# Patient Record
Sex: Female | Born: 1955
Health system: Southern US, Community
[De-identification: ages and names within clinical notes are randomized; demographics above are authoritative.]

## PROBLEM LIST (undated history)

## (undated) DIAGNOSIS — M1990 Unspecified osteoarthritis, unspecified site: Secondary | ICD-10-CM

## (undated) DIAGNOSIS — R233 Spontaneous ecchymoses: Secondary | ICD-10-CM

## (undated) DIAGNOSIS — K5901 Slow transit constipation: Secondary | ICD-10-CM

## (undated) DIAGNOSIS — R001 Bradycardia, unspecified: Secondary | ICD-10-CM

## (undated) DIAGNOSIS — M797 Fibromyalgia: Secondary | ICD-10-CM

## (undated) DIAGNOSIS — R519 Headache, unspecified: Secondary | ICD-10-CM

## (undated) DIAGNOSIS — R609 Edema, unspecified: Secondary | ICD-10-CM

## (undated) DIAGNOSIS — K589 Irritable bowel syndrome without diarrhea: Secondary | ICD-10-CM

## (undated) DIAGNOSIS — E1121 Type 2 diabetes mellitus with diabetic nephropathy: Secondary | ICD-10-CM

## (undated) DIAGNOSIS — I9589 Other hypotension: Secondary | ICD-10-CM

## (undated) DIAGNOSIS — R51 Headache: Secondary | ICD-10-CM

## (undated) DIAGNOSIS — J4530 Mild persistent asthma, uncomplicated: Secondary | ICD-10-CM

## (undated) DIAGNOSIS — I83813 Varicose veins of bilateral lower extremities with pain: Secondary | ICD-10-CM

## (undated) DIAGNOSIS — M791 Myalgia, unspecified site: Secondary | ICD-10-CM

## (undated) DIAGNOSIS — R238 Other skin changes: Secondary | ICD-10-CM

## (undated) DIAGNOSIS — F419 Anxiety disorder, unspecified: Secondary | ICD-10-CM

## (undated) DIAGNOSIS — R911 Solitary pulmonary nodule: Secondary | ICD-10-CM

## (undated) DIAGNOSIS — Z87448 Personal history of other diseases of urinary system: Secondary | ICD-10-CM

## (undated) DIAGNOSIS — N83209 Unspecified ovarian cyst, unspecified side: Secondary | ICD-10-CM

## (undated) DIAGNOSIS — K219 Gastro-esophageal reflux disease without esophagitis: Secondary | ICD-10-CM

## (undated) DIAGNOSIS — I10 Essential (primary) hypertension: Secondary | ICD-10-CM

## (undated) DIAGNOSIS — D369 Benign neoplasm, unspecified site: Secondary | ICD-10-CM

## (undated) DIAGNOSIS — I219 Acute myocardial infarction, unspecified: Secondary | ICD-10-CM

## (undated) DIAGNOSIS — T7840XA Allergy, unspecified, initial encounter: Secondary | ICD-10-CM

## (undated) DIAGNOSIS — M79669 Pain in unspecified lower leg: Secondary | ICD-10-CM

## (undated) DIAGNOSIS — R600 Localized edema: Secondary | ICD-10-CM

## (undated) HISTORY — DX: Personal history of other diseases of urinary system: Z87.448

## (undated) HISTORY — DX: Unspecified ovarian cyst, unspecified side: N83.209

## (undated) HISTORY — DX: Gastro-esophageal reflux disease without esophagitis: K21.9

## (undated) HISTORY — DX: Other hypotension: I95.89

## (undated) HISTORY — DX: Headache: R51

## (undated) HISTORY — DX: Type 2 diabetes mellitus with diabetic nephropathy: E11.21

## (undated) HISTORY — DX: Myalgia, unspecified site: M79.10

## (undated) HISTORY — DX: Pain in unspecified lower leg: M79.669

## (undated) HISTORY — DX: Varicose veins of bilateral lower extremities with pain: I83.813

## (undated) HISTORY — DX: Other skin changes: R23.8

## (undated) HISTORY — DX: Bradycardia, unspecified: R00.1

## (undated) HISTORY — DX: Anxiety disorder, unspecified: F41.9

## (undated) HISTORY — DX: Headache, unspecified: R51.9

## (undated) HISTORY — DX: Fibromyalgia: M79.7

## (undated) HISTORY — PX: TONSILLECTOMY: SUR1361

## (undated) HISTORY — DX: Spontaneous ecchymoses: R23.3

## (undated) HISTORY — DX: Unspecified osteoarthritis, unspecified site: M19.90

## (undated) HISTORY — DX: Localized edema: R60.0

## (undated) HISTORY — PX: BUNIONECTOMY: SHX129

## (undated) HISTORY — DX: Mild persistent asthma, uncomplicated: J45.30

## (undated) HISTORY — DX: Morbid (severe) obesity due to excess calories: E66.01

## (undated) HISTORY — DX: Edema, unspecified: R60.9

## (undated) HISTORY — DX: Essential (primary) hypertension: I10

## (undated) HISTORY — PX: TUBAL LIGATION: SHX77

## (undated) HISTORY — PX: OTHER SURGICAL HISTORY: SHX169

## (undated) HISTORY — PX: CHOLECYSTECTOMY: SHX55

## (undated) HISTORY — DX: Allergy, unspecified, initial encounter: T78.40XA

## (undated) HISTORY — DX: Irritable bowel syndrome, unspecified: K58.9

## (undated) HISTORY — DX: Slow transit constipation: K59.01

## (undated) HISTORY — DX: Solitary pulmonary nodule: R91.1

## (undated) HISTORY — PX: GALLBLADDER SURGERY: SHX652

## (undated) HISTORY — PX: CARDIAC CATHETERIZATION: SHX172

## (undated) HISTORY — DX: Benign neoplasm, unspecified site: D36.9

## (undated) HISTORY — DX: Acute myocardial infarction, unspecified: I21.9

---

## 1998-03-13 ENCOUNTER — Other Ambulatory Visit: Admission: RE | Admit: 1998-03-13 | Discharge: 1998-03-13 | Payer: Self-pay | Admitting: Obstetrics and Gynecology

## 1998-03-16 ENCOUNTER — Ambulatory Visit (HOSPITAL_COMMUNITY): Admission: RE | Admit: 1998-03-16 | Discharge: 1998-03-16 | Payer: Self-pay | Admitting: Neurosurgery

## 1998-03-16 ENCOUNTER — Encounter: Payer: Self-pay | Admitting: Neurosurgery

## 1998-03-30 ENCOUNTER — Other Ambulatory Visit: Admission: RE | Admit: 1998-03-30 | Discharge: 1998-03-30 | Payer: Self-pay | Admitting: Obstetrics and Gynecology

## 1999-05-01 ENCOUNTER — Other Ambulatory Visit: Admission: RE | Admit: 1999-05-01 | Discharge: 1999-05-01 | Payer: Self-pay | Admitting: Obstetrics and Gynecology

## 2000-05-05 ENCOUNTER — Other Ambulatory Visit: Admission: RE | Admit: 2000-05-05 | Discharge: 2000-05-05 | Payer: Self-pay | Admitting: Obstetrics and Gynecology

## 2001-01-21 HISTORY — PX: VAGINAL HYSTERECTOMY: SUR661

## 2001-02-25 ENCOUNTER — Other Ambulatory Visit: Admission: RE | Admit: 2001-02-25 | Discharge: 2001-02-25 | Payer: Self-pay | Admitting: Obstetrics and Gynecology

## 2001-04-16 ENCOUNTER — Encounter: Payer: Self-pay | Admitting: Family Medicine

## 2001-04-16 ENCOUNTER — Encounter: Admission: RE | Admit: 2001-04-16 | Discharge: 2001-04-16 | Payer: Self-pay | Admitting: Family Medicine

## 2001-10-26 ENCOUNTER — Encounter (INDEPENDENT_AMBULATORY_CARE_PROVIDER_SITE_OTHER): Payer: Self-pay | Admitting: Specialist

## 2001-10-26 ENCOUNTER — Inpatient Hospital Stay (HOSPITAL_COMMUNITY): Admission: RE | Admit: 2001-10-26 | Discharge: 2001-10-27 | Payer: Self-pay | Admitting: Obstetrics and Gynecology

## 2001-11-24 ENCOUNTER — Encounter: Admission: RE | Admit: 2001-11-24 | Discharge: 2001-11-24 | Payer: Self-pay | Admitting: Family Medicine

## 2001-11-24 ENCOUNTER — Encounter: Payer: Self-pay | Admitting: Family Medicine

## 2002-12-01 ENCOUNTER — Other Ambulatory Visit: Admission: RE | Admit: 2002-12-01 | Discharge: 2002-12-01 | Payer: Self-pay | Admitting: Obstetrics and Gynecology

## 2003-08-12 ENCOUNTER — Ambulatory Visit (HOSPITAL_COMMUNITY): Admission: RE | Admit: 2003-08-12 | Discharge: 2003-08-12 | Payer: Self-pay | Admitting: Orthopedic Surgery

## 2003-08-12 ENCOUNTER — Ambulatory Visit (HOSPITAL_BASED_OUTPATIENT_CLINIC_OR_DEPARTMENT_OTHER): Admission: RE | Admit: 2003-08-12 | Discharge: 2003-08-12 | Payer: Self-pay | Admitting: Orthopedic Surgery

## 2003-12-26 ENCOUNTER — Other Ambulatory Visit: Admission: RE | Admit: 2003-12-26 | Discharge: 2003-12-26 | Payer: Self-pay | Admitting: Obstetrics and Gynecology

## 2004-10-03 ENCOUNTER — Ambulatory Visit: Payer: Self-pay | Admitting: Hematology & Oncology

## 2005-01-10 ENCOUNTER — Other Ambulatory Visit: Admission: RE | Admit: 2005-01-10 | Discharge: 2005-01-10 | Payer: Self-pay | Admitting: Obstetrics and Gynecology

## 2005-07-19 ENCOUNTER — Ambulatory Visit: Payer: Self-pay | Admitting: Hematology & Oncology

## 2005-07-23 LAB — CBC WITH DIFFERENTIAL/PLATELET
BASO%: 0.1 % (ref 0.0–2.0)
Basophils Absolute: 0 10*3/uL (ref 0.0–0.1)
EOS%: 0 % (ref 0.0–7.0)
Eosinophils Absolute: 0 10*3/uL (ref 0.0–0.5)
HCT: 42.5 % (ref 34.8–46.6)
HGB: 14.6 g/dL (ref 11.6–15.9)
LYMPH%: 7.1 % — ABNORMAL LOW (ref 14.0–48.0)
MCH: 31.9 pg (ref 26.0–34.0)
MCHC: 34.5 g/dL (ref 32.0–36.0)
MCV: 92.5 fL (ref 81.0–101.0)
MONO#: 0.5 10*3/uL (ref 0.1–0.9)
MONO%: 2.7 % (ref 0.0–13.0)
NEUT#: 16.7 10*3/uL — ABNORMAL HIGH (ref 1.5–6.5)
NEUT%: 90.1 % — ABNORMAL HIGH (ref 39.6–76.8)
Platelets: 421 10*3/uL — ABNORMAL HIGH (ref 145–400)
RBC: 4.59 10*6/uL (ref 3.70–5.32)
RDW: 13.4 % (ref 11.3–14.5)
WBC: 18.5 10*3/uL — ABNORMAL HIGH (ref 3.9–10.0)
lymph#: 1.3 10*3/uL (ref 0.9–3.3)

## 2005-07-23 LAB — COMPREHENSIVE METABOLIC PANEL
ALT: 14 U/L (ref 0–40)
AST: 16 U/L (ref 0–37)
Albumin: 4.2 g/dL (ref 3.5–5.2)
Alkaline Phosphatase: 52 U/L (ref 39–117)
BUN: 20 mg/dL (ref 6–23)
CO2: 27 mEq/L (ref 19–32)
Calcium: 9.7 mg/dL (ref 8.4–10.5)
Chloride: 99 mEq/L (ref 96–112)
Creatinine, Ser: 0.92 mg/dL (ref 0.40–1.20)
Glucose, Bld: 112 mg/dL — ABNORMAL HIGH (ref 70–99)
Potassium: 3.3 mEq/L — ABNORMAL LOW (ref 3.5–5.3)
Sodium: 140 mEq/L (ref 135–145)
Total Bilirubin: 0.9 mg/dL (ref 0.3–1.2)
Total Protein: 7.2 g/dL (ref 6.0–8.3)

## 2008-11-28 ENCOUNTER — Encounter: Admission: RE | Admit: 2008-11-28 | Discharge: 2008-11-28 | Payer: Self-pay | Admitting: Orthopaedic Surgery

## 2010-06-08 NOTE — Discharge Summary (Signed)
NAME:  Andrea White, Andrea White NO.:  [id number]   MEDICAL RECORD NO.:  [id number]                   PATIENT TYPE:   LOCATION:                                       FACILITY:   PHYSICIAN:  Dede Query. Rivard, M.D.              DATE OF BIRTH:   DATE OF ADMISSION:  10/26/2001  DATE OF DISCHARGE:  10/27/2001                                 DISCHARGE SUMMARY   DISCHARGE DIAGNOSES:  1. Symptomatic uterine fibroids.  2. Adenomyosis.  3. Dysfunctional uterine bleeding.  4. Dysmenorrhea.  5. Dyspareunia.   OPERATION:  On the date of admission, the patient underwent an open  laparoscopy with a laparoscopic-assisted vaginal hysterectomy, tolerating  procedure well.  During the procedure, the patient was found to have a  fibroid uterus which was upper limits of normal size with a posterior  fibroid along with normal-appearing tubes and ovaries.   HISTORY AND PHYSICAL EXAMINATION:  The patient is a 55 year old married  white female gravida 3, para 1 who presents for hysterectomy due to  dysmenorrhea, dyspareunia, and dysfunctional uterine bleeding.  Please see  the patient's dictated History and Physical Examination for details.   PHYSICAL EXAMINATION:  VITAL SIGNS:  Blood pressure 120/80, weight 237  pounds.  GENERAL:  Was within normal limits.  GYNECOLOGIC EXAMINATION:  Normal external genitalia with a normal vagina,  cervix with an nabothian cyst on the anterior lip measuring 1 cm.  Uterus is  slightly increased in size, tender and mobile.  Adnexa without any palpable  masses.   HOSPITAL COURSE:  On the date of admission, the patient underwent  aforementioned procedure tolerating it well.  Her postoperative course was  unremarkable with the patient resuming bowel and bladder function by postop  day #1 and deemed ready for discharge home.  Postop hemoglobin 12.3 (preop  hemoglobin 12.2).   DISCHARGE MEDICATIONS:  1. Tylox 1-2 tablets every 4-6 hours as needed  for pain.  2. Ibuprofen 600 mg one tablet with food every 6 hours for five days, then     as needed for pain.  3. Phenergan 25 mg one tablet four times daily as needed for nausea.  4. Colace 100 mg one tablet twice daily until bowel movements are regular.  5. The patient also was instructed to resume her prehospital medications.   FOLLOW UP:  The patient is scheduled for a six-week postoperative exam with  Dr. Cletis Media on December 07, 2001 at 3 p.m. at [facility].   DISCHARGE INSTRUCTIONS:  The patient was given a copy of [facility] postoperative instruction sheet.  She was  additionally advised to avoid driving for two weeks, heavy lifting for four  weeks, and intercourse for six weeks.  The patient's diet is without  restrictions.    FINAL PATHOLOGY:  Uterus:  Benign cervix with chronic cervicitis, nabothian  cyst and benign  intracervical polyp, benign secretory phase endometrium,  leiomyomata, and adenomyosis.     [place] J. Elizebeth Koller.                    Dede Query Rivard, M.D.    EJP/MEDQ  D:  12/01/2001  T:  12/02/2001  Job:  [id number]

## 2010-06-08 NOTE — Op Note (Signed)
NAME:  Andrea White, Andrea White                         ACCOUNT NO.:  [id number]   MEDICAL RECORD NO.:  [id number]                   PATIENT TYPE:  INP   LOCATION:  9399                                 FACILITY:  [facility]   PHYSICIAN:  Dede Query. Rivard, M.D.              DATE OF BIRTH:  24-Nov-1955   DATE OF PROCEDURE:  10/26/2001  DATE OF DISCHARGE:                                 OPERATIVE REPORT   PREOPERATIVE DIAGNOSES:  Uterine fibroids with dyspareunia, dysmenorrhea,  and dysfunctional uterine bleeding.   POSTOPERATIVE DIAGNOSES:  Uterine fibroids with dyspareunia, dysmenorrhea,  and dysfunctional uterine bleeding.   ANESTHESIA:  General.   PROCEDURE:  Laparoscopy assisted vaginal hysterectomy.   SURGEON:  Dede Query. Rivard, M.D.   ASSISTANTJon Billings. Alfred Levins BLOOD LOSS:  250 cc.   PROCEDURE:  After being informed of the planned procedure with possible  complications including bleeding, infection, injury to bowels, bladders, or  ureters, need for laparotomy, informed consent was obtained.  The patient  was taken to OR number four.  Given general anesthesia with endotracheal  intubation.  She was placed in a lithotomy position, prepped and draped in a  sterile fashion.  A speculum was inserted.  Anterior lip of the cervix was  grasped with a tenaculum.  An acorn manipulator was inserted.  We then  proceeded with infiltration of the umbilical area with 10 cc of Marcaine  0.25 and proceeded with a semi elliptical incision.  This was brought down  bluntly to the fascia which was grasped with two Kocher forceps.  The fascia  was incised with Mayo scissors and the peritoneum was entered bluntly.  We  placed a running suture around the fascia with a 0 Vicryl and a Hasson  trocar was inserted and held with the previously placed suture.  We  insufflated pneumoperitoneum with CO2 at a maximum pressure of 14 mmHg which  was brought down to 12 mmHg due to the patient's  intolerance to the  pneumoperitoneum.  This allowed [place] complete visualization of the  abdominopelvic cavity.  Two 5 mm trocars were inserted in the right and left  lower quadrant under direct visualization after infiltration with Marcaine  0.25 5 cc each.  Observation:  Anterior cul-de-sac is normal.  Posterior cul-  de-sac is normal.  Tubes are normal other than the previous tubal ligation.  Two ovaries are normal.  The uterus is somewhat enlarged with a posterior  fibroid measuring 5 cm in the lower third of the uterine body.  We then  proceed with the initial part of the laparoscopy assisted vaginal  hysterectomy and using the tripolar cauterized and sectioned both round  ligaments.  We then cauterized and sectioned both utero-ovarian ligaments.  This allows [place] to sharply open the broad ligament and develop a bladder  flap.  We irrigate with warm saline, note a complete hemostasis, and proceed  with the vaginal tying.  The vaginal mucosa is infiltrated with lidocaine  1%, epinephrine 1:100,000 using 15 cc in the circumferential manner.  We  opened this mucosa with knife in the circumferential manner and bluntly  dissect anterior and posterior cul-de-sac.  The bladder is somewhat adherent  to the uterus but the posterior cul-de-sac is easily opened with scissors.  This allows [place] to isolate both uterosacral ligaments on a Roger's clamp,  section, and sutured with a transfixed suture of 0 Vicryl kept for future  suspension.  Uterine arteries are then isolated, clamped, sectioned, and  sutured with 0 Vicryl.  The posterior fibroid is then grasped with a thyroid  clamp and bluntly removed which allows [place] to deliver the uterus through the  posterior cul-de-sac.  The last pedicles on each side containing broad  ligaments are clamped, sectioned, and sutured with a free tie suture of  chromic.  The uterus is removed entirely.  We then evaluate hemostasis and  complete the hemostasis on the  uterosacral ligaments on each side with a  figure-of-eight stitch of 0 Vicryl.  The posterior vaginal edge is sutured  with a running locked suture of 0 Vicryl.  A Moskowitz suture of 0 Vicryl is  placed to close the posterior cul-de-sac and two 0 Vicryl suture of  suspension are placed through the posterior vaginal mucosa, uterosacral  ligament, anterior peritoneum, posterior peritoneum, and posterior vaginal  mucosa on each side in the same manner.  We are now able to close the  [facility] suture, suture the vaginal cuff with figure-of-eight stitches of 0  Vicryl, and tie our suspension sutures.  Hemostasis is adequate.  The vagina  is packed with a 2 inch vaginal pack with Estrace cream.  We then return in  laparoscopy time, reinsufflate the pneumoperitoneum at 12 mmHg and  abundantly irrigate with warm saline.  We see a few sites of oozing on the  peritoneal edge on the right side which is controlled with cauterization.  We irrigate again abundantly with warm saline and note a complete and  adequate hemostasis.  Both ureters are revisualized and normal.  We then  remove the two 5 mm trocars under direct visualization and note some oozing  on the right side which is controlled with bipolar cauterization.  We  evacuate the pneumoperitoneum, remove the Hasson trocar, and tie the  previously placed fascial suture after ensuring that no bowel loop is caught  in the suture.  Skin is then closed with subcuticular suture of 4-0 Vicryl  and Steri-Strips.  Instruments and sponge count is complete x2.  Estimated  blood loss 250 cc.  The procedure is very well tolerated by the patient who  is taken to recovery room in a well and stable condition.                                               Dede Query Rivard, M.D.    SAR/MEDQ  D:  10/26/2001  T:  10/26/2001  Job:  [id number]

## 2010-06-08 NOTE — Op Note (Signed)
NAME:  Andrea White, Andrea White NO.:  [id number]   MEDICAL RECORD NO.:  [id number]                   PATIENT TYPE:  AMB   LOCATION:  [place]                                  FACILITY:  [facility]   PHYSICIAN:  Yvette Rack., M.D.             DATE OF BIRTH:  April 19, 1955   DATE OF PROCEDURE:  08/12/2003  DATE OF DISCHARGE:                                 OPERATIVE REPORT   INDICATIONS FOR PROCEDURE:  This patient had an uneventful carpal tunnel but  due to some retention of pain in the wrist, it was noted that she possibly  had a retained suture.  Attempts to get this out in the office met with a  fair amount of pain from the patient's standpoint and she was given the  option for continuing this in the office, but due to the pain, it was felt a  reasonable request to have this done as an outpatient.   PREOPERATIVE DIAGNOSIS:  Retained suture, left wrist.   POSTOPERATIVE DIAGNOSIS:  Retained suture, left wrist.   OPERATION:  Removal of suture (foreign body) left wrist.   SURGEON:  Lockie Pares, M.D.   ANESTHESIA:  MAC.   TOURNIQUET TIME:  Approximately 5 minutes.   DESCRIPTION OF PROCEDURE:  After heavy MAC anesthesia, placement of the  tourniquet, and exsanguination of the wrist, a small incision portion of the  incision was opened over almost an inch.  The suture was immediately visible  deep.  The knot had actually grown under the skin.  It was cut and the whole  suture was retrieved in its entirety.  Some slight reaction around this area  was noted.  The wound was irrigated and closed with a very wide mattress  suture.  A lightly compressive sterile dressing was applied.  The patient  was taken to the recovery room in stable condition.                                               Yvette Rack., M.D.    WDC/MEDQ  D:  08/12/2003  T:  08/13/2003  Job:  [id number]

## 2010-06-08 NOTE — H&P (Signed)
NAME:  Andrea White, Andrea White                         ACCOUNT NO.:  [id number]   MEDICAL RECORD NO.:  [id number]                   PATIENT TYPE:  INP   LOCATION:  NA                                   FACILITY:  [facility]   PHYSICIAN:  Katharine Look A. Rivard, M.D.              DATE OF BIRTH:  1955-12-25   DATE OF ADMISSION:  10/26/2001  DATE OF DISCHARGE:                                HISTORY & PHYSICAL   REASON FOR ADMISSION:  Dysfunctional uterine bleeding with chronic pelvic  pain and uterine fibroids.   HISTORY OF PRESENT ILLNESS:  This is a 55 year old married white female,  gravida 3, para 1, who has been known for oligomenorrhea for many years, who  responded very well to __________ 5 mg q.d., days 12-25, with regular cycles  every 28 days, lasting four days with a normal flow.  She reported  dysmenorrhea, which responded well to Advil.  In July 2003 the patient  reported a new onset of dyspareunia, despite the use of lubricant, and  despite the absence of hot flashes.  Her cycles remained regular and normal.  An ultrasound performed in August 2003 revealed a pedunculated fibroid,  possibly degenerating, measuring 3.1 x 2.6 x 3.5 cm located on the anterior  wall of the uterus.  Another small fibroid, measuring 1.3 x 0.8 x 0.8 cm was  identified mid body anterior wall.  The endometrial canal at that time was  measuring 1.6 cm with the possibility of a submucosal fibroid.  Both ovaries  were difficult to see.  A sonohysterogram revealed two small polyps with a  normal endometrium.  The patient now reports pelvic pain radiating to her  back of an intensity of 5-6/10 and a worsening of her dysmenorrhea, which  requires 1600 mg of Advil a day.  She is now requesting a definitive  treatment.  An endometrial biopsy performed preoperatively on September 22  revealed a benign proliferative endometrium.  Her last Pap smear is February  2003 and was within normal limits.  Last mammogram September 2003  was  normal.   REVIEW OF SYSTEMS:  Review of systems reveals:  CONSTITUTIONAL:  Negative.  EYES:  Negative.  EARS, NOSE, AND THROAT:  Negative.  CARDIOVASCULAR:  Negative.  RESPIRATORY:  Negative.  GASTROINTESTINAL:  Negative.  GENITOURINARY:  Negative.  MUSCULOSKELETAL:  Negative.  NEUROLOGICAL:  Occasional migraines, awakened with headaches.  PSYCHIATRIC:  Negative.  ENDOCRINE:  Negative.   PAST MEDICAL HISTORY:  1. Borderline hypertension.  2. Herniated disk, L5-S1.  3. Status post tonsillectomy.  4. Gastroesophageal reflux disease.  5. Cesarean section x1.  6. Bilateral tubal ligation.   CURRENT MEDICATIONS:  1. Hydrochlorothiazide 50 mg q.d.  2. Prilosec q.d.  3. Metoprolol 100 mg q.d.   ALLERGIES:  Known allergy to SULFA.   SOCIAL HISTORY:  Married, nonsmoker, is a Agricultural engineer.   FAMILY HISTORY:  Father known for coronary artery disease  and hypertension.  Maternal aunt with breast insert at 28.  Sister with diabetes.   PHYSICAL EXAMINATION:  VITAL SIGNS:  Blood pressure 120/80, current weight  237 pounds.  HEENT:  Negative.  RESPIRATORY:  Normal.  CARDIOVASCULAR:  Auscultation normal.  ABDOMEN:  Soft, nontender, no hernia.  Absence of hepatosplenomegaly.  LYMPH NODES:  Negative.  SKIN:  Normal.  NEUROLOGICAL:  Well-oriented in time, place, and person with a normal mood  and affect.  EXTREMITIES:  Negative.  GYN:  Exam reveals normal external genitalia, normal vagina.  Cervix with a  nabothian cyst on the anterior lip measuring 1 cm.  Uterus is slightly  increased in size and tender, mobile.  Adnexa:  No masses felt.   ASSESSMENT:  New onset of dyspareunia with worsening of dysmenorrhea in  patient known with fibroids and dysfunctional uterine bleeding.  The patient  is requesting definitive treatment.   PLAN:  The patient is admitted to undergo laparoscopy-assisted vaginal  hysterectomy.  Procedure as well as possible complications including  bleeding,  infection, injury to bowels, bladder, or ureter, need for  laparotomy, are well discussed with the patient, as well as earlier  menopausal symptoms due to hysterectomy.  Informed consent is obtained.                                               Dede Query Rivard, M.D.    SAR/MEDQ  D:  10/25/2001  T:  10/25/2001  Job:  [id number]

## 2011-05-15 LAB — HM COLONOSCOPY

## 2011-06-10 DIAGNOSIS — I252 Old myocardial infarction: Secondary | ICD-10-CM | POA: Insufficient documentation

## 2011-06-10 DIAGNOSIS — IMO0002 Reserved for concepts with insufficient information to code with codable children: Secondary | ICD-10-CM

## 2011-06-10 DIAGNOSIS — N83209 Unspecified ovarian cyst, unspecified side: Secondary | ICD-10-CM | POA: Insufficient documentation

## 2011-06-18 ENCOUNTER — Ambulatory Visit: Payer: BC Managed Care – HMO

## 2011-06-18 ENCOUNTER — Encounter: Payer: Self-pay | Admitting: Obstetrics and Gynecology

## 2011-06-18 ENCOUNTER — Ambulatory Visit (INDEPENDENT_AMBULATORY_CARE_PROVIDER_SITE_OTHER): Payer: BC Managed Care – HMO | Admitting: Obstetrics and Gynecology

## 2011-06-18 VITALS — BP 112/68 | Ht 62.0 in | Wt 211.0 lb

## 2011-06-18 DIAGNOSIS — N951 Menopausal and female climacteric states: Secondary | ICD-10-CM

## 2011-06-18 DIAGNOSIS — Z01419 Encounter for gynecological examination (general) (routine) without abnormal findings: Secondary | ICD-10-CM

## 2011-06-18 DIAGNOSIS — Z780 Asymptomatic menopausal state: Secondary | ICD-10-CM

## 2011-06-18 DIAGNOSIS — N898 Other specified noninflammatory disorders of vagina: Secondary | ICD-10-CM

## 2011-06-18 LAB — POCT WET PREP (WET MOUNT)

## 2011-06-18 MED ORDER — TERCONAZOLE 80 MG VA SUPP
VAGINAL | Status: DC
Start: 2011-06-18 — End: 2011-12-26

## 2011-06-18 MED ORDER — NYSTATIN-TRIAMCINOLONE 100000-0.1 UNIT/GM-% EX OINT: TOPICAL_OINTMENT | Freq: Three times a day (TID) | CUTANEOUS | Status: DC | PRN

## 2011-06-18 NOTE — Progress Notes (Signed)
The patient is not taking hormone replacement therapy The patient  is not taking a Calcium supplement. Post-menopausal bleeding:no  Last Pap: was normal May  2009 Last mammogram: was normal April  2013 Last DEXA scan : T= 0  Pt never had one  Last colonoscopy:was normal April 2013  Urinary symptoms: none Normal bowel movements: No:  Reports abuse at home: No  Subjective:    Andrea White is a 56 y.o. female G3P1020 who presents for annual exam. S/P LAVH The patient has no complaints today.   The following portions of the patient's history were reviewed and updated as appropriate: allergies, current medications, past family history, past medical history, past social history, past surgical history and problem list.  Review of Systems Pertinent items are noted in HPI. Gastrointestinal:No change in bowel habits, no abdominal pain, no rectal bleeding Genitourinary:negative for dysuria, frequency, hematuria, nocturia and urinary incontinence    Objective:     BP 112/68  Ht 5\' 2"  (1.575 m)  Wt 211 lb (95.709 kg)  BMI 38.59 kg/m2  Weight:  Wt Readings from Last 1 Encounters:  06/18/11 211 lb (95.709 kg)     BMI: Body mass index is 38.59 kg/(m^2). General Appearance: Alert, appropriate appearance for age. No acute distress HEENT: Grossly normal Neck / Thyroid: Supple, no masses, nodes or enlargement Lungs: clear to auscultation bilaterally Back: No CVA tenderness Breast Exam: No masses or nodes.No dimpling, nipple retraction or discharge. Cardiovascular: Regular rate and rhythm. S1, S2, no murmur Gastrointestinal: Soft, non-tender, no masses or organomegaly Pelvic Exam: Vulva normal. Vagina: thick white discharge. Adnexa: normal Rectovaginal: Deferred due to recent normal colonoscopy Lymphatic Exam: Non-palpable nodes in neck, clavicular, axillary, or inguinal regions Skin: no rash or abnormalities Neurologic: Normal gait and speech, no tremor  Psychiatric: Alert and oriented,  appropriate affect.          Assessment:    Normal gyn exam Yeast vaginitis    Plan:    DEXA today  Terazol 3 Mycolog II  Follow-up:  for annual exam

## 2011-12-26 ENCOUNTER — Emergency Department (HOSPITAL_COMMUNITY): Payer: No Typology Code available for payment source

## 2011-12-26 ENCOUNTER — Encounter (HOSPITAL_COMMUNITY): Payer: Self-pay | Admitting: Emergency Medicine

## 2011-12-26 ENCOUNTER — Emergency Department (HOSPITAL_COMMUNITY)
Admission: EM | Admit: 2011-12-26 | Discharge: 2011-12-26 | Disposition: A | Discharge status: Home / Self Care | Payer: No Typology Code available for payment source | Attending: Emergency Medicine | Admitting: Emergency Medicine

## 2011-12-26 DIAGNOSIS — R109 Unspecified abdominal pain: Secondary | ICD-10-CM

## 2011-12-26 DIAGNOSIS — Z8739 Personal history of other diseases of the musculoskeletal system and connective tissue: Secondary | ICD-10-CM | POA: Insufficient documentation

## 2011-12-26 DIAGNOSIS — Y92410 Unspecified street and highway as the place of occurrence of the external cause: Secondary | ICD-10-CM | POA: Insufficient documentation

## 2011-12-26 DIAGNOSIS — Z8742 Personal history of other diseases of the female genital tract: Secondary | ICD-10-CM | POA: Insufficient documentation

## 2011-12-26 DIAGNOSIS — I10 Essential (primary) hypertension: Secondary | ICD-10-CM | POA: Insufficient documentation

## 2011-12-26 DIAGNOSIS — K219 Gastro-esophageal reflux disease without esophagitis: Secondary | ICD-10-CM | POA: Insufficient documentation

## 2011-12-26 DIAGNOSIS — F411 Generalized anxiety disorder: Secondary | ICD-10-CM | POA: Insufficient documentation

## 2011-12-26 DIAGNOSIS — M62838 Other muscle spasm: Secondary | ICD-10-CM | POA: Insufficient documentation

## 2011-12-26 DIAGNOSIS — S73101A Unspecified sprain of right hip, initial encounter: Secondary | ICD-10-CM

## 2011-12-26 DIAGNOSIS — Y939 Activity, unspecified: Secondary | ICD-10-CM | POA: Insufficient documentation

## 2011-12-26 DIAGNOSIS — I252 Old myocardial infarction: Secondary | ICD-10-CM | POA: Insufficient documentation

## 2011-12-26 DIAGNOSIS — M62830 Muscle spasm of back: Secondary | ICD-10-CM

## 2011-12-26 DIAGNOSIS — Z79899 Other long term (current) drug therapy: Secondary | ICD-10-CM | POA: Insufficient documentation

## 2011-12-26 DIAGNOSIS — IMO0002 Reserved for concepts with insufficient information to code with codable children: Secondary | ICD-10-CM | POA: Insufficient documentation

## 2011-12-26 LAB — POCT I-STAT, CHEM 8
BUN: 12 mg/dL (ref 6–23)
Calcium, Ion: 1.1 mmol/L — ABNORMAL LOW (ref 1.12–1.23)
Chloride: 106 mEq/L (ref 96–112)
Creatinine, Ser: 0.9 mg/dL (ref 0.50–1.10)
Glucose, Bld: 91 mg/dL (ref 70–99)
HCT: 40 % (ref 36.0–46.0)
Hemoglobin: 13.6 g/dL (ref 12.0–15.0)
Potassium: 3 mEq/L — ABNORMAL LOW (ref 3.5–5.1)
Sodium: 141 mEq/L (ref 135–145)
TCO2: 23 mmol/L (ref 0–100)

## 2011-12-26 MED ORDER — SODIUM CHLORIDE 0.9 % IV SOLN
Freq: Once | INTRAVENOUS | Status: AC
  Administered 2011-12-26: 13:00:00 via INTRAVENOUS

## 2011-12-26 MED ORDER — DIAZEPAM 5 MG/ML IJ SOLN
5.0000 mg | Freq: Once | INTRAMUSCULAR | Status: AC
  Administered 2011-12-26: 5 mg via INTRAVENOUS
  Filled 2011-12-26: qty 2

## 2011-12-26 MED ORDER — IOHEXOL 300 MG/ML  SOLN
80.0000 mL | Freq: Once | INTRAMUSCULAR | Status: AC | PRN
  Administered 2011-12-26: 80 mL via INTRAVENOUS

## 2011-12-26 MED ORDER — DIAZEPAM 5 MG PO TABS: 5.0000 mg | ORAL_TABLET | Freq: Three times a day (TID) | ORAL | Status: DC | PRN

## 2011-12-26 MED ORDER — OXYCODONE-ACETAMINOPHEN 5-325 MG PO TABS: 1.0000 | ORAL_TABLET | ORAL | Status: DC | PRN

## 2011-12-26 MED ORDER — FENTANYL CITRATE 0.05 MG/ML IJ SOLN
50.0000 ug | Freq: Once | INTRAMUSCULAR | Status: AC
  Administered 2011-12-26: 14:00:00 via INTRAVENOUS
  Filled 2011-12-26: qty 2

## 2011-12-26 NOTE — ED Notes (Signed)
Pt A&Ox4, ambulatory, nad.
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
## 2011-12-26 NOTE — ED Provider Notes (Signed)
Patient initially seen in triage by Dr. Dorna Mai, moved to CDU to wait for CT abd results. Delay due to labs not drawn, Cr needed for scan. Patient was given IV valium and O2 sat dropped to mid-80's and blood pressure less than [id number] systolic. She remained arousable. Recheck after 10 minutes, she is awake, alert, BP 113, no breathing difficulties. Pain medication given, lab called for I-stat draw.  3:15 - CT negative for abdominal abnormalities or osseous abnormalities. She feels improved, moving more easily in the bed. Stable for discharge.   [name], PA-C 12/26/11 1546

## 2011-12-26 NOTE — ED Notes (Signed)
Patient transported to CT 

## 2011-12-26 NOTE — ED Notes (Signed)
After receiving 5mg  IV valium pt oxygen level decreased into 80s, place pt on o2 increased to 100%

## 2011-12-26 NOTE — ED Notes (Signed)
Pt refuses bedpan and insists walking with assistance to bathroom.

## 2011-12-26 NOTE — ED Provider Notes (Signed)
Medical screening examination/treatment/procedure(s) were conducted as a shared visit with non-physician practitioner(s) and myself.  I personally evaluated the patient during the encounter  Primary assessment and treatment by me.  PAC note for holding status in CDU awaiting radiographs.    Saddie Benders. Bejamin Hackbart, MD 12/26/11 1615
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
## 2011-12-26 NOTE — ED Notes (Signed)
Phlebotomy came to redraw I-stat

## 2012-02-17 ENCOUNTER — Other Ambulatory Visit: Payer: Self-pay | Admitting: Orthopaedic Surgery

## 2012-02-17 DIAGNOSIS — M5136 Other intervertebral disc degeneration, lumbar region: Secondary | ICD-10-CM

## 2012-02-22 ENCOUNTER — Ambulatory Visit
Admission: RE | Admit: 2012-02-22 | Discharge: 2012-02-22 | Disposition: A | Discharge status: Home / Self Care | Payer: Medicare Other | Source: Ambulatory Visit | Attending: Orthopaedic Surgery | Admitting: Orthopaedic Surgery

## 2012-02-22 ENCOUNTER — Other Ambulatory Visit: Payer: Medicare Other

## 2012-02-22 DIAGNOSIS — M5136 Other intervertebral disc degeneration, lumbar region: Secondary | ICD-10-CM

## 2013-03-31 ENCOUNTER — Ambulatory Visit (INDEPENDENT_AMBULATORY_CARE_PROVIDER_SITE_OTHER): Payer: Medicare Other

## 2013-03-31 ENCOUNTER — Ambulatory Visit: Payer: Medicare Other

## 2013-03-31 VITALS — BP 115/79 | HR 76 | Resp 18

## 2013-03-31 DIAGNOSIS — M1990 Unspecified osteoarthritis, unspecified site: Secondary | ICD-10-CM

## 2013-03-31 DIAGNOSIS — M21079 Valgus deformity, not elsewhere classified, unspecified ankle: Secondary | ICD-10-CM

## 2013-03-31 DIAGNOSIS — M778 Other enthesopathies, not elsewhere classified: Secondary | ICD-10-CM

## 2013-03-31 DIAGNOSIS — R52 Pain, unspecified: Secondary | ICD-10-CM

## 2013-03-31 DIAGNOSIS — Q742 Other congenital malformations of lower limb(s), including pelvic girdle: Secondary | ICD-10-CM

## 2013-03-31 DIAGNOSIS — Q6689 Other  specified congenital deformities of feet: Secondary | ICD-10-CM

## 2013-03-31 DIAGNOSIS — M216X9 Other acquired deformities of unspecified foot: Secondary | ICD-10-CM

## 2013-03-31 DIAGNOSIS — R269 Unspecified abnormalities of gait and mobility: Secondary | ICD-10-CM

## 2013-03-31 DIAGNOSIS — M779 Enthesopathy, unspecified: Secondary | ICD-10-CM

## 2013-03-31 DIAGNOSIS — M7750 Other enthesopathy of unspecified foot: Secondary | ICD-10-CM
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
## 2013-03-31 NOTE — Patient Instructions (Signed)
Osteoarthritis Osteoarthritis is a disease that causes soreness and swelling (inflammation) of a joint. It occurs when the cartilage at the affected joint wears down. Cartilage acts as a cushion, covering the ends of bones where they meet to form a joint. Osteoarthritis is the most common form of arthritis. It often occurs in older people. The joints affected most often by this condition include those in the:  Ends of the fingers.  Thumbs.  Neck.  Lower back.  Knees.  Hips. CAUSES  Over time, the cartilage that covers the ends of bones begins to wear away. This causes bone to rub on bone, producing pain and stiffness in the affected joints.  RISK FACTORS Certain factors can increase your chances of having osteoarthritis, including:  Older age.  Excessive body weight.  Overuse of joints. SIGNS AND SYMPTOMS   Pain, swelling, and stiffness in the joint.  Over time, the joint may lose its normal shape.  Small deposits of bone (osteophytes) may grow on the edges of the joint.  Bits of bone or cartilage can break off and float inside the joint space. This may cause more pain and damage. DIAGNOSIS  Your health care provider will do a physical exam and ask about your symptoms. Various tests may be ordered, such as:  X-rays of the affected joint.  An MRI scan.  Blood tests to rule out other types of arthritis.  Joint fluid tests. This involves using a needle to draw fluid from the joint and examining the fluid under a microscope. TREATMENT  Goals of treatment are to control pain and improve joint function. Treatment plans may include:  A prescribed exercise program that allows for rest and joint relief.  A weight control plan.  Pain relief techniques, such as:  Properly applied heat and cold.  Electric pulses delivered to nerve endings under the skin (transcutaneous electrical nerve stimulation, TENS).  Massage.  Certain nutritional supplements.  Medicines to  control pain, such as:  Acetaminophen.  Nonsteroidal anti-inflammatory drugs (NSAIDs), such as naproxen.  Narcotic or central-acting agents, such as tramadol.  Corticosteroids. These can be given orally or as an injection.  Surgery to reposition the bones and relieve pain (osteotomy) or to remove loose pieces of bone and cartilage. Joint replacement may be needed in advanced states of osteoarthritis. HOME CARE INSTRUCTIONS   Only take over-the-counter or prescription medicines as directed by your health care provider. Take all medicines exactly as instructed.  Maintain a healthy weight. Follow your health care provider's instructions for weight control. This may include dietary instructions.  Exercise as directed. Your health care provider can recommend specific types of exercise. These may include:  Strengthening exercises These are done to strengthen the muscles that support joints affected by arthritis. They can be performed with weights or with exercise bands to add resistance.  Aerobic activities These are exercises, such as brisk walking or low-impact aerobics, that get your heart pumping.  Range-of-motion activities These keep your joints limber.  Balance and agility exercises These help you maintain daily living skills.  Rest your affected joints as directed by your health care provider.  Follow up with your health care provider as directed. SEEK MEDICAL CARE IF:   Your skin turns red.  You develop a rash in addition to your joint pain.  You have worsening joint pain. SEEK IMMEDIATE MEDICAL CARE IF:  You have a significant loss of weight or appetite.  You have a fever along with joint or muscle aches.  You have   night sweats. FOR MORE INFORMATION  National Institute of Arthritis and Musculoskeletal and Skin Diseases: www.niams.nih.gov National Institute on Aging: www.nia.nih.gov American College of Rheumatology: www.rheumatology.org Document Released: 01/07/2005  Document Revised: 10/28/2012 Document Reviewed: 09/14/2012 ExitCare Patient Information 2014 [employer].  

## 2013-06-30 ENCOUNTER — Ambulatory Visit: Payer: Medicare Other

## 2013-07-29 ENCOUNTER — Ambulatory Visit: Payer: Medicare Other

## 2013-09-20 ENCOUNTER — Other Ambulatory Visit: Payer: Self-pay | Admitting: Orthopaedic Surgery

## 2013-09-20 DIAGNOSIS — M5030 Other cervical disc degeneration, unspecified cervical region: Secondary | ICD-10-CM

## 2013-09-29 ENCOUNTER — Ambulatory Visit
Admission: RE | Admit: 2013-09-29 | Discharge: 2013-09-29 | Disposition: A | Discharge status: Home / Self Care | Payer: Medicare Other | Source: Ambulatory Visit | Attending: Orthopaedic Surgery | Admitting: Orthopaedic Surgery

## 2013-09-29 DIAGNOSIS — M5030 Other cervical disc degeneration, unspecified cervical region: Secondary | ICD-10-CM

## 2014-02-04 DIAGNOSIS — Z124 Encounter for screening for malignant neoplasm of cervix: Secondary | ICD-10-CM | POA: Diagnosis not present

## 2014-02-04 DIAGNOSIS — Z01419 Encounter for gynecological examination (general) (routine) without abnormal findings: Secondary | ICD-10-CM | POA: Diagnosis not present

## 2014-02-04 DIAGNOSIS — R7301 Impaired fasting glucose: Secondary | ICD-10-CM | POA: Diagnosis not present

## 2014-02-07 DIAGNOSIS — H5203 Hypermetropia, bilateral: Secondary | ICD-10-CM | POA: Diagnosis not present

## 2014-03-04 DIAGNOSIS — J0100 Acute maxillary sinusitis, unspecified: Secondary | ICD-10-CM | POA: Diagnosis not present

## 2014-03-04 DIAGNOSIS — S61409A Unspecified open wound of unspecified hand, initial encounter: Secondary | ICD-10-CM | POA: Diagnosis not present

## 2014-03-10 DIAGNOSIS — J0100 Acute maxillary sinusitis, unspecified: Secondary | ICD-10-CM | POA: Diagnosis not present

## 2014-03-10 DIAGNOSIS — E780 Pure hypercholesterolemia: Secondary | ICD-10-CM | POA: Diagnosis not present

## 2014-03-10 DIAGNOSIS — R7301 Impaired fasting glucose: Secondary | ICD-10-CM | POA: Diagnosis not present

## 2014-03-10 DIAGNOSIS — I10 Essential (primary) hypertension: Secondary | ICD-10-CM | POA: Diagnosis not present

## 2014-03-10 DIAGNOSIS — E782 Mixed hyperlipidemia: Secondary | ICD-10-CM | POA: Diagnosis not present

## 2014-03-10 DIAGNOSIS — S61409A Unspecified open wound of unspecified hand, initial encounter: Secondary | ICD-10-CM | POA: Diagnosis not present

## 2014-03-16 DIAGNOSIS — M5136 Other intervertebral disc degeneration, lumbar region: Secondary | ICD-10-CM | POA: Diagnosis not present

## 2014-03-16 DIAGNOSIS — Z6836 Body mass index (BMI) 36.0-36.9, adult: Secondary | ICD-10-CM | POA: Diagnosis not present

## 2014-03-16 DIAGNOSIS — M5030 Other cervical disc degeneration, unspecified cervical region: Secondary | ICD-10-CM | POA: Diagnosis not present

## 2014-03-21 DIAGNOSIS — J209 Acute bronchitis, unspecified: Secondary | ICD-10-CM | POA: Diagnosis not present

## 2014-03-21 DIAGNOSIS — J0100 Acute maxillary sinusitis, unspecified: Secondary | ICD-10-CM | POA: Diagnosis not present

## 2014-03-25 DIAGNOSIS — J209 Acute bronchitis, unspecified: Secondary | ICD-10-CM | POA: Diagnosis not present

## 2014-03-28 DIAGNOSIS — R0602 Shortness of breath: Secondary | ICD-10-CM | POA: Diagnosis not present

## 2014-03-28 DIAGNOSIS — R079 Chest pain, unspecified: Secondary | ICD-10-CM | POA: Diagnosis not present

## 2014-03-28 DIAGNOSIS — R05 Cough: Secondary | ICD-10-CM | POA: Diagnosis not present

## 2014-03-28 DIAGNOSIS — J209 Acute bronchitis, unspecified: Secondary | ICD-10-CM | POA: Diagnosis not present

## 2014-04-05 DIAGNOSIS — J4540 Moderate persistent asthma, uncomplicated: Secondary | ICD-10-CM | POA: Diagnosis not present

## 2014-04-18 DIAGNOSIS — J42 Unspecified chronic bronchitis: Secondary | ICD-10-CM | POA: Diagnosis not present

## 2014-04-18 DIAGNOSIS — E669 Obesity, unspecified: Secondary | ICD-10-CM | POA: Diagnosis not present

## 2014-05-23 DIAGNOSIS — J309 Allergic rhinitis, unspecified: Secondary | ICD-10-CM | POA: Diagnosis not present

## 2014-05-23 DIAGNOSIS — I10 Essential (primary) hypertension: Secondary | ICD-10-CM | POA: Diagnosis not present

## 2014-06-09 DIAGNOSIS — R112 Nausea with vomiting, unspecified: Secondary | ICD-10-CM | POA: Diagnosis not present

## 2014-06-09 DIAGNOSIS — J209 Acute bronchitis, unspecified: Secondary | ICD-10-CM | POA: Diagnosis not present

## 2014-06-09 DIAGNOSIS — J9801 Acute bronchospasm: Secondary | ICD-10-CM | POA: Diagnosis not present

## 2014-06-10 DIAGNOSIS — R0602 Shortness of breath: Secondary | ICD-10-CM | POA: Diagnosis not present

## 2014-06-10 DIAGNOSIS — R42 Dizziness and giddiness: Secondary | ICD-10-CM | POA: Diagnosis not present

## 2014-06-10 DIAGNOSIS — R112 Nausea with vomiting, unspecified: Secondary | ICD-10-CM | POA: Diagnosis not present

## 2014-06-28 DIAGNOSIS — E109 Type 1 diabetes mellitus without complications: Secondary | ICD-10-CM | POA: Diagnosis not present

## 2014-06-28 DIAGNOSIS — I10 Essential (primary) hypertension: Secondary | ICD-10-CM | POA: Diagnosis not present

## 2014-06-28 DIAGNOSIS — E780 Pure hypercholesterolemia: Secondary | ICD-10-CM | POA: Diagnosis not present

## 2014-07-01 DIAGNOSIS — I10 Essential (primary) hypertension: Secondary | ICD-10-CM | POA: Diagnosis not present

## 2014-07-01 DIAGNOSIS — E119 Type 2 diabetes mellitus without complications: Secondary | ICD-10-CM | POA: Diagnosis not present

## 2014-07-01 DIAGNOSIS — D492 Neoplasm of unspecified behavior of bone, soft tissue, and skin: Secondary | ICD-10-CM | POA: Diagnosis not present

## 2014-07-01 DIAGNOSIS — E780 Pure hypercholesterolemia: Secondary | ICD-10-CM | POA: Diagnosis not present

## 2014-07-01 DIAGNOSIS — R10814 Left lower quadrant abdominal tenderness: Secondary | ICD-10-CM | POA: Diagnosis not present

## 2014-07-12 DIAGNOSIS — W57XXXA Bitten or stung by nonvenomous insect and other nonvenomous arthropods, initial encounter: Secondary | ICD-10-CM | POA: Diagnosis not present

## 2014-07-12 DIAGNOSIS — S80862A Insect bite (nonvenomous), left lower leg, initial encounter: Secondary | ICD-10-CM | POA: Diagnosis not present

## 2014-08-01 DIAGNOSIS — R05 Cough: Secondary | ICD-10-CM | POA: Diagnosis not present

## 2014-08-08 DIAGNOSIS — J209 Acute bronchitis, unspecified: Secondary | ICD-10-CM | POA: Diagnosis not present

## 2014-08-09 DIAGNOSIS — D2339 Other benign neoplasm of skin of other parts of face: Secondary | ICD-10-CM | POA: Diagnosis not present

## 2014-10-11 DIAGNOSIS — E780 Pure hypercholesterolemia: Secondary | ICD-10-CM | POA: Diagnosis not present

## 2014-10-11 DIAGNOSIS — I10 Essential (primary) hypertension: Secondary | ICD-10-CM | POA: Diagnosis not present

## 2014-10-11 DIAGNOSIS — R238 Other skin changes: Secondary | ICD-10-CM | POA: Diagnosis not present

## 2014-10-11 DIAGNOSIS — E119 Type 2 diabetes mellitus without complications: Secondary | ICD-10-CM | POA: Diagnosis not present

## 2014-10-11 DIAGNOSIS — R8299 Other abnormal findings in urine: Secondary | ICD-10-CM | POA: Diagnosis not present

## 2014-10-11 DIAGNOSIS — R3911 Hesitancy of micturition: Secondary | ICD-10-CM | POA: Diagnosis not present

## 2014-11-01 DIAGNOSIS — Z01419 Encounter for gynecological examination (general) (routine) without abnormal findings: Secondary | ICD-10-CM | POA: Diagnosis not present

## 2014-11-01 DIAGNOSIS — R6882 Decreased libido: Secondary | ICD-10-CM | POA: Diagnosis not present

## 2014-11-01 DIAGNOSIS — Z1231 Encounter for screening mammogram for malignant neoplasm of breast: Secondary | ICD-10-CM | POA: Diagnosis not present

## 2014-11-01 DIAGNOSIS — N898 Other specified noninflammatory disorders of vagina: Secondary | ICD-10-CM | POA: Diagnosis not present

## 2014-11-01 DIAGNOSIS — Z6837 Body mass index (BMI) 37.0-37.9, adult: Secondary | ICD-10-CM | POA: Diagnosis not present

## 2014-11-17 DIAGNOSIS — I872 Venous insufficiency (chronic) (peripheral): Secondary | ICD-10-CM | POA: Diagnosis not present

## 2014-11-17 DIAGNOSIS — I10 Essential (primary) hypertension: Secondary | ICD-10-CM | POA: Diagnosis not present

## 2014-11-17 DIAGNOSIS — M1990 Unspecified osteoarthritis, unspecified site: Secondary | ICD-10-CM | POA: Diagnosis not present

## 2014-12-29 DIAGNOSIS — M791 Myalgia: Secondary | ICD-10-CM | POA: Diagnosis not present

## 2014-12-29 DIAGNOSIS — M47892 Other spondylosis, cervical region: Secondary | ICD-10-CM | POA: Diagnosis not present

## 2014-12-29 DIAGNOSIS — Z6836 Body mass index (BMI) 36.0-36.9, adult: Secondary | ICD-10-CM | POA: Diagnosis not present

## 2015-01-10 DIAGNOSIS — J018 Other acute sinusitis: Secondary | ICD-10-CM | POA: Diagnosis not present

## 2015-01-10 DIAGNOSIS — J4541 Moderate persistent asthma with (acute) exacerbation: Secondary | ICD-10-CM | POA: Diagnosis not present

## 2015-01-19 DIAGNOSIS — I10 Essential (primary) hypertension: Secondary | ICD-10-CM | POA: Diagnosis not present

## 2015-01-19 DIAGNOSIS — B370 Candidal stomatitis: Secondary | ICD-10-CM | POA: Diagnosis not present

## 2015-01-19 DIAGNOSIS — R10814 Left lower quadrant abdominal tenderness: Secondary | ICD-10-CM | POA: Diagnosis not present

## 2015-01-19 DIAGNOSIS — E119 Type 2 diabetes mellitus without complications: Secondary | ICD-10-CM | POA: Diagnosis not present

## 2015-01-19 DIAGNOSIS — J018 Other acute sinusitis: Secondary | ICD-10-CM | POA: Diagnosis not present

## 2015-01-19 DIAGNOSIS — J4541 Moderate persistent asthma with (acute) exacerbation: Secondary | ICD-10-CM | POA: Diagnosis not present

## 2015-01-19 DIAGNOSIS — E782 Mixed hyperlipidemia: Secondary | ICD-10-CM | POA: Diagnosis not present

## 2015-01-19 DIAGNOSIS — R1032 Left lower quadrant pain: Secondary | ICD-10-CM | POA: Diagnosis not present

## 2015-02-16 DIAGNOSIS — I10 Essential (primary) hypertension: Secondary | ICD-10-CM | POA: Diagnosis not present

## 2015-02-16 DIAGNOSIS — M1990 Unspecified osteoarthritis, unspecified site: Secondary | ICD-10-CM | POA: Diagnosis not present

## 2015-02-16 DIAGNOSIS — I8390 Asymptomatic varicose veins of unspecified lower extremity: Secondary | ICD-10-CM | POA: Diagnosis not present

## 2015-02-16 DIAGNOSIS — I872 Venous insufficiency (chronic) (peripheral): Secondary | ICD-10-CM | POA: Diagnosis not present

## 2015-02-22 DIAGNOSIS — J208 Acute bronchitis due to other specified organisms: Secondary | ICD-10-CM | POA: Diagnosis not present

## 2015-02-22 DIAGNOSIS — J209 Acute bronchitis, unspecified: Secondary | ICD-10-CM | POA: Diagnosis not present

## 2015-02-27 DIAGNOSIS — J208 Acute bronchitis due to other specified organisms: Secondary | ICD-10-CM | POA: Diagnosis not present

## 2015-02-27 DIAGNOSIS — R05 Cough: Secondary | ICD-10-CM | POA: Diagnosis not present

## 2015-03-02 DIAGNOSIS — R05 Cough: Secondary | ICD-10-CM | POA: Diagnosis not present

## 2015-03-02 DIAGNOSIS — J209 Acute bronchitis, unspecified: Secondary | ICD-10-CM | POA: Diagnosis not present

## 2015-03-02 DIAGNOSIS — R0602 Shortness of breath: Secondary | ICD-10-CM | POA: Diagnosis not present

## 2015-03-14 DIAGNOSIS — J208 Acute bronchitis due to other specified organisms: Secondary | ICD-10-CM | POA: Diagnosis not present

## 2015-03-14 DIAGNOSIS — R911 Solitary pulmonary nodule: Secondary | ICD-10-CM | POA: Diagnosis not present

## 2015-03-14 DIAGNOSIS — R0602 Shortness of breath: Secondary | ICD-10-CM | POA: Diagnosis not present

## 2015-03-14 DIAGNOSIS — E041 Nontoxic single thyroid nodule: Secondary | ICD-10-CM | POA: Diagnosis not present

## 2015-03-14 DIAGNOSIS — J9801 Acute bronchospasm: Secondary | ICD-10-CM | POA: Diagnosis not present

## 2015-03-15 DIAGNOSIS — J4521 Mild intermittent asthma with (acute) exacerbation: Secondary | ICD-10-CM | POA: Diagnosis not present

## 2015-03-15 DIAGNOSIS — J4540 Moderate persistent asthma, uncomplicated: Secondary | ICD-10-CM | POA: Insufficient documentation

## 2015-03-15 DIAGNOSIS — E669 Obesity, unspecified: Secondary | ICD-10-CM | POA: Diagnosis not present

## 2015-03-15 DIAGNOSIS — R911 Solitary pulmonary nodule: Secondary | ICD-10-CM | POA: Diagnosis not present

## 2015-03-15 DIAGNOSIS — K219 Gastro-esophageal reflux disease without esophagitis: Secondary | ICD-10-CM | POA: Insufficient documentation

## 2015-03-15 DIAGNOSIS — J45909 Unspecified asthma, uncomplicated: Secondary | ICD-10-CM | POA: Insufficient documentation

## 2015-03-20 DIAGNOSIS — E041 Nontoxic single thyroid nodule: Secondary | ICD-10-CM | POA: Diagnosis not present

## 2015-03-20 DIAGNOSIS — E042 Nontoxic multinodular goiter: Secondary | ICD-10-CM | POA: Diagnosis not present

## 2015-03-27 DIAGNOSIS — J301 Allergic rhinitis due to pollen: Secondary | ICD-10-CM | POA: Diagnosis not present

## 2015-03-27 DIAGNOSIS — B370 Candidal stomatitis: Secondary | ICD-10-CM | POA: Diagnosis not present

## 2015-04-04 DIAGNOSIS — E042 Nontoxic multinodular goiter: Secondary | ICD-10-CM | POA: Diagnosis not present

## 2015-04-04 DIAGNOSIS — E041 Nontoxic single thyroid nodule: Secondary | ICD-10-CM | POA: Diagnosis not present

## 2015-04-27 DIAGNOSIS — E782 Mixed hyperlipidemia: Secondary | ICD-10-CM | POA: Diagnosis not present

## 2015-04-27 DIAGNOSIS — E119 Type 2 diabetes mellitus without complications: Secondary | ICD-10-CM | POA: Diagnosis not present

## 2015-04-27 DIAGNOSIS — I10 Essential (primary) hypertension: Secondary | ICD-10-CM | POA: Diagnosis not present

## 2015-04-27 DIAGNOSIS — F411 Generalized anxiety disorder: Secondary | ICD-10-CM | POA: Diagnosis not present

## 2015-05-03 ENCOUNTER — Ambulatory Visit (INDEPENDENT_AMBULATORY_CARE_PROVIDER_SITE_OTHER): Payer: PPO

## 2015-05-03 ENCOUNTER — Ambulatory Visit (INDEPENDENT_AMBULATORY_CARE_PROVIDER_SITE_OTHER): Payer: PPO | Admitting: Sports Medicine

## 2015-05-03 DIAGNOSIS — M79672 Pain in left foot: Secondary | ICD-10-CM | POA: Diagnosis not present

## 2015-05-03 DIAGNOSIS — M779 Enthesopathy, unspecified: Secondary | ICD-10-CM

## 2015-05-03 DIAGNOSIS — M2142 Flat foot [pes planus] (acquired), left foot: Secondary | ICD-10-CM

## 2015-05-03 DIAGNOSIS — M2141 Flat foot [pes planus] (acquired), right foot: Secondary | ICD-10-CM

## 2015-05-03 DIAGNOSIS — M5416 Radiculopathy, lumbar region: Secondary | ICD-10-CM | POA: Diagnosis not present

## 2015-05-03 DIAGNOSIS — M79671 Pain in right foot: Secondary | ICD-10-CM | POA: Diagnosis not present

## 2015-05-03 DIAGNOSIS — R252 Cramp and spasm: Secondary | ICD-10-CM | POA: Diagnosis not present

## 2015-05-03 MED ORDER — NAPROXEN 500 MG PO TABS: 500.0000 mg | ORAL_TABLET | Freq: Two times a day (BID) | ORAL | Status: DC

## 2015-05-03 MED ORDER — TRIAMCINOLONE ACETONIDE 10 MG/ML IJ SUSP: 10.0000 mg | Freq: Once | INTRAMUSCULAR | Status: DC

## 2015-05-03 NOTE — Progress Notes (Signed)
Patient ID: Andrea White, female   DOB: 09-11-1955, 60 y.o.   MRN: [id number] Subjective: Andrea White is a 60 y.o. female patient who presents to office for evaluation of left greater than right foot pain. Patient complains of progressive pain since last week. That seems to have gotten better at the lateral aspect of the left foot. States that she was mowing her lawn and after mowing her lawn. She changed shoes and had excruciating pain noticed that the area was slightly swollen also had some redness to the site. States that she repeatedly ice the area with improvement in symptoms. Admits that also she has pain in the same area on her right foot. Denies any trauma or other causative factors. Patient also reports that she is having cramping sensations in her feet and legs that is worse at night, relieved by stretching the toes. Reports that she also has tried Biofreeze. There was some improvement. States that she has had her vascular status checked before and everything was within normal limits. Reports that she has a known history of lower back problems. At L5-S1 level with history of sciatica and still complains of left-sided symptoms, especially when sitting in a recliner. Patient denies any other pedal complaints.   Patient Active Problem List   Diagnosis Date Noted  . Herniated disc 06/10/2011  . History of heart attack 06/10/2011  . Ovarian cyst   . GERD (gastroesophageal reflux disease)     Current Outpatient Prescriptions on File Prior to Visit  Medication Sig Dispense Refill  . acyclovir (ZOVIRAX) 400 MG tablet Take 400 mg by mouth 5 (five) times daily.    Marland Kitchen ALPRAZolam (XANAX) 0.5 MG tablet Take 0.5 mg by mouth 3 (three) times daily as needed. For anxiety    . CARTIA XT 180 MG 24 hr capsule     . cyclobenzaprine (FLEXERIL) 10 MG tablet Take 10 mg by mouth 3 (three) times daily as needed. For muscle spasms    . diazepam (VALIUM) 5 MG tablet Take 1 tablet (5 mg total) by mouth every 8  (eight) hours as needed for anxiety. 12 tablet 0  . diltiazem (DILACOR XR) 180 MG 24 hr capsule Take 180 mg by mouth 2 (two) times daily.    . DULoxetine (CYMBALTA) 30 MG capsule     . methocarbamol (ROBAXIN) 500 MG tablet Take 50 mg by mouth 4 (four) times daily.    Marland Kitchen nystatin-triamcinolone (MYCOLOG II) cream Apply 1 application topically 2 (two) times daily as needed. For itching    . omeprazole (PRILOSEC) 40 MG capsule Take 40 mg by mouth 2 (two) times daily.    Marland Kitchen oxybutynin (DITROPAN) 5 MG tablet Take 5 mg by mouth 2 (two) times daily.     Marland Kitchen oxyCODONE-acetaminophen (PERCOCET/ROXICET) 5-325 MG per tablet Take 1 tablet by mouth every 4 (four) hours as needed for pain. 15 tablet 0  . polyethylene glycol (MIRALAX / GLYCOLAX) packet Take 17 g by mouth daily.    Marland Kitchen POTASSIUM GLUCONATE PO Take 2 tablets by mouth daily.     . promethazine (PHENERGAN) 12.5 MG tablet Take 12.5 mg by mouth every 6 (six) hours as needed for nausea or vomiting. NOT SURE OF THE MG/LC    . spironolactone-hydrochlorothiazide (ALDACTAZIDE) 25-25 MG per tablet Take 1 tablet by mouth daily.    . traMADol (ULTRAM) 50 MG tablet Take by mouth every 6 (six) hours as needed.     No current facility-administered medications on file prior to visit.  Allergies  Allergen Reactions  . Celebrex [Celecoxib]   . Morphine And Related   . Sulfa Antibiotics   . Valium [Diazepam]     Objective:  General: Alert and oriented x3 in no acute distress  Dermatology: No open lesions bilateral lower extremities, no webspace macerations, no ecchymosis bilateral, all nails x 10 are well manicured . However, short and thickened.  Vascular: Dorsalis Pedis and Posterior Tibial pedal pulses palpable, Capillary Fill Time 3 seconds, scant pedal hair growth bilateral, no edema bilateral lower extremities, + varicosities bilateral, Temperature gradient within normal limits.  Neurology: Gross sensation intact via light touch bilateral. (- )Tinels  sign bilateral. Subjective sharp shooting, burning pain from left, but talks down lower leg. Subjective toe cramping, bilateral.  Musculoskeletal: Mild tenderness with palpation at peroneal tendon course left greater than right foot,No pain with calf compression bilateral. There is decreased ankle rom with knee extending  vs flexed resembling gastroc equnius bilateral, Subtalar joint range of motion is within normal limits, there is no 1st ray hypermobility noted bilateral, decreased 1st MPJ rom Right>Left with functional limitus and hammertoe noted on weightbearing exam.Pes planus foot type, right greater than left with use of AFO. Strength within normal limits in all groups bilateral.   Xrays  Left Foot   Impression: Normal osseous mineralization. There is prior evidence or changes at the fifth metatarsal head consistent with possible previous exostectomy. There is mild hammertoe deformity. There is inferior heel spur with midtarsal breach and small osteophytes at the dorsal metatarsal joint, suggestive of osteoarthritis. There is significant pes planus deformity. No fracture or dislocation. Soft tissues within normal limits. No other acute findings  Assessment and Plan: Problem List Items Addressed This Visit    None    Visit Diagnoses    Tendonitis    -  Primary    Relevant Medications    triamcinolone acetonide (KENALOG) 10 MG/ML injection 10 mg (Start on 05/03/2015  6:15 PM)    Other Relevant Orders    DG Foot Complete Left    Foot pain, bilateral        Relevant Medications    triamcinolone acetonide (KENALOG) 10 MG/ML injection 10 mg (Start on 05/03/2015  6:15 PM)    Nocturnal foot cramps        Radiculopathy, lumbar region        Pes planus of both feet           -Complete examination performed -Xrays reviewed -Discussed treatement options -After oral consent and aseptic prep, injected a mixture containing 1 ml of 2%  plain lidocaine, 1 ml 0.5% plain marcaine, 0.5 ml of kenalog  10 and 0.5 ml of dexamethasone phosphate into peroneal tendon sheath right and left foot without complication. Post-injection care discussed with patient.  -Recommended icing daily until symptoms improve.  -Recommend good supportive shoes for foot type -Prescription given for [facility] lab to go for adjustments of AFO on right -Prescribed naproxen 500 mg twice a day to take as needed for pain and inflammation Recommend close monitoring of nocturnal foot cramps. Advised patient to try tonic water. If fails to improve will consider oral medications.  -Patient to return to office 4 weeks or sooner if condition worsens.  Landis Martins, DPM

## 2015-05-10 DIAGNOSIS — I10 Essential (primary) hypertension: Secondary | ICD-10-CM | POA: Diagnosis not present

## 2015-05-10 DIAGNOSIS — E119 Type 2 diabetes mellitus without complications: Secondary | ICD-10-CM | POA: Diagnosis not present

## 2015-05-10 DIAGNOSIS — H5203 Hypermetropia, bilateral: Secondary | ICD-10-CM | POA: Diagnosis not present

## 2015-05-10 DIAGNOSIS — H52221 Regular astigmatism, right eye: Secondary | ICD-10-CM | POA: Diagnosis not present

## 2015-05-10 DIAGNOSIS — Z7984 Long term (current) use of oral hypoglycemic drugs: Secondary | ICD-10-CM | POA: Diagnosis not present

## 2015-05-10 DIAGNOSIS — H524 Presbyopia: Secondary | ICD-10-CM | POA: Diagnosis not present

## 2015-05-10 DIAGNOSIS — H25813 Combined forms of age-related cataract, bilateral: Secondary | ICD-10-CM | POA: Diagnosis not present

## 2015-05-29 DIAGNOSIS — M79605 Pain in left leg: Secondary | ICD-10-CM | POA: Diagnosis not present

## 2015-05-29 DIAGNOSIS — L490 Exfoliation due to erythematous condition involving less than 10 percent of body surface: Secondary | ICD-10-CM | POA: Diagnosis not present

## 2015-05-30 DIAGNOSIS — M79605 Pain in left leg: Secondary | ICD-10-CM | POA: Diagnosis not present

## 2015-05-31 ENCOUNTER — Ambulatory Visit: Payer: PPO | Admitting: Sports Medicine

## 2015-06-12 DIAGNOSIS — J208 Acute bronchitis due to other specified organisms: Secondary | ICD-10-CM | POA: Diagnosis not present

## 2015-06-12 DIAGNOSIS — J4541 Moderate persistent asthma with (acute) exacerbation: Secondary | ICD-10-CM | POA: Diagnosis not present

## 2015-07-07 DIAGNOSIS — Z1231 Encounter for screening mammogram for malignant neoplasm of breast: Secondary | ICD-10-CM | POA: Diagnosis not present

## 2015-07-21 DIAGNOSIS — J208 Acute bronchitis due to other specified organisms: Secondary | ICD-10-CM | POA: Diagnosis not present

## 2015-07-21 DIAGNOSIS — H1013 Acute atopic conjunctivitis, bilateral: Secondary | ICD-10-CM | POA: Diagnosis not present

## 2015-07-21 DIAGNOSIS — K5901 Slow transit constipation: Secondary | ICD-10-CM | POA: Diagnosis not present

## 2015-07-31 DIAGNOSIS — F411 Generalized anxiety disorder: Secondary | ICD-10-CM | POA: Diagnosis not present

## 2015-07-31 DIAGNOSIS — E119 Type 2 diabetes mellitus without complications: Secondary | ICD-10-CM | POA: Diagnosis not present

## 2015-07-31 DIAGNOSIS — E1165 Type 2 diabetes mellitus with hyperglycemia: Secondary | ICD-10-CM | POA: Diagnosis not present

## 2015-07-31 DIAGNOSIS — E782 Mixed hyperlipidemia: Secondary | ICD-10-CM | POA: Diagnosis not present

## 2015-07-31 DIAGNOSIS — I10 Essential (primary) hypertension: Secondary | ICD-10-CM | POA: Diagnosis not present

## 2015-08-26 DIAGNOSIS — W458XXA Other foreign body or object entering through skin, initial encounter: Secondary | ICD-10-CM | POA: Diagnosis not present

## 2015-08-26 DIAGNOSIS — S81811A Laceration without foreign body, right lower leg, initial encounter: Secondary | ICD-10-CM | POA: Diagnosis not present

## 2015-09-06 DIAGNOSIS — Z90710 Acquired absence of both cervix and uterus: Secondary | ICD-10-CM | POA: Diagnosis not present

## 2015-09-06 DIAGNOSIS — K219 Gastro-esophageal reflux disease without esophagitis: Secondary | ICD-10-CM | POA: Diagnosis not present

## 2015-09-06 DIAGNOSIS — Z885 Allergy status to narcotic agent status: Secondary | ICD-10-CM | POA: Diagnosis not present

## 2015-09-06 DIAGNOSIS — M797 Fibromyalgia: Secondary | ICD-10-CM | POA: Diagnosis not present

## 2015-09-06 DIAGNOSIS — Z9049 Acquired absence of other specified parts of digestive tract: Secondary | ICD-10-CM | POA: Diagnosis not present

## 2015-09-06 DIAGNOSIS — E119 Type 2 diabetes mellitus without complications: Secondary | ICD-10-CM | POA: Diagnosis not present

## 2015-09-06 DIAGNOSIS — Z4802 Encounter for removal of sutures: Secondary | ICD-10-CM | POA: Diagnosis not present

## 2015-09-06 DIAGNOSIS — L03115 Cellulitis of right lower limb: Secondary | ICD-10-CM | POA: Diagnosis not present

## 2015-09-06 DIAGNOSIS — M5136 Other intervertebral disc degeneration, lumbar region: Secondary | ICD-10-CM | POA: Diagnosis not present

## 2015-09-06 DIAGNOSIS — I10 Essential (primary) hypertension: Secondary | ICD-10-CM | POA: Diagnosis not present

## 2015-09-06 DIAGNOSIS — K589 Irritable bowel syndrome without diarrhea: Secondary | ICD-10-CM | POA: Diagnosis not present

## 2015-09-09 DIAGNOSIS — E119 Type 2 diabetes mellitus without complications: Secondary | ICD-10-CM | POA: Diagnosis not present

## 2015-09-09 DIAGNOSIS — M5136 Other intervertebral disc degeneration, lumbar region: Secondary | ICD-10-CM | POA: Diagnosis not present

## 2015-09-09 DIAGNOSIS — I10 Essential (primary) hypertension: Secondary | ICD-10-CM | POA: Diagnosis not present

## 2015-09-09 DIAGNOSIS — Z4801 Encounter for change or removal of surgical wound dressing: Secondary | ICD-10-CM | POA: Diagnosis not present

## 2015-09-09 DIAGNOSIS — Z7984 Long term (current) use of oral hypoglycemic drugs: Secondary | ICD-10-CM | POA: Diagnosis not present

## 2015-09-09 DIAGNOSIS — K219 Gastro-esophageal reflux disease without esophagitis: Secondary | ICD-10-CM | POA: Diagnosis not present

## 2015-09-09 DIAGNOSIS — K589 Irritable bowel syndrome without diarrhea: Secondary | ICD-10-CM | POA: Diagnosis not present

## 2015-09-09 DIAGNOSIS — Z5189 Encounter for other specified aftercare: Secondary | ICD-10-CM | POA: Diagnosis not present

## 2015-09-09 DIAGNOSIS — Z885 Allergy status to narcotic agent status: Secondary | ICD-10-CM | POA: Diagnosis not present

## 2015-09-09 DIAGNOSIS — Z9049 Acquired absence of other specified parts of digestive tract: Secondary | ICD-10-CM | POA: Diagnosis not present

## 2015-09-09 DIAGNOSIS — Z90710 Acquired absence of both cervix and uterus: Secondary | ICD-10-CM | POA: Diagnosis not present

## 2015-09-09 DIAGNOSIS — M797 Fibromyalgia: Secondary | ICD-10-CM | POA: Diagnosis not present

## 2015-09-14 DIAGNOSIS — S81811A Laceration without foreign body, right lower leg, initial encounter: Secondary | ICD-10-CM | POA: Diagnosis not present

## 2015-09-29 DIAGNOSIS — R1111 Vomiting without nausea: Secondary | ICD-10-CM | POA: Diagnosis not present

## 2015-09-29 DIAGNOSIS — K591 Functional diarrhea: Secondary | ICD-10-CM | POA: Diagnosis not present

## 2015-09-29 DIAGNOSIS — F411 Generalized anxiety disorder: Secondary | ICD-10-CM | POA: Diagnosis not present

## 2015-09-29 DIAGNOSIS — R198 Other specified symptoms and signs involving the digestive system and abdomen: Secondary | ICD-10-CM | POA: Diagnosis not present

## 2015-10-03 DIAGNOSIS — I872 Venous insufficiency (chronic) (peripheral): Secondary | ICD-10-CM | POA: Diagnosis not present

## 2015-10-03 DIAGNOSIS — L97812 Non-pressure chronic ulcer of other part of right lower leg with fat layer exposed: Secondary | ICD-10-CM | POA: Diagnosis not present

## 2015-10-03 DIAGNOSIS — Z7984 Long term (current) use of oral hypoglycemic drugs: Secondary | ICD-10-CM | POA: Diagnosis not present

## 2015-10-03 DIAGNOSIS — I10 Essential (primary) hypertension: Secondary | ICD-10-CM | POA: Diagnosis not present

## 2015-10-03 DIAGNOSIS — J45909 Unspecified asthma, uncomplicated: Secondary | ICD-10-CM | POA: Diagnosis not present

## 2015-10-03 DIAGNOSIS — Y658 Other specified misadventures during surgical and medical care: Secondary | ICD-10-CM | POA: Diagnosis not present

## 2015-10-03 DIAGNOSIS — T8133XA Disruption of traumatic injury wound repair, initial encounter: Secondary | ICD-10-CM | POA: Diagnosis not present

## 2015-10-03 DIAGNOSIS — E119 Type 2 diabetes mellitus without complications: Secondary | ICD-10-CM | POA: Diagnosis not present

## 2015-10-03 DIAGNOSIS — F40240 Claustrophobia: Secondary | ICD-10-CM | POA: Diagnosis not present

## 2015-10-03 DIAGNOSIS — T8131XA Disruption of external operation (surgical) wound, not elsewhere classified, initial encounter: Secondary | ICD-10-CM | POA: Diagnosis not present

## 2015-10-03 DIAGNOSIS — M797 Fibromyalgia: Secondary | ICD-10-CM | POA: Diagnosis not present

## 2015-10-03 DIAGNOSIS — L97919 Non-pressure chronic ulcer of unspecified part of right lower leg with unspecified severity: Secondary | ICD-10-CM | POA: Diagnosis not present

## 2015-10-03 DIAGNOSIS — M1990 Unspecified osteoarthritis, unspecified site: Secondary | ICD-10-CM | POA: Diagnosis not present

## 2015-10-09 DIAGNOSIS — I872 Venous insufficiency (chronic) (peripheral): Secondary | ICD-10-CM | POA: Diagnosis not present

## 2015-10-09 DIAGNOSIS — Y838 Other surgical procedures as the cause of abnormal reaction of the patient, or of later complication, without mention of misadventure at the time of the procedure: Secondary | ICD-10-CM | POA: Diagnosis not present

## 2015-10-09 DIAGNOSIS — T8133XD Disruption of traumatic injury wound repair, subsequent encounter: Secondary | ICD-10-CM | POA: Diagnosis not present

## 2015-10-09 DIAGNOSIS — L97211 Non-pressure chronic ulcer of right calf limited to breakdown of skin: Secondary | ICD-10-CM | POA: Diagnosis not present

## 2015-10-10 DIAGNOSIS — L97919 Non-pressure chronic ulcer of unspecified part of right lower leg with unspecified severity: Secondary | ICD-10-CM | POA: Diagnosis not present

## 2015-10-10 DIAGNOSIS — I872 Venous insufficiency (chronic) (peripheral): Secondary | ICD-10-CM | POA: Diagnosis not present

## 2015-10-16 DIAGNOSIS — I872 Venous insufficiency (chronic) (peripheral): Secondary | ICD-10-CM | POA: Diagnosis not present

## 2015-10-16 DIAGNOSIS — T8133XD Disruption of traumatic injury wound repair, subsequent encounter: Secondary | ICD-10-CM | POA: Diagnosis not present

## 2015-10-16 DIAGNOSIS — L97812 Non-pressure chronic ulcer of other part of right lower leg with fat layer exposed: Secondary | ICD-10-CM | POA: Diagnosis not present

## 2015-10-16 DIAGNOSIS — Y838 Other surgical procedures as the cause of abnormal reaction of the patient, or of later complication, without mention of misadventure at the time of the procedure: Secondary | ICD-10-CM | POA: Diagnosis not present

## 2015-10-16 DIAGNOSIS — I87311 Chronic venous hypertension (idiopathic) with ulcer of right lower extremity: Secondary | ICD-10-CM | POA: Diagnosis not present

## 2015-10-16 DIAGNOSIS — E119 Type 2 diabetes mellitus without complications: Secondary | ICD-10-CM | POA: Diagnosis not present

## 2015-10-20 DIAGNOSIS — Z0000 Encounter for general adult medical examination without abnormal findings: Secondary | ICD-10-CM | POA: Diagnosis not present

## 2015-10-20 DIAGNOSIS — Z6837 Body mass index (BMI) 37.0-37.9, adult: Secondary | ICD-10-CM | POA: Diagnosis not present

## 2015-10-23 DIAGNOSIS — T8133XD Disruption of traumatic injury wound repair, subsequent encounter: Secondary | ICD-10-CM | POA: Diagnosis not present

## 2015-10-23 DIAGNOSIS — E119 Type 2 diabetes mellitus without complications: Secondary | ICD-10-CM | POA: Diagnosis not present

## 2015-10-23 DIAGNOSIS — T8131XA Disruption of external operation (surgical) wound, not elsewhere classified, initial encounter: Secondary | ICD-10-CM | POA: Diagnosis not present

## 2015-10-23 DIAGNOSIS — I872 Venous insufficiency (chronic) (peripheral): Secondary | ICD-10-CM | POA: Diagnosis not present

## 2015-10-23 DIAGNOSIS — I87311 Chronic venous hypertension (idiopathic) with ulcer of right lower extremity: Secondary | ICD-10-CM | POA: Diagnosis not present

## 2015-10-23 DIAGNOSIS — Y838 Other surgical procedures as the cause of abnormal reaction of the patient, or of later complication, without mention of misadventure at the time of the procedure: Secondary | ICD-10-CM | POA: Diagnosis not present

## 2015-10-23 DIAGNOSIS — L97212 Non-pressure chronic ulcer of right calf with fat layer exposed: Secondary | ICD-10-CM | POA: Diagnosis not present

## 2015-10-31 DIAGNOSIS — T8133XD Disruption of traumatic injury wound repair, subsequent encounter: Secondary | ICD-10-CM | POA: Diagnosis not present

## 2015-10-31 DIAGNOSIS — E119 Type 2 diabetes mellitus without complications: Secondary | ICD-10-CM | POA: Diagnosis not present

## 2015-10-31 DIAGNOSIS — L97812 Non-pressure chronic ulcer of other part of right lower leg with fat layer exposed: Secondary | ICD-10-CM | POA: Diagnosis not present

## 2015-10-31 DIAGNOSIS — I872 Venous insufficiency (chronic) (peripheral): Secondary | ICD-10-CM | POA: Diagnosis not present

## 2015-10-31 DIAGNOSIS — I87311 Chronic venous hypertension (idiopathic) with ulcer of right lower extremity: Secondary | ICD-10-CM | POA: Diagnosis not present

## 2015-10-31 DIAGNOSIS — Y838 Other surgical procedures as the cause of abnormal reaction of the patient, or of later complication, without mention of misadventure at the time of the procedure: Secondary | ICD-10-CM | POA: Diagnosis not present

## 2015-11-03 DIAGNOSIS — L97919 Non-pressure chronic ulcer of unspecified part of right lower leg with unspecified severity: Secondary | ICD-10-CM | POA: Diagnosis not present

## 2015-11-06 DIAGNOSIS — I10 Essential (primary) hypertension: Secondary | ICD-10-CM | POA: Diagnosis not present

## 2015-11-06 DIAGNOSIS — Y838 Other surgical procedures as the cause of abnormal reaction of the patient, or of later complication, without mention of misadventure at the time of the procedure: Secondary | ICD-10-CM | POA: Diagnosis not present

## 2015-11-06 DIAGNOSIS — M79641 Pain in right hand: Secondary | ICD-10-CM | POA: Diagnosis not present

## 2015-11-06 DIAGNOSIS — Z23 Encounter for immunization: Secondary | ICD-10-CM | POA: Diagnosis not present

## 2015-11-06 DIAGNOSIS — E119 Type 2 diabetes mellitus without complications: Secondary | ICD-10-CM | POA: Diagnosis not present

## 2015-11-06 DIAGNOSIS — I872 Venous insufficiency (chronic) (peripheral): Secondary | ICD-10-CM | POA: Diagnosis not present

## 2015-11-06 DIAGNOSIS — T8133XD Disruption of traumatic injury wound repair, subsequent encounter: Secondary | ICD-10-CM | POA: Diagnosis not present

## 2015-11-06 DIAGNOSIS — F411 Generalized anxiety disorder: Secondary | ICD-10-CM | POA: Diagnosis not present

## 2015-11-06 DIAGNOSIS — R10816 Epigastric abdominal tenderness: Secondary | ICD-10-CM | POA: Diagnosis not present

## 2015-11-06 DIAGNOSIS — E782 Mixed hyperlipidemia: Secondary | ICD-10-CM | POA: Diagnosis not present

## 2015-11-06 DIAGNOSIS — T8133XA Disruption of traumatic injury wound repair, initial encounter: Secondary | ICD-10-CM | POA: Diagnosis not present

## 2015-11-06 DIAGNOSIS — E1121 Type 2 diabetes mellitus with diabetic nephropathy: Secondary | ICD-10-CM | POA: Diagnosis not present

## 2015-11-13 DIAGNOSIS — Y838 Other surgical procedures as the cause of abnormal reaction of the patient, or of later complication, without mention of misadventure at the time of the procedure: Secondary | ICD-10-CM | POA: Diagnosis not present

## 2015-11-13 DIAGNOSIS — I87311 Chronic venous hypertension (idiopathic) with ulcer of right lower extremity: Secondary | ICD-10-CM | POA: Diagnosis not present

## 2015-11-13 DIAGNOSIS — T8133XA Disruption of traumatic injury wound repair, initial encounter: Secondary | ICD-10-CM | POA: Diagnosis not present

## 2015-11-13 DIAGNOSIS — E1121 Type 2 diabetes mellitus with diabetic nephropathy: Secondary | ICD-10-CM | POA: Diagnosis not present

## 2015-11-13 DIAGNOSIS — E119 Type 2 diabetes mellitus without complications: Secondary | ICD-10-CM | POA: Diagnosis not present

## 2015-11-13 DIAGNOSIS — L97811 Non-pressure chronic ulcer of other part of right lower leg limited to breakdown of skin: Secondary | ICD-10-CM | POA: Diagnosis not present

## 2015-11-13 DIAGNOSIS — I872 Venous insufficiency (chronic) (peripheral): Secondary | ICD-10-CM | POA: Diagnosis not present

## 2015-11-14 DIAGNOSIS — R1310 Dysphagia, unspecified: Secondary | ICD-10-CM | POA: Diagnosis not present

## 2015-11-14 DIAGNOSIS — R1013 Epigastric pain: Secondary | ICD-10-CM | POA: Diagnosis not present

## 2015-11-16 DIAGNOSIS — N898 Other specified noninflammatory disorders of vagina: Secondary | ICD-10-CM | POA: Diagnosis not present

## 2015-11-20 DIAGNOSIS — M19031 Primary osteoarthritis, right wrist: Secondary | ICD-10-CM | POA: Diagnosis not present

## 2015-11-20 DIAGNOSIS — G5601 Carpal tunnel syndrome, right upper limb: Secondary | ICD-10-CM | POA: Diagnosis not present

## 2015-11-20 DIAGNOSIS — E119 Type 2 diabetes mellitus without complications: Secondary | ICD-10-CM | POA: Diagnosis not present

## 2015-11-20 DIAGNOSIS — Z09 Encounter for follow-up examination after completed treatment for conditions other than malignant neoplasm: Secondary | ICD-10-CM | POA: Diagnosis not present

## 2015-11-20 DIAGNOSIS — I872 Venous insufficiency (chronic) (peripheral): Secondary | ICD-10-CM | POA: Diagnosis not present

## 2015-11-20 DIAGNOSIS — Z872 Personal history of diseases of the skin and subcutaneous tissue: Secondary | ICD-10-CM | POA: Diagnosis not present

## 2015-11-29 DIAGNOSIS — K2960 Other gastritis without bleeding: Secondary | ICD-10-CM | POA: Diagnosis not present

## 2015-11-29 DIAGNOSIS — K222 Esophageal obstruction: Secondary | ICD-10-CM | POA: Diagnosis not present

## 2015-11-29 DIAGNOSIS — K319 Disease of stomach and duodenum, unspecified: Secondary | ICD-10-CM | POA: Diagnosis not present

## 2015-11-29 DIAGNOSIS — R1013 Epigastric pain: Secondary | ICD-10-CM | POA: Diagnosis not present

## 2015-11-29 DIAGNOSIS — R1310 Dysphagia, unspecified: Secondary | ICD-10-CM | POA: Diagnosis not present

## 2015-12-06 DIAGNOSIS — X58XXXA Exposure to other specified factors, initial encounter: Secondary | ICD-10-CM | POA: Diagnosis not present

## 2015-12-06 DIAGNOSIS — S53402A Unspecified sprain of left elbow, initial encounter: Secondary | ICD-10-CM | POA: Diagnosis not present

## 2015-12-06 DIAGNOSIS — M25522 Pain in left elbow: Secondary | ICD-10-CM | POA: Diagnosis not present

## 2015-12-06 DIAGNOSIS — S42402A Unspecified fracture of lower end of left humerus, initial encounter for closed fracture: Secondary | ICD-10-CM | POA: Diagnosis not present

## 2015-12-06 DIAGNOSIS — Y9389 Activity, other specified: Secondary | ICD-10-CM | POA: Diagnosis not present

## 2015-12-06 DIAGNOSIS — M25552 Pain in left hip: Secondary | ICD-10-CM | POA: Diagnosis not present

## 2015-12-07 DIAGNOSIS — G5611 Other lesions of median nerve, right upper limb: Secondary | ICD-10-CM | POA: Diagnosis not present

## 2016-02-13 DIAGNOSIS — E782 Mixed hyperlipidemia: Secondary | ICD-10-CM | POA: Diagnosis not present

## 2016-02-13 DIAGNOSIS — F411 Generalized anxiety disorder: Secondary | ICD-10-CM | POA: Diagnosis not present

## 2016-02-13 DIAGNOSIS — I10 Essential (primary) hypertension: Secondary | ICD-10-CM | POA: Diagnosis not present

## 2016-02-13 DIAGNOSIS — E119 Type 2 diabetes mellitus without complications: Secondary | ICD-10-CM | POA: Diagnosis not present

## 2016-02-23 DIAGNOSIS — M79662 Pain in left lower leg: Secondary | ICD-10-CM | POA: Diagnosis not present

## 2016-02-23 DIAGNOSIS — Z79899 Other long term (current) drug therapy: Secondary | ICD-10-CM | POA: Diagnosis not present

## 2016-02-23 DIAGNOSIS — I10 Essential (primary) hypertension: Secondary | ICD-10-CM | POA: Diagnosis not present

## 2016-02-23 DIAGNOSIS — M79604 Pain in right leg: Secondary | ICD-10-CM | POA: Diagnosis not present

## 2016-02-23 DIAGNOSIS — W19XXXA Unspecified fall, initial encounter: Secondary | ICD-10-CM | POA: Diagnosis not present

## 2016-02-23 DIAGNOSIS — S81812A Laceration without foreign body, left lower leg, initial encounter: Secondary | ICD-10-CM | POA: Diagnosis not present

## 2016-02-23 DIAGNOSIS — S81811A Laceration without foreign body, right lower leg, initial encounter: Secondary | ICD-10-CM | POA: Diagnosis not present

## 2016-02-23 DIAGNOSIS — M79605 Pain in left leg: Secondary | ICD-10-CM | POA: Diagnosis not present

## 2016-02-28 DIAGNOSIS — S81811A Laceration without foreign body, right lower leg, initial encounter: Secondary | ICD-10-CM | POA: Diagnosis not present

## 2016-02-28 DIAGNOSIS — S81812A Laceration without foreign body, left lower leg, initial encounter: Secondary | ICD-10-CM | POA: Diagnosis not present

## 2016-03-05 DIAGNOSIS — L97919 Non-pressure chronic ulcer of unspecified part of right lower leg with unspecified severity: Secondary | ICD-10-CM | POA: Diagnosis not present

## 2016-03-05 DIAGNOSIS — S81811A Laceration without foreign body, right lower leg, initial encounter: Secondary | ICD-10-CM | POA: Diagnosis not present

## 2016-03-05 DIAGNOSIS — S81802A Unspecified open wound, left lower leg, initial encounter: Secondary | ICD-10-CM | POA: Diagnosis not present

## 2016-03-05 DIAGNOSIS — I872 Venous insufficiency (chronic) (peripheral): Secondary | ICD-10-CM | POA: Diagnosis not present

## 2016-03-05 DIAGNOSIS — S81801A Unspecified open wound, right lower leg, initial encounter: Secondary | ICD-10-CM | POA: Diagnosis not present

## 2016-03-05 DIAGNOSIS — S81812A Laceration without foreign body, left lower leg, initial encounter: Secondary | ICD-10-CM | POA: Diagnosis not present

## 2016-03-05 DIAGNOSIS — T8131XA Disruption of external operation (surgical) wound, not elsewhere classified, initial encounter: Secondary | ICD-10-CM | POA: Diagnosis not present

## 2016-03-05 DIAGNOSIS — E119 Type 2 diabetes mellitus without complications: Secondary | ICD-10-CM | POA: Diagnosis not present

## 2016-03-12 DIAGNOSIS — X58XXXD Exposure to other specified factors, subsequent encounter: Secondary | ICD-10-CM | POA: Diagnosis not present

## 2016-03-12 DIAGNOSIS — I872 Venous insufficiency (chronic) (peripheral): Secondary | ICD-10-CM | POA: Diagnosis not present

## 2016-03-12 DIAGNOSIS — S81802D Unspecified open wound, left lower leg, subsequent encounter: Secondary | ICD-10-CM | POA: Diagnosis not present

## 2016-03-12 DIAGNOSIS — S81801D Unspecified open wound, right lower leg, subsequent encounter: Secondary | ICD-10-CM | POA: Diagnosis not present

## 2016-03-12 DIAGNOSIS — S81811A Laceration without foreign body, right lower leg, initial encounter: Secondary | ICD-10-CM | POA: Diagnosis not present

## 2016-03-12 DIAGNOSIS — E119 Type 2 diabetes mellitus without complications: Secondary | ICD-10-CM | POA: Diagnosis not present

## 2016-03-12 DIAGNOSIS — S81812A Laceration without foreign body, left lower leg, initial encounter: Secondary | ICD-10-CM | POA: Diagnosis not present

## 2016-03-12 DIAGNOSIS — T8131XD Disruption of external operation (surgical) wound, not elsewhere classified, subsequent encounter: Secondary | ICD-10-CM | POA: Diagnosis not present

## 2016-03-15 DIAGNOSIS — E119 Type 2 diabetes mellitus without complications: Secondary | ICD-10-CM | POA: Diagnosis not present

## 2016-03-15 DIAGNOSIS — N3000 Acute cystitis without hematuria: Secondary | ICD-10-CM | POA: Diagnosis not present

## 2016-03-15 DIAGNOSIS — X58XXXA Exposure to other specified factors, initial encounter: Secondary | ICD-10-CM | POA: Diagnosis not present

## 2016-03-15 DIAGNOSIS — I10 Essential (primary) hypertension: Secondary | ICD-10-CM | POA: Diagnosis not present

## 2016-03-15 DIAGNOSIS — S81801A Unspecified open wound, right lower leg, initial encounter: Secondary | ICD-10-CM | POA: Diagnosis not present

## 2016-03-15 DIAGNOSIS — S81802A Unspecified open wound, left lower leg, initial encounter: Secondary | ICD-10-CM | POA: Diagnosis not present

## 2016-03-19 DIAGNOSIS — S81811A Laceration without foreign body, right lower leg, initial encounter: Secondary | ICD-10-CM | POA: Diagnosis not present

## 2016-03-19 DIAGNOSIS — S81802A Unspecified open wound, left lower leg, initial encounter: Secondary | ICD-10-CM | POA: Diagnosis not present

## 2016-03-19 DIAGNOSIS — X58XXXA Exposure to other specified factors, initial encounter: Secondary | ICD-10-CM | POA: Diagnosis not present

## 2016-03-22 DIAGNOSIS — X58XXXA Exposure to other specified factors, initial encounter: Secondary | ICD-10-CM | POA: Diagnosis not present

## 2016-03-22 DIAGNOSIS — S81802A Unspecified open wound, left lower leg, initial encounter: Secondary | ICD-10-CM | POA: Diagnosis not present

## 2016-03-26 DIAGNOSIS — T8131XD Disruption of external operation (surgical) wound, not elsewhere classified, subsequent encounter: Secondary | ICD-10-CM | POA: Diagnosis not present

## 2016-03-26 DIAGNOSIS — E119 Type 2 diabetes mellitus without complications: Secondary | ICD-10-CM | POA: Diagnosis not present

## 2016-03-26 DIAGNOSIS — I872 Venous insufficiency (chronic) (peripheral): Secondary | ICD-10-CM | POA: Diagnosis not present

## 2016-03-26 DIAGNOSIS — X58XXXD Exposure to other specified factors, subsequent encounter: Secondary | ICD-10-CM | POA: Diagnosis not present

## 2016-03-26 DIAGNOSIS — S81812A Laceration without foreign body, left lower leg, initial encounter: Secondary | ICD-10-CM | POA: Diagnosis not present

## 2016-03-26 DIAGNOSIS — S81801D Unspecified open wound, right lower leg, subsequent encounter: Secondary | ICD-10-CM | POA: Diagnosis not present

## 2016-03-26 DIAGNOSIS — S81802D Unspecified open wound, left lower leg, subsequent encounter: Secondary | ICD-10-CM | POA: Diagnosis not present

## 2016-04-01 DIAGNOSIS — G5601 Carpal tunnel syndrome, right upper limb: Secondary | ICD-10-CM | POA: Diagnosis not present

## 2016-04-01 DIAGNOSIS — M19031 Primary osteoarthritis, right wrist: Secondary | ICD-10-CM | POA: Diagnosis not present

## 2016-04-02 DIAGNOSIS — X58XXXA Exposure to other specified factors, initial encounter: Secondary | ICD-10-CM | POA: Diagnosis not present

## 2016-04-02 DIAGNOSIS — S81811A Laceration without foreign body, right lower leg, initial encounter: Secondary | ICD-10-CM | POA: Diagnosis not present

## 2016-04-02 DIAGNOSIS — E11628 Type 2 diabetes mellitus with other skin complications: Secondary | ICD-10-CM | POA: Diagnosis not present

## 2016-04-02 DIAGNOSIS — S81801A Unspecified open wound, right lower leg, initial encounter: Secondary | ICD-10-CM | POA: Diagnosis not present

## 2016-04-09 DIAGNOSIS — Y838 Other surgical procedures as the cause of abnormal reaction of the patient, or of later complication, without mention of misadventure at the time of the procedure: Secondary | ICD-10-CM | POA: Diagnosis not present

## 2016-04-09 DIAGNOSIS — E11628 Type 2 diabetes mellitus with other skin complications: Secondary | ICD-10-CM | POA: Diagnosis not present

## 2016-04-09 DIAGNOSIS — S81811A Laceration without foreign body, right lower leg, initial encounter: Secondary | ICD-10-CM | POA: Diagnosis not present

## 2016-04-09 DIAGNOSIS — T8133XA Disruption of traumatic injury wound repair, initial encounter: Secondary | ICD-10-CM | POA: Diagnosis not present

## 2016-04-10 DIAGNOSIS — G894 Chronic pain syndrome: Secondary | ICD-10-CM | POA: Diagnosis not present

## 2016-04-10 DIAGNOSIS — M542 Cervicalgia: Secondary | ICD-10-CM | POA: Diagnosis not present

## 2016-04-10 DIAGNOSIS — Z79891 Long term (current) use of opiate analgesic: Secondary | ICD-10-CM | POA: Diagnosis not present

## 2016-04-10 DIAGNOSIS — Z79899 Other long term (current) drug therapy: Secondary | ICD-10-CM | POA: Diagnosis not present

## 2016-04-10 DIAGNOSIS — M791 Myalgia: Secondary | ICD-10-CM | POA: Diagnosis not present

## 2016-04-23 DIAGNOSIS — S81811A Laceration without foreign body, right lower leg, initial encounter: Secondary | ICD-10-CM | POA: Diagnosis not present

## 2016-04-23 DIAGNOSIS — T8133XA Disruption of traumatic injury wound repair, initial encounter: Secondary | ICD-10-CM | POA: Diagnosis not present

## 2016-04-23 DIAGNOSIS — E11628 Type 2 diabetes mellitus with other skin complications: Secondary | ICD-10-CM | POA: Diagnosis not present

## 2016-04-23 DIAGNOSIS — Y838 Other surgical procedures as the cause of abnormal reaction of the patient, or of later complication, without mention of misadventure at the time of the procedure: Secondary | ICD-10-CM | POA: Diagnosis not present

## 2016-05-01 DIAGNOSIS — M19031 Primary osteoarthritis, right wrist: Secondary | ICD-10-CM | POA: Diagnosis not present

## 2016-05-01 DIAGNOSIS — G5601 Carpal tunnel syndrome, right upper limb: Secondary | ICD-10-CM | POA: Diagnosis not present

## 2016-05-03 DIAGNOSIS — Z7984 Long term (current) use of oral hypoglycemic drugs: Secondary | ICD-10-CM | POA: Diagnosis not present

## 2016-05-03 DIAGNOSIS — F419 Anxiety disorder, unspecified: Secondary | ICD-10-CM | POA: Diagnosis not present

## 2016-05-03 DIAGNOSIS — G8918 Other acute postprocedural pain: Secondary | ICD-10-CM | POA: Diagnosis not present

## 2016-05-03 DIAGNOSIS — M1811 Unilateral primary osteoarthritis of first carpometacarpal joint, right hand: Secondary | ICD-10-CM | POA: Diagnosis not present

## 2016-05-03 DIAGNOSIS — M659 Synovitis and tenosynovitis, unspecified: Secondary | ICD-10-CM | POA: Diagnosis not present

## 2016-05-03 DIAGNOSIS — M19031 Primary osteoarthritis, right wrist: Secondary | ICD-10-CM | POA: Diagnosis not present

## 2016-05-03 DIAGNOSIS — Z79899 Other long term (current) drug therapy: Secondary | ICD-10-CM | POA: Diagnosis not present

## 2016-05-03 DIAGNOSIS — Z86711 Personal history of pulmonary embolism: Secondary | ICD-10-CM | POA: Diagnosis not present

## 2016-05-03 DIAGNOSIS — K219 Gastro-esophageal reflux disease without esophagitis: Secondary | ICD-10-CM | POA: Diagnosis not present

## 2016-05-03 DIAGNOSIS — G5601 Carpal tunnel syndrome, right upper limb: Secondary | ICD-10-CM | POA: Diagnosis not present

## 2016-05-03 DIAGNOSIS — I10 Essential (primary) hypertension: Secondary | ICD-10-CM | POA: Diagnosis not present

## 2016-05-03 DIAGNOSIS — E785 Hyperlipidemia, unspecified: Secondary | ICD-10-CM | POA: Diagnosis not present

## 2016-05-03 DIAGNOSIS — M24041 Loose body in right finger joint(s): Secondary | ICD-10-CM | POA: Diagnosis not present

## 2016-05-03 DIAGNOSIS — E119 Type 2 diabetes mellitus without complications: Secondary | ICD-10-CM | POA: Diagnosis not present

## 2016-05-03 DIAGNOSIS — J45909 Unspecified asthma, uncomplicated: Secondary | ICD-10-CM | POA: Diagnosis not present

## 2016-05-13 DIAGNOSIS — Z09 Encounter for follow-up examination after completed treatment for conditions other than malignant neoplasm: Secondary | ICD-10-CM | POA: Diagnosis not present

## 2016-05-13 DIAGNOSIS — S81811D Laceration without foreign body, right lower leg, subsequent encounter: Secondary | ICD-10-CM | POA: Diagnosis not present

## 2016-05-14 DIAGNOSIS — E782 Mixed hyperlipidemia: Secondary | ICD-10-CM | POA: Diagnosis not present

## 2016-05-14 DIAGNOSIS — F411 Generalized anxiety disorder: Secondary | ICD-10-CM | POA: Diagnosis not present

## 2016-05-14 DIAGNOSIS — I10 Essential (primary) hypertension: Secondary | ICD-10-CM | POA: Diagnosis not present

## 2016-05-14 DIAGNOSIS — M797 Fibromyalgia: Secondary | ICD-10-CM | POA: Diagnosis not present

## 2016-05-14 DIAGNOSIS — R911 Solitary pulmonary nodule: Secondary | ICD-10-CM | POA: Diagnosis not present

## 2016-05-14 DIAGNOSIS — E119 Type 2 diabetes mellitus without complications: Secondary | ICD-10-CM | POA: Diagnosis not present

## 2016-05-14 DIAGNOSIS — J4530 Mild persistent asthma, uncomplicated: Secondary | ICD-10-CM | POA: Diagnosis not present

## 2016-06-04 ENCOUNTER — Other Ambulatory Visit: Payer: Self-pay

## 2016-06-04 NOTE — Patient Outreach (Signed)
[facility] Care Management  06/04/2016  Andrea White 11-08-1955 [id number]  EMMI: Prevent  Referral date: 06/07/16 Referral source: EMMI prevent referral  Referral reason: Score 8   Telephone call to patient regarding EMMI prevent referral. Unable to reach patient. HIPAA compliant voice message left with call back phone number.  PLAN: RNCM will attempt 2nd telephone call to patient within 3 business days.   Quinn Plowman RN,BSN,CCM [facility] Telephonic  [phone]

## 2016-06-06 ENCOUNTER — Other Ambulatory Visit: Payer: Self-pay

## 2016-06-06 NOTE — Patient Outreach (Signed)
[facility]) Care Management  06/06/2016  Andrea White 1955-06-08 [id number]  EMMI: Prevent/ screening Referral date: 06/04/16 Referral source: EMMI prevent Referral reason: Prevent score: 8  Telephone call to patient regarding EMMI prevent screening. HIPAA verified with patient. RNCM discussed and offered [facility] care management services to patient. Patient refused services. Patient states she is managing her diabetes and high blood pressure. Patient states she has transportation and is able to afford her medications. Patient states she sustained a fall approximately 2 months ago with injury.  Patient states she tripped over her granddaughter. Patient denies need for assistance with Advance directive. Patient states she is working on getting and advance directive completed. RNCM advised patient to notify MD of any changes in condition prior to scheduled appointment. RNCM provided contact name and number: [phone] or main office number [phone] and 24 hour nurse advise line [phone].  RNCM verified patient aware of 911 services for urgent/ emergent needs. Patient agreed to receive [facility] care management outreach letter and brochure.  PLAN; RNCM will refer patient to care management assistant to close due to refusal of services.  RNCM will send patient outreach letter and brochure for future reference.   Quinn Plowman RN,BSN,CCM [facility] Telephonic  [phone]     PLAN:

## 2016-06-12 DIAGNOSIS — J208 Acute bronchitis due to other specified organisms: Secondary | ICD-10-CM | POA: Diagnosis not present

## 2016-06-20 DIAGNOSIS — J208 Acute bronchitis due to other specified organisms: Secondary | ICD-10-CM | POA: Diagnosis not present

## 2016-06-24 DIAGNOSIS — R911 Solitary pulmonary nodule: Secondary | ICD-10-CM | POA: Diagnosis not present

## 2016-06-24 DIAGNOSIS — J208 Acute bronchitis due to other specified organisms: Secondary | ICD-10-CM | POA: Diagnosis not present

## 2016-06-28 DIAGNOSIS — M1990 Unspecified osteoarthritis, unspecified site: Secondary | ICD-10-CM | POA: Diagnosis not present

## 2016-07-04 DIAGNOSIS — M1990 Unspecified osteoarthritis, unspecified site: Secondary | ICD-10-CM | POA: Diagnosis not present

## 2016-07-08 DIAGNOSIS — Z1231 Encounter for screening mammogram for malignant neoplasm of breast: Secondary | ICD-10-CM | POA: Diagnosis not present

## 2016-08-15 DIAGNOSIS — H1132 Conjunctival hemorrhage, left eye: Secondary | ICD-10-CM | POA: Diagnosis not present

## 2016-09-13 DIAGNOSIS — E782 Mixed hyperlipidemia: Secondary | ICD-10-CM | POA: Diagnosis not present

## 2016-09-13 DIAGNOSIS — R10814 Left lower quadrant abdominal tenderness: Secondary | ICD-10-CM | POA: Diagnosis not present

## 2016-09-13 DIAGNOSIS — I10 Essential (primary) hypertension: Secondary | ICD-10-CM | POA: Diagnosis not present

## 2016-09-13 DIAGNOSIS — E119 Type 2 diabetes mellitus without complications: Secondary | ICD-10-CM | POA: Diagnosis not present

## 2016-09-13 DIAGNOSIS — M25542 Pain in joints of left hand: Secondary | ICD-10-CM | POA: Diagnosis not present

## 2016-10-30 DIAGNOSIS — M19031 Primary osteoarthritis, right wrist: Secondary | ICD-10-CM | POA: Diagnosis not present

## 2016-11-06 DIAGNOSIS — Z01419 Encounter for gynecological examination (general) (routine) without abnormal findings: Secondary | ICD-10-CM | POA: Diagnosis not present

## 2016-11-06 DIAGNOSIS — Z1211 Encounter for screening for malignant neoplasm of colon: Secondary | ICD-10-CM | POA: Diagnosis not present

## 2016-11-06 DIAGNOSIS — Z6838 Body mass index (BMI) 38.0-38.9, adult: Secondary | ICD-10-CM | POA: Diagnosis not present

## 2016-11-06 DIAGNOSIS — Z1231 Encounter for screening mammogram for malignant neoplasm of breast: Secondary | ICD-10-CM | POA: Diagnosis not present

## 2016-11-12 DIAGNOSIS — Z0001 Encounter for general adult medical examination with abnormal findings: Secondary | ICD-10-CM | POA: Diagnosis not present

## 2016-11-12 DIAGNOSIS — I10 Essential (primary) hypertension: Secondary | ICD-10-CM | POA: Diagnosis not present

## 2016-11-12 DIAGNOSIS — E782 Mixed hyperlipidemia: Secondary | ICD-10-CM | POA: Diagnosis not present

## 2016-11-12 DIAGNOSIS — Z6837 Body mass index (BMI) 37.0-37.9, adult: Secondary | ICD-10-CM | POA: Diagnosis not present

## 2016-11-12 DIAGNOSIS — E119 Type 2 diabetes mellitus without complications: Secondary | ICD-10-CM | POA: Diagnosis not present

## 2017-03-05 DIAGNOSIS — E119 Type 2 diabetes mellitus without complications: Secondary | ICD-10-CM | POA: Diagnosis not present

## 2017-03-05 DIAGNOSIS — E782 Mixed hyperlipidemia: Secondary | ICD-10-CM | POA: Diagnosis not present

## 2017-03-05 DIAGNOSIS — I10 Essential (primary) hypertension: Secondary | ICD-10-CM | POA: Diagnosis not present

## 2017-03-05 DIAGNOSIS — J4520 Mild intermittent asthma, uncomplicated: Secondary | ICD-10-CM | POA: Diagnosis not present

## 2017-03-05 DIAGNOSIS — M797 Fibromyalgia: Secondary | ICD-10-CM | POA: Diagnosis not present

## 2017-03-05 DIAGNOSIS — F411 Generalized anxiety disorder: Secondary | ICD-10-CM | POA: Diagnosis not present

## 2017-03-05 DIAGNOSIS — Z6837 Body mass index (BMI) 37.0-37.9, adult: Secondary | ICD-10-CM | POA: Diagnosis not present

## 2017-03-05 DIAGNOSIS — E1121 Type 2 diabetes mellitus with diabetic nephropathy: Secondary | ICD-10-CM | POA: Diagnosis not present

## 2017-03-05 DIAGNOSIS — M25542 Pain in joints of left hand: Secondary | ICD-10-CM | POA: Diagnosis not present

## 2017-03-06 DIAGNOSIS — M65312 Trigger thumb, left thumb: Secondary | ICD-10-CM | POA: Diagnosis not present

## 2017-03-26 DIAGNOSIS — M545 Low back pain: Secondary | ICD-10-CM | POA: Diagnosis not present

## 2017-03-26 DIAGNOSIS — M47896 Other spondylosis, lumbar region: Secondary | ICD-10-CM | POA: Diagnosis not present

## 2017-03-26 DIAGNOSIS — M791 Myalgia, unspecified site: Secondary | ICD-10-CM | POA: Diagnosis not present

## 2017-03-26 DIAGNOSIS — M542 Cervicalgia: Secondary | ICD-10-CM | POA: Diagnosis not present

## 2017-05-26 DIAGNOSIS — J060 Acute laryngopharyngitis: Secondary | ICD-10-CM | POA: Diagnosis not present

## 2017-05-30 DIAGNOSIS — E119 Type 2 diabetes mellitus without complications: Secondary | ICD-10-CM | POA: Diagnosis not present

## 2017-05-30 DIAGNOSIS — I10 Essential (primary) hypertension: Secondary | ICD-10-CM | POA: Diagnosis not present

## 2017-05-30 DIAGNOSIS — E785 Hyperlipidemia, unspecified: Secondary | ICD-10-CM | POA: Diagnosis not present

## 2017-05-30 DIAGNOSIS — Z86711 Personal history of pulmonary embolism: Secondary | ICD-10-CM | POA: Diagnosis not present

## 2017-05-30 DIAGNOSIS — M1990 Unspecified osteoarthritis, unspecified site: Secondary | ICD-10-CM | POA: Diagnosis not present

## 2017-05-30 DIAGNOSIS — M65312 Trigger thumb, left thumb: Secondary | ICD-10-CM | POA: Diagnosis not present

## 2017-05-30 DIAGNOSIS — Z79899 Other long term (current) drug therapy: Secondary | ICD-10-CM | POA: Diagnosis not present

## 2017-05-30 DIAGNOSIS — J45909 Unspecified asthma, uncomplicated: Secondary | ICD-10-CM | POA: Diagnosis not present

## 2017-05-30 DIAGNOSIS — K219 Gastro-esophageal reflux disease without esophagitis: Secondary | ICD-10-CM | POA: Diagnosis not present

## 2017-05-30 DIAGNOSIS — Z7984 Long term (current) use of oral hypoglycemic drugs: Secondary | ICD-10-CM | POA: Diagnosis not present

## 2017-05-30 DIAGNOSIS — F418 Other specified anxiety disorders: Secondary | ICD-10-CM | POA: Diagnosis not present

## 2017-05-30 DIAGNOSIS — M6588 Other synovitis and tenosynovitis, other site: Secondary | ICD-10-CM | POA: Diagnosis not present

## 2017-06-05 DIAGNOSIS — Z6837 Body mass index (BMI) 37.0-37.9, adult: Secondary | ICD-10-CM | POA: Diagnosis not present

## 2017-06-05 DIAGNOSIS — J208 Acute bronchitis due to other specified organisms: Secondary | ICD-10-CM | POA: Diagnosis not present

## 2017-06-05 DIAGNOSIS — E1121 Type 2 diabetes mellitus with diabetic nephropathy: Secondary | ICD-10-CM | POA: Diagnosis not present

## 2017-06-05 DIAGNOSIS — E782 Mixed hyperlipidemia: Secondary | ICD-10-CM | POA: Diagnosis not present

## 2017-06-05 DIAGNOSIS — J4520 Mild intermittent asthma, uncomplicated: Secondary | ICD-10-CM | POA: Diagnosis not present

## 2017-06-05 DIAGNOSIS — M25542 Pain in joints of left hand: Secondary | ICD-10-CM | POA: Diagnosis not present

## 2017-06-05 DIAGNOSIS — M797 Fibromyalgia: Secondary | ICD-10-CM | POA: Diagnosis not present

## 2017-06-05 DIAGNOSIS — I10 Essential (primary) hypertension: Secondary | ICD-10-CM | POA: Diagnosis not present

## 2017-06-05 DIAGNOSIS — M25541 Pain in joints of right hand: Secondary | ICD-10-CM | POA: Diagnosis not present

## 2017-06-05 DIAGNOSIS — F411 Generalized anxiety disorder: Secondary | ICD-10-CM | POA: Diagnosis not present

## 2017-06-09 DIAGNOSIS — S20461A Insect bite (nonvenomous) of right back wall of thorax, initial encounter: Secondary | ICD-10-CM | POA: Diagnosis not present

## 2017-06-09 DIAGNOSIS — L03312 Cellulitis of back [any part except buttock]: Secondary | ICD-10-CM | POA: Diagnosis not present

## 2017-07-11 DIAGNOSIS — J4540 Moderate persistent asthma, uncomplicated: Secondary | ICD-10-CM | POA: Diagnosis not present

## 2017-07-11 DIAGNOSIS — J018 Other acute sinusitis: Secondary | ICD-10-CM | POA: Diagnosis not present

## 2017-07-15 DIAGNOSIS — Z1231 Encounter for screening mammogram for malignant neoplasm of breast: Secondary | ICD-10-CM | POA: Diagnosis not present

## 2017-09-05 DIAGNOSIS — S8992XA Unspecified injury of left lower leg, initial encounter: Secondary | ICD-10-CM | POA: Diagnosis not present

## 2017-09-05 DIAGNOSIS — S79922A Unspecified injury of left thigh, initial encounter: Secondary | ICD-10-CM | POA: Diagnosis not present

## 2017-09-05 DIAGNOSIS — S8391XA Sprain of unspecified site of right knee, initial encounter: Secondary | ICD-10-CM | POA: Diagnosis not present

## 2017-09-11 DIAGNOSIS — M25562 Pain in left knee: Secondary | ICD-10-CM | POA: Diagnosis not present

## 2017-09-11 DIAGNOSIS — E782 Mixed hyperlipidemia: Secondary | ICD-10-CM | POA: Diagnosis not present

## 2017-09-11 DIAGNOSIS — F411 Generalized anxiety disorder: Secondary | ICD-10-CM | POA: Diagnosis not present

## 2017-09-11 DIAGNOSIS — I10 Essential (primary) hypertension: Secondary | ICD-10-CM | POA: Diagnosis not present

## 2017-09-11 DIAGNOSIS — J4520 Mild intermittent asthma, uncomplicated: Secondary | ICD-10-CM | POA: Diagnosis not present

## 2017-09-11 DIAGNOSIS — Z6837 Body mass index (BMI) 37.0-37.9, adult: Secondary | ICD-10-CM | POA: Diagnosis not present

## 2017-09-11 DIAGNOSIS — M797 Fibromyalgia: Secondary | ICD-10-CM | POA: Diagnosis not present

## 2017-09-11 DIAGNOSIS — E1121 Type 2 diabetes mellitus with diabetic nephropathy: Secondary | ICD-10-CM | POA: Diagnosis not present

## 2017-09-18 ENCOUNTER — Other Ambulatory Visit: Payer: Self-pay

## 2017-09-18 DIAGNOSIS — S8002XA Contusion of left knee, initial encounter: Secondary | ICD-10-CM | POA: Diagnosis not present

## 2017-09-18 NOTE — Patient Outreach (Signed)
[facility]) Care Management  09/18/2017  DARSHAY DEUPREE Nov 26, 1955 [id number]    TELEPHONE SCREENING Referral date: 09/09/17 Referral source: utilization management referral Referral reason: medication assistance Insurance: [employer] Attempt #1   Telephone call to patient regarding referral. Unable to reach patient. HIPAA compliant voice message left with call back phone number.   PLAN: RNCM will attempt 2nd telephone call to patient within 4 business days. RNCM will send outreach letter.   Quinn Plowman RN,BSN,CCM [facility] Telephonic  [phone]

## 2017-09-25 ENCOUNTER — Other Ambulatory Visit: Payer: Self-pay

## 2017-09-25 NOTE — Patient Outreach (Signed)
[facility]) Care Management  09/25/2017  Andrea White [birth year] [id number]  TELEPHONE SCREENING Referral date: 09/09/17 Referral source: utilization management referral Referral reason: medication assistance Insurance: [employer]  Telephone call to patient regarding utilization management referral. HPAA verified with patient. Explained reason for call. Patient states she would like her copay for her Cymbalta to be cheaper.  She states her other medications are $10 for a 3 month supply and her Cymbalta is $10 per month.   Patient states she would like additional information and assistance for an Advance directive.  RNCM discussed and offered [facility] care management services. Patient verbally agreed.   PLAN: RNCM will refer patient to social worker and pharmacist.   Quinn Plowman RN,BSN,CCM [facility] Telephonic  [phone]

## 2017-09-26 ENCOUNTER — Other Ambulatory Visit: Payer: Self-pay | Admitting: Pharmacist

## 2017-09-26 NOTE — Patient Outreach (Addendum)
[facility]) Care Management  09/26/2017   Andrea White [birth year] [id number]  Subjective:   62 year old female referred to [place] Management by Utilization Management.  [place] services requested for medication assistance.  PMHx includes, but not limited to, hx MI, GERD.  Received referral regarding the copay of Ms. Crittendon's Cymbalta (duloxetine). She is concerned about the cost, and has been taking the medication every other day to make her supply last longer. She notes that she is taking this medication for fibromyalgia.    Objective:   Current Medications:  Current Outpatient Medications  Medication Sig Dispense Refill  . acyclovir (ZOVIRAX) 400 MG tablet Take 400 mg by mouth 5 (five) times daily.    Marland Kitchen albuterol (PROVENTIL HFA;VENTOLIN HFA) 108 (90 Base) MCG/ACT inhaler Inhale into the lungs every 6 (six) hours as needed for wheezing or shortness of breath.    . ALPRAZolam (XANAX) 0.5 MG tablet Take 0.5 mg by mouth 3 (three) times daily as needed. For anxiety    . Biotin 10000 MCG TABS Take 1 tablet by mouth daily.    Marland Kitchen diltiazem (CARDIZEM SR) 90 MG 12 hr capsule Take 90 mg by mouth 2 (two) times daily.     . DULoxetine (CYMBALTA) 30 MG capsule     . ibuprofen (ADVIL,MOTRIN) 200 MG tablet Take 200 mg by mouth every 6 (six) hours as needed.    . loratadine (CLARITIN) 10 MG tablet Take 10 mg by mouth daily.    Marland Kitchen losartan (COZAAR) 50 MG tablet Take 50 mg by mouth daily.    Marland Kitchen omeprazole (PRILOSEC) 40 MG capsule Take 40 mg by mouth 2 (two) times daily.    . polyethylene glycol (MIRALAX / GLYCOLAX) packet Take 17 g by mouth 2 (two) times daily.     Marland Kitchen POTASSIUM GLUCONATE PO Take 2 tablets by mouth daily.     . pravastatin (PRAVACHOL) 20 MG tablet Take 20 mg by mouth daily.    . Probiotic Product (PROBIOTIC DAILY PO) Take 1 tablet by mouth daily.    Marland Kitchen spironolactone-hydrochlorothiazide (ALDACTAZIDE) 25-25 MG per tablet Take 1 tablet by mouth daily.    .  traMADol (ULTRAM) 50 MG tablet Take by mouth every 6 (six) hours as needed.    Marland Kitchen oxybutynin (DITROPAN) 5 MG tablet Take 5 mg by mouth 2 (two) times daily.      No current facility-administered medications for this visit.    Per [facility]:  CrCl ~ 60 mL/min, eGFR ~ 77 mL/min/1.87m A1C 6.7% (06/05/2017) LDL 82, HDL 46, TG 111, TC 150 (06/05/2017) Urine Microalbumin 150 mg/G (06/05/2017)  Functional Status:  No flowsheet data found.  Fall/Depression Screening: No flowsheet data found. PHQ 2/9 Scores 09/25/2017 06/06/2016  PHQ - 2 Score 0 0    Assessment:   Drugs sorted by system:  Neurologic/Psychologic: alprazolam, duloxetine  Cardiovascular: diltiazem, losartan, pravastatin, spironolactone/HCTZ  Pulmonary/Allergy: albuterol HFA, loratadine  Gastrointestinal: omeprazole, Miralax, probiotic   Endocrine: metformin   Pain: ibuprofen, tramadol  Vitamins/Minerals/Supplements: biotin, potassium  Infectious Diseases: acyclovir   Gaps in therapy:  - ASCVD Risk Reduction: Patient has "hx of heart attack" on her problem list in Epic; if she does not have a hx of ASCVD, she has an ASCVD risk score >20%; per ADA guidelines, consider increasing to high intensity statin therapy d/t either secondary prevention or high risk in DM  Other issues noted:   Medication Assistance: Extra Help: - Patient discussed her household income with me. May qualify  for Partial Extra Help  Patient Assistance Programs: - Patient notes that she already receives albuterol HFA from a Patient Assistance Program  Copay Concerns:  - Per [employer] formulary, duloxetine is a Tier 2 medication. PCP could consider completing a Tier Exception request to move duloxetine to Tier 1 for this patient  Plan:  - Completed Medicare Extra Help/LIS application over the phone with patient. Educated that she should receive a determination in the mail in 3-4 weeks.  - Will fax Coverage Determination Request Form (Tier  Exception) to patient's PCP, [name]. - Will f/u with patient in 7-10 business days   Catie Darnelle Maffucci, PharmD PGY2 Ambulatory Care Pharmacy Resident Phone: [phone]

## 2017-09-30 ENCOUNTER — Other Ambulatory Visit: Payer: Self-pay

## 2017-09-30 NOTE — Patient Outreach (Signed)
[facility] [facility]) Care Management  09/30/2017  FRANCENE MCERLEAN [birth year] [id number]  Successful outreach to the patient on today's date, HIPAA identifiers confirmed. BSW introduced self to the patient and the reason for today's call, indicating BSW received a referral to assist the patient with completion of advance directives. The patient denies any questions surrounding this document and states "I printed one off the Internet and have it filled out. I just don't know what to do with it". The patient is able to confirm this document has both a healthcare agent as well as a living will included.  BSW explained to the patient the process of having the document notarized and providing copies to those listed as her healthcare agent as well as her medical providers. BSW explained to the patient to keep the original. The patient stated understanding.  BSW will resolve social work episode as no further needs.  Daneen Schick, BSW, CDP [facility] [phone]

## 2017-10-09 DIAGNOSIS — S8002XA Contusion of left knee, initial encounter: Secondary | ICD-10-CM | POA: Diagnosis not present

## 2017-10-10 ENCOUNTER — Other Ambulatory Visit: Payer: Self-pay | Admitting: Pharmacist

## 2017-10-10 NOTE — Patient Outreach (Signed)
[place] [facility]) Care Management  10/10/2017  ARTICE HOLOHAN 1955/01/28 [id number]   Contacted patient to f/u on medication assistance. HIPAA verifiers identified. She has not received any Medicare Extra Help determination information from Medicare yet. Additionally, she has not heard anything from Dr. Tobie Poet regarding completing a Tier Exception request for duloxetine.   Patient has an appointment with Dr. Tobie Poet on Monday; she will discuss this with Dr. Tobie Poet at that time.   Plan:  - F/u with patient in 2-3 weeks regarding Medicare Low Income Subsidy determination.   Catie Darnelle Maffucci, PharmD PGY2 Ambulatory Care Pharmacy Resident Phone: [phone]

## 2017-10-13 DIAGNOSIS — D72829 Elevated white blood cell count, unspecified: Secondary | ICD-10-CM | POA: Diagnosis not present

## 2017-10-14 ENCOUNTER — Other Ambulatory Visit: Payer: Self-pay | Admitting: Pharmacist

## 2017-10-14 NOTE — Patient Outreach (Signed)
[place]) Care Management  10/14/2017  Andrea White 03-12-1955 [id number]   Received call from patient, HIPAA verifiers identified.   She received Medicare LIS DENIAL from [facility]. Unfortunately, they determined her assets are too much to qualify.   She noted that she had an appointment with PCP this morning. She noted that a nurse had faxed back the Coverage Determination request form. I did not receive anything, but it could have been faxed into [employer], the [employer], as that fax number appears on the form.   Called PCP's office, left a message with Hoyle Sauer, RN to give me a call back.   Catie Darnelle Maffucci, PharmD PGY2 Ambulatory Care Pharmacy Resident Phone: [phone]

## 2017-10-17 ENCOUNTER — Other Ambulatory Visit: Payer: Self-pay | Admitting: Pharmacist

## 2017-10-17 NOTE — Patient Outreach (Signed)
[facility]) Care Management  10/17/2017  Andrea White [birth year] [id number]   Contacted Dr. Alyse Low office, spoke with Hoyle Sauer, [place]. She noted that the Tier Exception form had been submitted to [employer], the [employer].   Contacted patient, HIPAA verifiers identified. Discussed that it would be up to [employer] to make a determination. Counseled that if it was not approved, she and Dr. Tobie Poet could look at alternative Tier 1 options on the [employer] formulary. Patient voiced that she had received a copy of the formulary the other day, and understood.   Plan - Will close case d/t goals being met; Will send case closure letter to MD.   Courtney Heys, PharmD PGY2 Ambulatory Care Pharmacy Resident Phone: [phone]

## 2017-10-22 ENCOUNTER — Encounter: Payer: PPO | Admitting: Sports Medicine

## 2017-10-22 NOTE — Progress Notes (Signed)
This encounter was created in error - please disregard.

## 2017-10-31 ENCOUNTER — Encounter: Payer: Self-pay | Admitting: Sports Medicine

## 2017-10-31 ENCOUNTER — Other Ambulatory Visit: Payer: Self-pay

## 2017-10-31 ENCOUNTER — Ambulatory Visit: Payer: Self-pay | Admitting: Pharmacist

## 2017-10-31 ENCOUNTER — Ambulatory Visit (INDEPENDENT_AMBULATORY_CARE_PROVIDER_SITE_OTHER): Payer: PPO | Admitting: Sports Medicine

## 2017-10-31 ENCOUNTER — Ambulatory Visit (INDEPENDENT_AMBULATORY_CARE_PROVIDER_SITE_OTHER): Payer: PPO

## 2017-10-31 DIAGNOSIS — M79671 Pain in right foot: Secondary | ICD-10-CM | POA: Diagnosis not present

## 2017-10-31 DIAGNOSIS — M2142 Flat foot [pes planus] (acquired), left foot: Secondary | ICD-10-CM

## 2017-10-31 DIAGNOSIS — M19072 Primary osteoarthritis, left ankle and foot: Secondary | ICD-10-CM

## 2017-10-31 DIAGNOSIS — M2141 Flat foot [pes planus] (acquired), right foot: Secondary | ICD-10-CM

## 2017-10-31 DIAGNOSIS — M79672 Pain in left foot: Secondary | ICD-10-CM

## 2017-10-31 DIAGNOSIS — M19071 Primary osteoarthritis, right ankle and foot: Secondary | ICD-10-CM | POA: Diagnosis not present

## 2017-10-31 DIAGNOSIS — M779 Enthesopathy, unspecified: Secondary | ICD-10-CM | POA: Diagnosis not present

## 2017-10-31 DIAGNOSIS — I219 Acute myocardial infarction, unspecified: Secondary | ICD-10-CM | POA: Insufficient documentation

## 2017-10-31 DIAGNOSIS — M797 Fibromyalgia: Secondary | ICD-10-CM | POA: Insufficient documentation

## 2017-10-31 NOTE — Progress Notes (Signed)
Subjective: Andrea White is a 62 y.o. female patient who presents to office for evaluation of bilateral foot pain. Patient complains of progressive pain especially over the last 2 months in the left greater than right foot states that the pain is sharp worse with walking or standing states that she did have a recent injury back in August where the car door hit her leg on the left and reports that she always has a lot of trouble out of her left foot and left leg states that since the pain has been happening has been icing and using Advil which seemed to help however she came today to office because she was concerned about the swelling that she is experiencing in her toes.  Patient denies calf pain shortness of breath nausea vomiting fever chills or any other constitutional symptoms at this time.  Patient Active Problem List   Diagnosis Date Noted  . Myocardial infarction ([place]) 10/31/2017  . Primary fibromyalgia syndrome 10/31/2017  . Gastroesophageal reflux disease 03/15/2015  . Asthma 03/15/2015  . Obesity 03/15/2015  . Herniated disc 06/10/2011  . History of heart attack 06/10/2011  . Cyst of ovary     Current Outpatient Medications on File Prior to Visit  Medication Sig Dispense Refill  . albuterol (PROVENTIL HFA;VENTOLIN HFA) 108 (90 Base) MCG/ACT inhaler Inhale into the lungs every 6 (six) hours as needed for wheezing or shortness of breath.    . ALPRAZolam (XANAX) 0.5 MG tablet Take 0.5 mg by mouth 3 (three) times daily as needed. For anxiety    . Biotin 10000 MCG TABS Take 1 tablet by mouth daily.    Marland Kitchen diltiazem (CARDIZEM SR) 90 MG 12 hr capsule Take 90 mg by mouth 2 (two) times daily.     . DULoxetine (CYMBALTA) 30 MG capsule     . ibuprofen (ADVIL,MOTRIN) 200 MG tablet Take 200 mg by mouth every 6 (six) hours as needed.    . loratadine (CLARITIN) 10 MG tablet Take 10 mg by mouth daily.    Marland Kitchen losartan (COZAAR) 50 MG tablet Take 50 mg by mouth daily.    . metFORMIN (GLUCOPHAGE) 500  MG tablet Take 500 mg by mouth 2 (two) times daily with a meal.    . omeprazole (PRILOSEC) 40 MG capsule Take 40 mg by mouth 2 (two) times daily.    . polyethylene glycol (MIRALAX / GLYCOLAX) packet Take 17 g by mouth 2 (two) times daily.     Marland Kitchen POTASSIUM GLUCONATE PO Take 2 tablets by mouth daily.     . pravastatin (PRAVACHOL) 20 MG tablet Take 20 mg by mouth daily.    . Probiotic Product (PROBIOTIC DAILY PO) Take 1 tablet by mouth daily.    Marland Kitchen spironolactone-hydrochlorothiazide (ALDACTAZIDE) 25-25 MG per tablet Take 1 tablet by mouth daily.    . traMADol (ULTRAM) 50 MG tablet Take by mouth every 6 (six) hours as needed.     No current facility-administered medications on file prior to visit.     Allergies  Allergen Reactions  . Morphine And Related Other (See Comments)    Woozy, "out of body experience"  . Celebrex [Celecoxib] Other (See Comments)    Patient does not remember intolerance  . Lisinopril Cough  . Guaifenesin & Derivatives Other (See Comments)    Patient cannot remember specific issue, just made her feel "weird"  . Sulfa Antibiotics Rash  . Valium [Diazepam] Other (See Comments)    Lightheaded, dizziness     Objective:  General:  Alert and oriented x3 in no acute distress  Dermatology: Well-healed scars bilateral lower extremities with no signs of infection, no open lesions bilateral lower extremities, no webspace macerations, no ecchymosis bilateral, all nails x 10 are short and thick but well manicured.  Vascular: Dorsalis Pedis and Posterior Tibial pedal pulses faint 1 out of 4, capillary Fill Time 5 seconds, no pedal hair growth bilateral, trace edema bilateral lower extremities, Temperature gradient mildly decreased bilateral.  Neurology: Gross sensation intact via light touch bilateral.  Musculoskeletal: Mild tenderness with palpation at left greater than right dorsal midfoot, significant pes planus deformity, lesser hammertoe deformity, strength within normal  limits for patient status.    Xrays  Right and left foot   Impression: Normal osseous mineralization, there is severe joint space narrowing especially at the midtarsal joint supportive of pes planus deformity with bone spur supportive of arthritis.  There is digital contractures supportive of hammertoe deformity.  There is evidence of previous left fifth metatarsal surgery, no other acute findings,  Assessment and Plan: Problem List Items Addressed This Visit    None    Visit Diagnoses    Bilateral foot pain    -  Primary   Relevant Orders   DG Foot Complete Right   DG Foot Complete Left   Osteoarthritis of both feet, unspecified osteoarthritis type       Capsulitis       Pes planus of both feet           -Complete examination performed -Xrays reviewed -Discussed treatement options for likely worsening arthritis and capsulitis secondary to walking differently after having injury of left foot and leg -Patient declines steroid shot at this visit -Advised patient to continue with rest, ice, elevation, gentle compression, and Aleve -Recommend good supportive shoes daily for foot type -Patient to return to office as needed or sooner if condition worsens.  Landis Martins, DPM

## 2017-11-03 ENCOUNTER — Other Ambulatory Visit: Payer: Self-pay | Admitting: Sports Medicine

## 2017-11-03 DIAGNOSIS — M19071 Primary osteoarthritis, right ankle and foot: Secondary | ICD-10-CM

## 2017-11-03 DIAGNOSIS — M2142 Flat foot [pes planus] (acquired), left foot: Secondary | ICD-10-CM

## 2017-11-03 DIAGNOSIS — M2141 Flat foot [pes planus] (acquired), right foot: Secondary | ICD-10-CM

## 2017-11-03 DIAGNOSIS — M79672 Pain in left foot: Secondary | ICD-10-CM

## 2017-11-03 DIAGNOSIS — M79671 Pain in right foot: Secondary | ICD-10-CM

## 2017-11-03 DIAGNOSIS — M19072 Primary osteoarthritis, left ankle and foot: Secondary | ICD-10-CM

## 2017-11-05 DIAGNOSIS — Z23 Encounter for immunization: Secondary | ICD-10-CM | POA: Diagnosis not present

## 2017-11-24 DIAGNOSIS — J4531 Mild persistent asthma with (acute) exacerbation: Secondary | ICD-10-CM | POA: Diagnosis not present

## 2017-12-09 DIAGNOSIS — S60511A Abrasion of right hand, initial encounter: Secondary | ICD-10-CM | POA: Diagnosis not present

## 2017-12-09 DIAGNOSIS — W06XXXA Fall from bed, initial encounter: Secondary | ICD-10-CM | POA: Diagnosis not present

## 2017-12-09 DIAGNOSIS — R05 Cough: Secondary | ICD-10-CM | POA: Diagnosis not present

## 2017-12-09 DIAGNOSIS — S0083XA Contusion of other part of head, initial encounter: Secondary | ICD-10-CM | POA: Diagnosis not present

## 2017-12-16 DIAGNOSIS — M797 Fibromyalgia: Secondary | ICD-10-CM | POA: Diagnosis not present

## 2017-12-16 DIAGNOSIS — E1121 Type 2 diabetes mellitus with diabetic nephropathy: Secondary | ICD-10-CM | POA: Diagnosis not present

## 2017-12-16 DIAGNOSIS — E782 Mixed hyperlipidemia: Secondary | ICD-10-CM | POA: Diagnosis not present

## 2017-12-16 DIAGNOSIS — I10 Essential (primary) hypertension: Secondary | ICD-10-CM | POA: Diagnosis not present

## 2017-12-16 DIAGNOSIS — F411 Generalized anxiety disorder: Secondary | ICD-10-CM | POA: Diagnosis not present

## 2018-01-07 DIAGNOSIS — G894 Chronic pain syndrome: Secondary | ICD-10-CM | POA: Diagnosis not present

## 2018-01-07 DIAGNOSIS — M7918 Myalgia, other site: Secondary | ICD-10-CM | POA: Diagnosis not present

## 2018-01-07 DIAGNOSIS — M5033 Other cervical disc degeneration, cervicothoracic region: Secondary | ICD-10-CM | POA: Diagnosis not present

## 2018-01-08 DIAGNOSIS — S0990XA Unspecified injury of head, initial encounter: Secondary | ICD-10-CM | POA: Diagnosis not present

## 2018-01-08 DIAGNOSIS — S199XXA Unspecified injury of neck, initial encounter: Secondary | ICD-10-CM | POA: Diagnosis not present

## 2018-01-08 DIAGNOSIS — R51 Headache: Secondary | ICD-10-CM | POA: Diagnosis not present

## 2018-01-08 DIAGNOSIS — M542 Cervicalgia: Secondary | ICD-10-CM | POA: Diagnosis not present

## 2018-01-08 DIAGNOSIS — R079 Chest pain, unspecified: Secondary | ICD-10-CM | POA: Diagnosis not present

## 2018-01-08 DIAGNOSIS — S299XXA Unspecified injury of thorax, initial encounter: Secondary | ICD-10-CM | POA: Diagnosis not present

## 2018-02-04 DIAGNOSIS — Z79899 Other long term (current) drug therapy: Secondary | ICD-10-CM | POA: Diagnosis not present

## 2018-02-04 DIAGNOSIS — M7918 Myalgia, other site: Secondary | ICD-10-CM | POA: Diagnosis not present

## 2018-02-04 DIAGNOSIS — M5033 Other cervical disc degeneration, cervicothoracic region: Secondary | ICD-10-CM | POA: Diagnosis not present

## 2018-02-04 DIAGNOSIS — Z79891 Long term (current) use of opiate analgesic: Secondary | ICD-10-CM | POA: Diagnosis not present

## 2018-02-04 DIAGNOSIS — G894 Chronic pain syndrome: Secondary | ICD-10-CM | POA: Diagnosis not present

## 2018-02-26 ENCOUNTER — Ambulatory Visit: Payer: PPO | Admitting: Sports Medicine

## 2018-02-26 ENCOUNTER — Encounter: Payer: Self-pay | Admitting: Sports Medicine

## 2018-02-26 DIAGNOSIS — M2040 Other hammer toe(s) (acquired), unspecified foot: Secondary | ICD-10-CM

## 2018-02-26 DIAGNOSIS — M21619 Bunion of unspecified foot: Secondary | ICD-10-CM

## 2018-02-26 DIAGNOSIS — M79674 Pain in right toe(s): Secondary | ICD-10-CM | POA: Diagnosis not present

## 2018-02-26 DIAGNOSIS — M7751 Other enthesopathy of right foot: Secondary | ICD-10-CM | POA: Diagnosis not present

## 2018-02-26 DIAGNOSIS — M67472 Ganglion, left ankle and foot: Secondary | ICD-10-CM

## 2018-02-26 DIAGNOSIS — M79675 Pain in left toe(s): Secondary | ICD-10-CM | POA: Diagnosis not present

## 2018-02-26 DIAGNOSIS — E1142 Type 2 diabetes mellitus with diabetic polyneuropathy: Secondary | ICD-10-CM | POA: Diagnosis not present

## 2018-02-26 DIAGNOSIS — M19072 Primary osteoarthritis, left ankle and foot: Secondary | ICD-10-CM

## 2018-02-26 DIAGNOSIS — B351 Tinea unguium: Secondary | ICD-10-CM | POA: Diagnosis not present

## 2018-02-26 DIAGNOSIS — M2142 Flat foot [pes planus] (acquired), left foot: Secondary | ICD-10-CM

## 2018-02-26 DIAGNOSIS — M779 Enthesopathy, unspecified: Secondary | ICD-10-CM

## 2018-02-26 DIAGNOSIS — M19071 Primary osteoarthritis, right ankle and foot: Secondary | ICD-10-CM

## 2018-02-26 DIAGNOSIS — M2141 Flat foot [pes planus] (acquired), right foot: Secondary | ICD-10-CM

## 2018-02-26 MED ORDER — TRIAMCINOLONE ACETONIDE 40 MG/ML IJ SUSP
20.0000 mg | Freq: Once | INTRAMUSCULAR | Status: AC
  Administered 2018-02-26: 20 mg

## 2018-02-26 NOTE — Progress Notes (Signed)
Subjective: Andrea White is a 63 y.o. female patient who presents to office for evaluation of  foot pain. Patient complains of progressive pain especially over the last 3 months at the side of her heels and to the top of her left foot.  Patient reports that the pain is a stabbing pain in nature with dry skin at the heels of which she has been using over-the-counter muscle rub that seems to help.  Patient does not want x-rays this visit and reports that her pain is similar to what she has had before.  Denies injury/trip/fall/sprain/any causative factors.   Patient is diabetic her last blood sugar this morning was 140 and last A1c 6.7 will see her primary care doctor again on March 2.  Patient Active Problem List   Diagnosis Date Noted  . Myocardial infarction ([place]) 10/31/2017  . Primary fibromyalgia syndrome 10/31/2017  . Gastroesophageal reflux disease 03/15/2015  . Asthma 03/15/2015  . Obesity 03/15/2015  . Herniated disc 06/10/2011  . History of heart attack 06/10/2011  . Cyst of ovary     Current Outpatient Medications on File Prior to Visit  Medication Sig Dispense Refill  . albuterol (PROVENTIL HFA;VENTOLIN HFA) 108 (90 Base) MCG/ACT inhaler Inhale into the lungs every 6 (six) hours as needed for wheezing or shortness of breath.    . ALPRAZolam (XANAX) 0.5 MG tablet Take 0.5 mg by mouth 3 (three) times daily as needed. For anxiety    . Biotin 10000 MCG TABS Take 1 tablet by mouth daily.    Marland Kitchen diltiazem (CARDIZEM SR) 90 MG 12 hr capsule Take 90 mg by mouth 2 (two) times daily.     . DULoxetine (CYMBALTA) 30 MG capsule     . ibuprofen (ADVIL,MOTRIN) 200 MG tablet Take 200 mg by mouth every 6 (six) hours as needed.    . loratadine (CLARITIN) 10 MG tablet Take 10 mg by mouth daily.    Marland Kitchen losartan (COZAAR) 50 MG tablet Take 50 mg by mouth daily.    . meclizine (ANTIVERT) 25 MG tablet     . metFORMIN (GLUCOPHAGE) 500 MG tablet Take 500 mg by mouth 2 (two) times daily with a meal.    .  omeprazole (PRILOSEC) 40 MG capsule Take 40 mg by mouth 2 (two) times daily.    . ONE TOUCH ULTRA TEST test strip     . ONETOUCH DELICA LANCETS 37S MISC     . polyethylene glycol (MIRALAX / GLYCOLAX) packet Take 17 g by mouth 2 (two) times daily.     Marland Kitchen POTASSIUM GLUCONATE PO Take 2 tablets by mouth daily.     . pravastatin (PRAVACHOL) 20 MG tablet Take 20 mg by mouth daily.    . Probiotic Product (PROBIOTIC DAILY PO) Take 1 tablet by mouth daily.    Marland Kitchen spironolactone-hydrochlorothiazide (ALDACTAZIDE) 25-25 MG per tablet Take 1 tablet by mouth daily.    . traMADol (ULTRAM) 50 MG tablet Take by mouth every 6 (six) hours as needed.     No current facility-administered medications on file prior to visit.     Allergies  Allergen Reactions  . Morphine And Related Other (See Comments)    Woozy, "out of body experience"  . Celebrex [Celecoxib] Other (See Comments)    Patient does not remember intolerance  . Lisinopril Cough  . Guaifenesin & Derivatives Other (See Comments)    Patient cannot remember specific issue, just made her feel "weird"  . Sulfa Antibiotics Rash  . Valium [Diazepam] Other (  See Comments)    Lightheaded, dizziness     Objective:  General: Alert and oriented x3 in no acute distress  Dermatology: Nails x10 elongated thickened dystrophic consistent with onychomycosis.  Dry heels bilateral with no signs of infection.  There is a raised soft tissue mass measures less than 1.5 cm at the dorsal midfoot along the extensor tendon that corresponds with the fourth toe consistent with cyst, no webspace macerations, no ecchymosis bilateral, all nails x 10 are well manicured.  Vascular: Dorsalis Pedis and Posterior Tibial pedal pulses palpable, Capillary Fill Time 5 seconds,(+) pedal hair growth bilateral, no edema bilateral lower extremities, Temperature gradient within normal limits.  Neurology: Johney Maine sensation intact via light touch bilateral, Protective sensation intact bilateral  with Semmes Weinstein monofilament however vibratory sensation diminished bilateral.  Musculoskeletal: Mild tenderness with palpation at peroneal tendon course right greater than left, pes planus foot type, bunion and hammertoe deformity that is currently asymptomatic. Gait: Antalgic gait   Assessment and Plan: Problem List Items Addressed This Visit    None    Visit Diagnoses    Tendonitis of ankle, right    -  Primary   Relevant Medications   triamcinolone acetonide (KENALOG-40) injection 20 mg (Completed)   Ganglion cyst of left foot       Osteoarthritis of both feet, unspecified osteoarthritis type       Relevant Medications   triamcinolone acetonide (KENALOG-40) injection 20 mg (Completed)   Capsulitis       Pain due to onychomycosis of toenails of both feet       Diabetic polyneuropathy associated with type 2 diabetes mellitus (HCC)       Pes planus of both feet       Bunion       Hammer toe, unspecified laterality          -Complete examination performed -Patient declined x-rays this visit -Discussed treatement options tendinitis, cyst, arthritis, capsulitis, and painful toenails -After oral consent and aseptic prep, injected a mixture containing 1 ml of 2%  plain lidocaine, 1 ml 0.5% plain marcaine, 0.5 ml of kenalog 10 and 0.5 ml of dexamethasone phosphate into right peroneal tendon course without complication. Post-injection care discussed with patient.  Recommend rest ice elevation and good supportive shoes and may continue with topical pain cream or rub as needed -Advised patient to closely monitor ganglion cyst since patient refused aspiration or steroid injection at the site this visit I advised patient if cyst becomes larger than 1-1/2 cm to return to office for further evaluation likely will require ultrasound and excision if worsens -Mechanically debrided nails x10 using a sterile nipper without incident -Handicap placard paperwork completed -[employer] diabetic  shoe order form was completed; office to contact primary care for approval / certification;  Office to arrange shoe fitting and dispensing. -Patient to return to office for diabetic shoe measurements when called or 3 months for routine foot care or sooner if condition worsens.  Landis Martins, DPM

## 2018-03-16 DIAGNOSIS — M791 Myalgia, unspecified site: Secondary | ICD-10-CM | POA: Diagnosis not present

## 2018-03-16 DIAGNOSIS — M549 Dorsalgia, unspecified: Secondary | ICD-10-CM | POA: Diagnosis not present

## 2018-03-16 DIAGNOSIS — G894 Chronic pain syndrome: Secondary | ICD-10-CM | POA: Diagnosis not present

## 2018-03-16 DIAGNOSIS — M5033 Other cervical disc degeneration, cervicothoracic region: Secondary | ICD-10-CM | POA: Diagnosis not present

## 2018-03-19 ENCOUNTER — Other Ambulatory Visit: Payer: Self-pay | Admitting: Orthopaedic Surgery

## 2018-03-19 DIAGNOSIS — G894 Chronic pain syndrome: Secondary | ICD-10-CM

## 2018-03-23 DIAGNOSIS — E782 Mixed hyperlipidemia: Secondary | ICD-10-CM | POA: Diagnosis not present

## 2018-03-23 DIAGNOSIS — I10 Essential (primary) hypertension: Secondary | ICD-10-CM | POA: Diagnosis not present

## 2018-03-23 DIAGNOSIS — M797 Fibromyalgia: Secondary | ICD-10-CM | POA: Diagnosis not present

## 2018-03-23 DIAGNOSIS — F411 Generalized anxiety disorder: Secondary | ICD-10-CM | POA: Diagnosis not present

## 2018-03-23 DIAGNOSIS — E1121 Type 2 diabetes mellitus with diabetic nephropathy: Secondary | ICD-10-CM | POA: Diagnosis not present

## 2018-03-23 DIAGNOSIS — E1142 Type 2 diabetes mellitus with diabetic polyneuropathy: Secondary | ICD-10-CM | POA: Diagnosis not present

## 2018-04-02 ENCOUNTER — Other Ambulatory Visit: Payer: Self-pay

## 2018-04-02 ENCOUNTER — Ambulatory Visit
Admission: RE | Admit: 2018-04-02 | Discharge: 2018-04-02 | Disposition: A | Discharge status: Home / Self Care | Payer: PPO | Source: Ambulatory Visit | Attending: Orthopaedic Surgery | Admitting: Orthopaedic Surgery

## 2018-04-02 DIAGNOSIS — M48061 Spinal stenosis, lumbar region without neurogenic claudication: Secondary | ICD-10-CM | POA: Diagnosis not present

## 2018-04-02 DIAGNOSIS — G894 Chronic pain syndrome: Secondary | ICD-10-CM

## 2018-04-16 DIAGNOSIS — M5136 Other intervertebral disc degeneration, lumbar region: Secondary | ICD-10-CM | POA: Diagnosis not present

## 2018-04-16 DIAGNOSIS — M791 Myalgia, unspecified site: Secondary | ICD-10-CM | POA: Diagnosis not present

## 2018-04-16 DIAGNOSIS — M5033 Other cervical disc degeneration, cervicothoracic region: Secondary | ICD-10-CM | POA: Diagnosis not present

## 2018-04-17 DIAGNOSIS — R1013 Epigastric pain: Secondary | ICD-10-CM | POA: Diagnosis not present

## 2018-04-17 DIAGNOSIS — D696 Thrombocytopenia, unspecified: Secondary | ICD-10-CM | POA: Diagnosis not present

## 2018-04-17 DIAGNOSIS — E871 Hypo-osmolality and hyponatremia: Secondary | ICD-10-CM | POA: Diagnosis not present

## 2018-04-17 DIAGNOSIS — D72829 Elevated white blood cell count, unspecified: Secondary | ICD-10-CM | POA: Insufficient documentation

## 2018-04-17 DIAGNOSIS — E876 Hypokalemia: Secondary | ICD-10-CM | POA: Diagnosis not present

## 2018-04-17 DIAGNOSIS — D75839 Thrombocytosis, unspecified: Secondary | ICD-10-CM | POA: Insufficient documentation

## 2018-05-15 DIAGNOSIS — Z1231 Encounter for screening mammogram for malignant neoplasm of breast: Secondary | ICD-10-CM | POA: Diagnosis not present

## 2018-05-15 DIAGNOSIS — Z0000 Encounter for general adult medical examination without abnormal findings: Secondary | ICD-10-CM | POA: Diagnosis not present

## 2018-05-15 DIAGNOSIS — Z6837 Body mass index (BMI) 37.0-37.9, adult: Secondary | ICD-10-CM | POA: Diagnosis not present

## 2018-05-19 DIAGNOSIS — M5136 Other intervertebral disc degeneration, lumbar region: Secondary | ICD-10-CM | POA: Diagnosis not present

## 2018-05-19 DIAGNOSIS — M791 Myalgia, unspecified site: Secondary | ICD-10-CM | POA: Diagnosis not present

## 2018-05-19 DIAGNOSIS — G894 Chronic pain syndrome: Secondary | ICD-10-CM | POA: Diagnosis not present

## 2018-05-28 ENCOUNTER — Ambulatory Visit: Payer: PPO | Admitting: Sports Medicine

## 2018-05-28 ENCOUNTER — Encounter: Payer: Self-pay | Admitting: Sports Medicine

## 2018-05-28 ENCOUNTER — Other Ambulatory Visit: Payer: Self-pay

## 2018-05-28 VITALS — BP 114/79 | HR 75 | Temp 96.8°F | Resp 16

## 2018-05-28 DIAGNOSIS — B351 Tinea unguium: Secondary | ICD-10-CM

## 2018-05-28 DIAGNOSIS — M79675 Pain in left toe(s): Secondary | ICD-10-CM | POA: Diagnosis not present

## 2018-05-28 DIAGNOSIS — E1142 Type 2 diabetes mellitus with diabetic polyneuropathy: Secondary | ICD-10-CM | POA: Diagnosis not present

## 2018-05-28 DIAGNOSIS — M779 Enthesopathy, unspecified: Secondary | ICD-10-CM | POA: Diagnosis not present

## 2018-05-28 DIAGNOSIS — M21619 Bunion of unspecified foot: Secondary | ICD-10-CM

## 2018-05-28 DIAGNOSIS — M2142 Flat foot [pes planus] (acquired), left foot: Secondary | ICD-10-CM

## 2018-05-28 DIAGNOSIS — M79672 Pain in left foot: Secondary | ICD-10-CM

## 2018-05-28 DIAGNOSIS — M7751 Other enthesopathy of right foot: Secondary | ICD-10-CM

## 2018-05-28 DIAGNOSIS — M2040 Other hammer toe(s) (acquired), unspecified foot: Secondary | ICD-10-CM

## 2018-05-28 DIAGNOSIS — M2141 Flat foot [pes planus] (acquired), right foot: Secondary | ICD-10-CM

## 2018-05-28 DIAGNOSIS — M67472 Ganglion, left ankle and foot: Secondary | ICD-10-CM

## 2018-05-28 DIAGNOSIS — M79674 Pain in right toe(s): Secondary | ICD-10-CM | POA: Diagnosis not present

## 2018-05-28 MED ORDER — TRIAMCINOLONE ACETONIDE 10 MG/ML IJ SUSP
10.0000 mg | Freq: Once | INTRAMUSCULAR | Status: AC
  Administered 2018-05-28: 10 mg

## 2018-05-28 NOTE — Progress Notes (Signed)
Subjective: Andrea White is a 63 y.o. female patient who returns to office for evaluation of left greater than right foot pain and for diabetic nail care. Patient complains of progressive pain especially over the last few weeks reports that she had pain at the inside of her left heel that runs up to her ankle sharp 5 out of 10 pain has tried Voltaren gel without relief on her left states that a few weeks ago when the pain first appeared she could barely walk or put pressure on the foot due to the pain her husband advised her to elevate which seems to help with the swelling.  Patient reports that she still has a little pain on her right but not as bad as her left and patient also desires for her nails to be trimmed at this visit.  Patient does not want x-rays this visit.  Denies injury/trip/fall/sprain/any causative factors.   Patient is diabetic her last blood sugar this morning was not recorded but yesterday was 129 and last A1c 6.9  Patient Active Problem List   Diagnosis Date Noted  . Myocardial infarction ([place]) 10/31/2017  . Primary fibromyalgia syndrome 10/31/2017  . Gastroesophageal reflux disease 03/15/2015  . Asthma 03/15/2015  . Obesity 03/15/2015  . Herniated disc 06/10/2011  . History of heart attack 06/10/2011  . Cyst of ovary     Current Outpatient Medications on File Prior to Visit  Medication Sig Dispense Refill  . albuterol (PROVENTIL HFA;VENTOLIN HFA) 108 (90 Base) MCG/ACT inhaler Inhale into the lungs every 6 (six) hours as needed for wheezing or shortness of breath.    . ALPRAZolam (XANAX) 0.5 MG tablet Take 0.5 mg by mouth 3 (three) times daily as needed. For anxiety    . Biotin 10000 MCG TABS Take 1 tablet by mouth daily.    . diclofenac sodium (VOLTAREN) 1 % GEL     . diltiazem (CARDIZEM SR) 90 MG 12 hr capsule Take 90 mg by mouth 2 (two) times daily.     . DULoxetine (CYMBALTA) 30 MG capsule     . DULoxetine (CYMBALTA) 60 MG capsule     . ibuprofen (ADVIL,MOTRIN)  200 MG tablet Take 200 mg by mouth every 6 (six) hours as needed.    . loratadine (CLARITIN) 10 MG tablet Take 10 mg by mouth daily.    Marland Kitchen losartan (COZAAR) 50 MG tablet Take 50 mg by mouth daily.    . meclizine (ANTIVERT) 25 MG tablet     . metFORMIN (GLUCOPHAGE) 500 MG tablet Take 500 mg by mouth 2 (two) times daily with a meal.    . omeprazole (PRILOSEC) 40 MG capsule Take 40 mg by mouth 2 (two) times daily.    . ONE TOUCH ULTRA TEST test strip     . ONETOUCH DELICA LANCETS 81X MISC     . polyethylene glycol (MIRALAX / GLYCOLAX) packet Take 17 g by mouth 2 (two) times daily.     Marland Kitchen POTASSIUM GLUCONATE PO Take 2 tablets by mouth daily.     . pravastatin (PRAVACHOL) 20 MG tablet Take 20 mg by mouth daily.    . Probiotic Product (PROBIOTIC DAILY PO) Take 1 tablet by mouth daily.    Marland Kitchen spironolactone-hydrochlorothiazide (ALDACTAZIDE) 25-25 MG per tablet Take 1 tablet by mouth daily.    . traMADol (ULTRAM) 50 MG tablet Take by mouth every 6 (six) hours as needed.     No current facility-administered medications on file prior to visit.  Allergies  Allergen Reactions  . Morphine And Related Other (See Comments)    Woozy, "out of body experience"  . Celebrex [Celecoxib] Other (See Comments)    Patient does not remember intolerance  . Lisinopril Cough  . Guaifenesin & Derivatives Other (See Comments)    Patient cannot remember specific issue, just made her feel "weird"  . Sulfa Antibiotics Rash  . Valium [Diazepam] Other (See Comments)    Lightheaded, dizziness     Objective:  General: Alert and oriented x3 in no acute distress  Dermatology: Nails x10 elongated thickened dystrophic consistent with onychomycosis.    There is a raised soft tissue mass measures less than 1.5 cm x2 at the dorsal midfoot along the extensor tendon that corresponds with the fourth toe consistent with cyst appears to be unchanged from prior and not painful as long as a shoe does not rub the area, no webspace  macerations, no ecchymosis bilateral, all nails x 10 are well manicured.  Vascular: Dorsalis Pedis and Posterior Tibial pedal pulses palpable, Capillary Fill Time 5 seconds,(+) pedal hair growth bilateral, no edema bilateral lower extremities, Temperature gradient within normal limits.  Neurology: Johney Maine sensation intact via light touch bilateral, Protective sensation intact bilateral with Semmes Weinstein monofilament however vibratory sensation diminished bilateral.  Musculoskeletal: Moderate tenderness to palpation over the posterior tibial tendon course and at the medial plantar fascial insertion on the left.  Mild tenderness with palpation at peroneal tendon course right greater than left, pes planus foot type, bunion and hammertoe deformity that is currently asymptomatic.  Gait: Antalgic gait   Assessment and Plan: Problem List Items Addressed This Visit    None    Visit Diagnoses    Pain due to onychomycosis of toenails of both feet    -  Primary   Tendinitis       Left posterior tibial tendon   Relevant Medications   triamcinolone acetonide (KENALOG) 10 MG/ML injection 10 mg (Start on 05/28/2018 12:45 PM)   Intractable left heel pain       Ganglion cyst of left foot       Tendonitis of ankle, right       Diabetic polyneuropathy associated with type 2 diabetes mellitus (HCC)       Relevant Medications   DULoxetine (CYMBALTA) 60 MG capsule   Pes planus of both feet       Bunion       Hammer toe, unspecified laterality          -Complete examination performed -Patient declined x-rays this visit -Discussed treatement options tendinitis, cyst, arthritis, capsulitis, and painful toenails -After oral consent and aseptic prep, injected a mixture containing 1 ml of 2%  plain lidocaine, 1 ml 0.5% plain marcaine, 0.5 ml of kenalog 10 and 0.5 ml of dexamethasone phosphate into left medial heel at plantar fascial insertion only about 75% of the medication was administered could not  administer more due to patient having pain along the tendon as I attempted to redirect the needle to give her some medicine along the posterior tibial tendon course.  Post-injection care discussed with patient.  Recommend rest ice elevation and good supportive shoes and may continue with topical pain cream or rub as needed -Advised patient like before to closely monitor ganglion cyst since patient refused aspiration or steroid injection at the site this visit I advised patient if cyst becomes larger than 1-1/2 cm to return to office for further evaluation likely will require ultrasound and excision if worsens -  Mechanically debrided nails x10 using a sterile nipper without incident -Patient to be scheduled with [facility] for diabetic shoes previous order form was done at last visit for [facility] -Patient to return to office for diabetic shoe measurements or sooner if condition worsens.  Landis Martins, DPM

## 2018-06-11 ENCOUNTER — Other Ambulatory Visit: Payer: Self-pay

## 2018-06-11 ENCOUNTER — Ambulatory Visit: Payer: PPO | Admitting: Orthotics

## 2018-06-11 DIAGNOSIS — E1142 Type 2 diabetes mellitus with diabetic polyneuropathy: Secondary | ICD-10-CM

## 2018-06-11 DIAGNOSIS — M2141 Flat foot [pes planus] (acquired), right foot: Secondary | ICD-10-CM

## 2018-06-11 DIAGNOSIS — M2040 Other hammer toe(s) (acquired), unspecified foot: Secondary | ICD-10-CM

## 2018-06-11 DIAGNOSIS — M79674 Pain in right toe(s): Secondary | ICD-10-CM

## 2018-06-11 DIAGNOSIS — B351 Tinea unguium: Secondary | ICD-10-CM

## 2018-06-11 NOTE — Progress Notes (Signed)

## 2018-06-25 DIAGNOSIS — E1121 Type 2 diabetes mellitus with diabetic nephropathy: Secondary | ICD-10-CM | POA: Diagnosis not present

## 2018-06-25 DIAGNOSIS — E1142 Type 2 diabetes mellitus with diabetic polyneuropathy: Secondary | ICD-10-CM | POA: Diagnosis not present

## 2018-06-25 DIAGNOSIS — I10 Essential (primary) hypertension: Secondary | ICD-10-CM | POA: Diagnosis not present

## 2018-06-25 DIAGNOSIS — I9589 Other hypotension: Secondary | ICD-10-CM | POA: Diagnosis not present

## 2018-06-25 DIAGNOSIS — E782 Mixed hyperlipidemia: Secondary | ICD-10-CM | POA: Diagnosis not present

## 2018-06-25 DIAGNOSIS — F411 Generalized anxiety disorder: Secondary | ICD-10-CM | POA: Diagnosis not present

## 2018-06-25 DIAGNOSIS — Z1159 Encounter for screening for other viral diseases: Secondary | ICD-10-CM | POA: Diagnosis not present

## 2018-06-25 DIAGNOSIS — M797 Fibromyalgia: Secondary | ICD-10-CM | POA: Diagnosis not present

## 2018-06-25 DIAGNOSIS — R42 Dizziness and giddiness: Secondary | ICD-10-CM | POA: Diagnosis not present

## 2018-07-24 DIAGNOSIS — I9589 Other hypotension: Secondary | ICD-10-CM | POA: Diagnosis not present

## 2018-07-24 DIAGNOSIS — J208 Acute bronchitis due to other specified organisms: Secondary | ICD-10-CM | POA: Diagnosis not present

## 2018-07-24 DIAGNOSIS — R001 Bradycardia, unspecified: Secondary | ICD-10-CM | POA: Diagnosis not present

## 2018-07-30 ENCOUNTER — Telehealth: Payer: Self-pay

## 2018-07-30 NOTE — Telephone Encounter (Signed)
Patient states she was hypotensive due to BP meds Dr. Tobie Poet had her on. She has notified Dr. Tobie Poet and does not want to see cardiologist at this time.

## 2018-08-05 ENCOUNTER — Ambulatory Visit: Payer: PPO | Admitting: Cardiology

## 2018-08-13 ENCOUNTER — Encounter: Payer: Self-pay | Admitting: Sports Medicine

## 2018-08-13 ENCOUNTER — Ambulatory Visit (INDEPENDENT_AMBULATORY_CARE_PROVIDER_SITE_OTHER): Payer: PPO | Admitting: Sports Medicine

## 2018-08-13 ENCOUNTER — Other Ambulatory Visit: Payer: Self-pay

## 2018-08-13 DIAGNOSIS — M2042 Other hammer toe(s) (acquired), left foot: Secondary | ICD-10-CM | POA: Diagnosis not present

## 2018-08-13 DIAGNOSIS — M2141 Flat foot [pes planus] (acquired), right foot: Secondary | ICD-10-CM

## 2018-08-13 DIAGNOSIS — M779 Enthesopathy, unspecified: Secondary | ICD-10-CM | POA: Diagnosis not present

## 2018-08-13 DIAGNOSIS — M2041 Other hammer toe(s) (acquired), right foot: Secondary | ICD-10-CM

## 2018-08-13 DIAGNOSIS — E1140 Type 2 diabetes mellitus with diabetic neuropathy, unspecified: Secondary | ICD-10-CM

## 2018-08-13 DIAGNOSIS — M2142 Flat foot [pes planus] (acquired), left foot: Secondary | ICD-10-CM

## 2018-08-13 DIAGNOSIS — M67472 Ganglion, left ankle and foot: Secondary | ICD-10-CM

## 2018-08-13 DIAGNOSIS — E1142 Type 2 diabetes mellitus with diabetic polyneuropathy: Secondary | ICD-10-CM

## 2018-08-13 NOTE — Progress Notes (Signed)
Subjective: Andrea White is a 63 y.o. female patient who returns to office for evaluation of left ankle pain and for follow-up evaluation of cyst.  Patient reports that she does not feel the cyst as much anymore and it does not bother her the most pain that she has is still in her arches and at the medial aspect of her left ankle get sharp pains from time to time that lasts brief moments that sometimes at its worst is 10 out of 10 admits that there is some swelling denies redness warmth and reports that an Ace wrap compression gives her compression and helps to keep the swelling down as well as elevating.  Patient is also here for pickup of her diabetic shoes.   Patient is diabetic her last blood sugar this morning was not recorded.  Patient Active Problem List   Diagnosis Date Noted  . Myocardial infarction ([place]) 10/31/2017  . Primary fibromyalgia syndrome 10/31/2017  . Gastroesophageal reflux disease 03/15/2015  . Asthma 03/15/2015  . Obesity 03/15/2015  . Herniated disc 06/10/2011  . History of heart attack 06/10/2011  . Cyst of ovary     Current Outpatient Medications on File Prior to Visit  Medication Sig Dispense Refill  . albuterol (PROVENTIL HFA;VENTOLIN HFA) 108 (90 Base) MCG/ACT inhaler Inhale into the lungs every 6 (six) hours as needed for wheezing or shortness of breath.    . ALPRAZolam (XANAX) 0.5 MG tablet Take 0.5 mg by mouth 3 (three) times daily as needed. For anxiety    . Biotin 10000 MCG TABS Take 1 tablet by mouth daily.    . diclofenac sodium (VOLTAREN) 1 % GEL     . diltiazem (CARDIZEM SR) 90 MG 12 hr capsule Take 90 mg by mouth 2 (two) times daily.     . DULoxetine (CYMBALTA) 30 MG capsule     . DULoxetine (CYMBALTA) 60 MG capsule     . ibuprofen (ADVIL,MOTRIN) 200 MG tablet Take 200 mg by mouth every 6 (six) hours as needed.    . loratadine (CLARITIN) 10 MG tablet Take 10 mg by mouth daily.    Marland Kitchen losartan (COZAAR) 50 MG tablet Take 50 mg by mouth daily.    .  meclizine (ANTIVERT) 25 MG tablet     . metFORMIN (GLUCOPHAGE) 500 MG tablet Take 500 mg by mouth 2 (two) times daily with a meal.    . omeprazole (PRILOSEC) 40 MG capsule Take 40 mg by mouth 2 (two) times daily.    . ONE TOUCH ULTRA TEST test strip     . ONETOUCH DELICA LANCETS 16X MISC     . polyethylene glycol (MIRALAX / GLYCOLAX) packet Take 17 g by mouth 2 (two) times daily.     Marland Kitchen POTASSIUM GLUCONATE PO Take 2 tablets by mouth daily.     . pravastatin (PRAVACHOL) 20 MG tablet Take 20 mg by mouth daily.    . Probiotic Product (PROBIOTIC DAILY PO) Take 1 tablet by mouth daily.    Marland Kitchen spironolactone-hydrochlorothiazide (ALDACTAZIDE) 25-25 MG per tablet Take 1 tablet by mouth daily.    . traMADol (ULTRAM) 50 MG tablet Take by mouth every 6 (six) hours as needed.     No current facility-administered medications on file prior to visit.     Allergies  Allergen Reactions  . Morphine And Related Other (See Comments)    Woozy, "out of body experience"  . Celebrex [Celecoxib] Other (See Comments)    Patient does not remember intolerance  .  Lisinopril Cough  . Guaifenesin & Derivatives Other (See Comments)    Patient cannot remember specific issue, just made her feel "weird"  . Sulfa Antibiotics Rash  . Valium [Diazepam] Other (See Comments)    Lightheaded, dizziness     Objective:  General: Alert and oriented x3 in no acute distress  Dermatology: Nails x10 are are short thickened dystrophic consistent with onychomycosis.    T there is a decrease in size at the dorsal lateral midfoot along the extensor tendon on the left foot and not painful as long as a shoe does not rub the area, no webspace macerations, no ecchymosis bilateral, all nails x 10 are well manicured.  Vascular: Dorsalis Pedis and Posterior Tibial pedal pulses palpable, Capillary Fill Time 5 seconds,(+) pedal hair growth bilateral, no edema bilateral lower extremities, Temperature gradient within normal limits.  Neurology:  Johney Maine sensation intact via light touch bilateral, Protective sensation intact bilateral with Semmes Weinstein monofilament however vibratory sensation diminished bilateral.  Musculoskeletal: Moderate tenderness to palpation over the posterior tibial tendon course and at the medial plantar fascial insertion on the left.  Mild tenderness with palpation at peroneal tendon course right greater than left, pes planus foot type, bunion and hammertoe deformity that is currently asymptomatic.   Assessment and Plan: Problem List Items Addressed This Visit    None    Visit Diagnoses    Tendinitis    -  Primary   Diabetic polyneuropathy associated with type 2 diabetes mellitus ([place])       Ganglion cyst of left foot          -Complete examination performed -Re-Discussed treatement options tendinitis, cyst, arthritis, capsulitis, and painful toenails -Dispensed diabetic shoes to patient patient was able to ambulate 10 feet without pain or discomfort however does admit that there is some rubbing or irritation at her medial ankle likely from her severe pes planus deformity thus I dispensed a Tri-Lock brace for patient to use with her shoes to see if this will give her foot some more stability and control and to help to slow down the progression of her significant pes planus deformity and worsening of any tendinitis to the area -Advised patient like before to closely monitor ganglion cyst and again advised patient if cyst becomes larger than 1-1/2 cm to return to office for further evaluation likely will require ultrasound and excision if worsens -Patient to return to office for diabetic foot care or sooner if condition worsens.  Landis Martins, DPM

## 2018-08-13 NOTE — Patient Instructions (Signed)

## 2018-08-18 DIAGNOSIS — M791 Myalgia, unspecified site: Secondary | ICD-10-CM | POA: Diagnosis not present

## 2018-08-18 DIAGNOSIS — G894 Chronic pain syndrome: Secondary | ICD-10-CM | POA: Diagnosis not present

## 2018-08-18 DIAGNOSIS — M5136 Other intervertebral disc degeneration, lumbar region: Secondary | ICD-10-CM | POA: Diagnosis not present

## 2018-08-18 DIAGNOSIS — M7918 Myalgia, other site: Secondary | ICD-10-CM | POA: Diagnosis not present

## 2018-09-10 ENCOUNTER — Encounter: Payer: Self-pay | Admitting: Sports Medicine

## 2018-09-10 ENCOUNTER — Other Ambulatory Visit: Payer: Self-pay

## 2018-09-10 ENCOUNTER — Ambulatory Visit (INDEPENDENT_AMBULATORY_CARE_PROVIDER_SITE_OTHER): Payer: PPO | Admitting: Sports Medicine

## 2018-09-10 VITALS — Temp 97.0°F | Resp 16

## 2018-09-10 DIAGNOSIS — M79675 Pain in left toe(s): Secondary | ICD-10-CM

## 2018-09-10 DIAGNOSIS — M79674 Pain in right toe(s): Secondary | ICD-10-CM | POA: Diagnosis not present

## 2018-09-10 DIAGNOSIS — B351 Tinea unguium: Secondary | ICD-10-CM | POA: Diagnosis not present

## 2018-09-10 DIAGNOSIS — E1142 Type 2 diabetes mellitus with diabetic polyneuropathy: Secondary | ICD-10-CM | POA: Diagnosis not present

## 2018-09-10 NOTE — Progress Notes (Signed)
Subjective: Andrea White is a 63 y.o. female patient who returns to office for evaluation brace and shoe check and for diabetic nail trim.   Reports that she is doing good no pain or issues.  Patient is diabetic her last blood sugar this morning was not recorded but 1 week ago was 121 and last A1c 6.5 and saw Dr. Tobie Poet PCP x Feb.   Patient Active Problem List   Diagnosis Date Noted  . Myocardial infarction ([place]) 10/31/2017  . Primary fibromyalgia syndrome 10/31/2017  . Gastroesophageal reflux disease 03/15/2015  . Asthma 03/15/2015  . Obesity 03/15/2015  . Herniated disc 06/10/2011  . History of heart attack 06/10/2011  . Cyst of ovary     Current Outpatient Medications on File Prior to Visit  Medication Sig Dispense Refill  . albuterol (PROVENTIL HFA;VENTOLIN HFA) 108 (90 Base) MCG/ACT inhaler Inhale into the lungs every 6 (six) hours as needed for wheezing or shortness of breath.    . ALPRAZolam (XANAX) 0.5 MG tablet Take 0.5 mg by mouth 3 (three) times daily as needed. For anxiety    . Biotin 10000 MCG TABS Take 1 tablet by mouth daily.    . diclofenac sodium (VOLTAREN) 1 % GEL     . diltiazem (CARDIZEM SR) 90 MG 12 hr capsule Take 90 mg by mouth 2 (two) times daily.     . DULoxetine (CYMBALTA) 30 MG capsule     . DULoxetine (CYMBALTA) 60 MG capsule     . ibuprofen (ADVIL,MOTRIN) 200 MG tablet Take 200 mg by mouth every 6 (six) hours as needed.    . loratadine (CLARITIN) 10 MG tablet Take 10 mg by mouth daily.    Marland Kitchen losartan (COZAAR) 50 MG tablet Take 50 mg by mouth daily.    . meclizine (ANTIVERT) 25 MG tablet     . metFORMIN (GLUCOPHAGE) 500 MG tablet Take 500 mg by mouth 2 (two) times daily with a meal.    . omeprazole (PRILOSEC) 40 MG capsule Take 40 mg by mouth 2 (two) times daily.    . ONE TOUCH ULTRA TEST test strip     . ONETOUCH DELICA LANCETS 60V MISC     . polyethylene glycol (MIRALAX / GLYCOLAX) packet Take 17 g by mouth 2 (two) times daily.     Marland Kitchen POTASSIUM  GLUCONATE PO Take 2 tablets by mouth daily.     . pravastatin (PRAVACHOL) 20 MG tablet Take 20 mg by mouth daily.    . Probiotic Product (PROBIOTIC DAILY PO) Take 1 tablet by mouth daily.    Marland Kitchen spironolactone-hydrochlorothiazide (ALDACTAZIDE) 25-25 MG per tablet Take 1 tablet by mouth daily.    . traMADol (ULTRAM) 50 MG tablet Take by mouth every 6 (six) hours as needed.     No current facility-administered medications on file prior to visit.     Allergies  Allergen Reactions  . Morphine And Related Other (See Comments)    Woozy, "out of body experience"  . Celebrex [Celecoxib] Other (See Comments)    Patient does not remember intolerance  . Lisinopril Cough  . Guaifenesin & Derivatives Other (See Comments)    Patient cannot remember specific issue, just made her feel "weird"  . Sulfa Antibiotics Rash  . Valium [Diazepam] Other (See Comments)    Lightheaded, dizziness     Objective:  General: Alert and oriented x3 in no acute distress  Dermatology: Nails x10 elongated thickened dystrophic consistent with onychomycosis.    Vascular: Dorsalis Pedis and Posterior  Tibial pedal pulses palpable, Capillary Fill Time 5 seconds,(+) pedal hair growth bilateral, no edema bilateral lower extremities, Temperature gradient within normal limits.  Neurology: Johney Maine sensation intact via light touch bilateral, Protective sensation intact bilateral with Semmes Weinstein monofilament however vibratory sensation diminished bilateral.  Musculoskeletal: + pes planus foot type, bunion and hammertoe deformity that is currently asymptomatic.  Gait: shoe and brace assisted  Assessment and Plan: Problem List Items Addressed This Visit    None    Visit Diagnoses    Pain due to onychomycosis of toenails of both feet    -  Primary   Diabetic polyneuropathy associated with type 2 diabetes mellitus (HCC)          -Complete examination performed -Mechanically debrided nails x10 using a sterile nipper  without incident -Continue with diabetic shoes and trilock brace -Patient to return to office for in 3 months or sooner if condition worsens.  Landis Martins, DPM

## 2018-09-18 DIAGNOSIS — H524 Presbyopia: Secondary | ICD-10-CM | POA: Diagnosis not present

## 2018-09-18 DIAGNOSIS — H5203 Hypermetropia, bilateral: Secondary | ICD-10-CM | POA: Diagnosis not present

## 2018-09-18 DIAGNOSIS — H2513 Age-related nuclear cataract, bilateral: Secondary | ICD-10-CM | POA: Diagnosis not present

## 2018-09-18 DIAGNOSIS — E119 Type 2 diabetes mellitus without complications: Secondary | ICD-10-CM | POA: Diagnosis not present

## 2018-09-25 DIAGNOSIS — Z23 Encounter for immunization: Secondary | ICD-10-CM | POA: Diagnosis not present

## 2018-09-25 DIAGNOSIS — E1142 Type 2 diabetes mellitus with diabetic polyneuropathy: Secondary | ICD-10-CM | POA: Diagnosis not present

## 2018-09-25 DIAGNOSIS — I10 Essential (primary) hypertension: Secondary | ICD-10-CM | POA: Diagnosis not present

## 2018-09-25 DIAGNOSIS — F411 Generalized anxiety disorder: Secondary | ICD-10-CM | POA: Diagnosis not present

## 2018-09-25 DIAGNOSIS — E1121 Type 2 diabetes mellitus with diabetic nephropathy: Secondary | ICD-10-CM | POA: Diagnosis not present

## 2018-09-25 DIAGNOSIS — M797 Fibromyalgia: Secondary | ICD-10-CM | POA: Diagnosis not present

## 2018-09-25 DIAGNOSIS — E782 Mixed hyperlipidemia: Secondary | ICD-10-CM | POA: Diagnosis not present

## 2018-12-04 ENCOUNTER — Other Ambulatory Visit: Payer: Self-pay | Admitting: *Deleted

## 2018-12-10 ENCOUNTER — Ambulatory Visit (INDEPENDENT_AMBULATORY_CARE_PROVIDER_SITE_OTHER): Payer: PPO | Admitting: Sports Medicine

## 2018-12-10 ENCOUNTER — Encounter: Payer: Self-pay | Admitting: Sports Medicine

## 2018-12-10 ENCOUNTER — Other Ambulatory Visit: Payer: Self-pay

## 2018-12-10 DIAGNOSIS — B351 Tinea unguium: Secondary | ICD-10-CM | POA: Diagnosis not present

## 2018-12-10 DIAGNOSIS — M79675 Pain in left toe(s): Secondary | ICD-10-CM

## 2018-12-10 DIAGNOSIS — I739 Peripheral vascular disease, unspecified: Secondary | ICD-10-CM

## 2018-12-10 DIAGNOSIS — M79674 Pain in right toe(s): Secondary | ICD-10-CM | POA: Diagnosis not present

## 2018-12-10 DIAGNOSIS — E1142 Type 2 diabetes mellitus with diabetic polyneuropathy: Secondary | ICD-10-CM | POA: Diagnosis not present

## 2018-12-10 NOTE — Progress Notes (Signed)
Subjective: MUNTAHA VERMETTE is a 63 y.o. female patient who returns to office for diabetic nail care.  Patient reports that she is having issues with a lot of swelling and pain in both feet and ankles.  Patient reports that the swelling is slowly getting worse and is concerning because it makes her feet hurt so bad that she can barely walk.  Patient is diabetic her last blood sugar this morning was not recorded but 1 month ago was 134 and last A1c 6.5 and saw Dr. Tobie Poet PCP x couple of months ago.  Patient Active Problem List   Diagnosis Date Noted  . Myocardial infarction ([place]) 10/31/2017  . Primary fibromyalgia syndrome 10/31/2017  . Gastroesophageal reflux disease 03/15/2015  . Asthma 03/15/2015  . Obesity 03/15/2015  . Herniated disc 06/10/2011  . History of heart attack 06/10/2011  . Cyst of ovary     Current Outpatient Medications on File Prior to Visit  Medication Sig Dispense Refill  . albuterol (PROVENTIL HFA;VENTOLIN HFA) 108 (90 Base) MCG/ACT inhaler Inhale into the lungs every 6 (six) hours as needed for wheezing or shortness of breath.    . ALPRAZolam (XANAX) 0.5 MG tablet Take 0.5 mg by mouth 3 (three) times daily as needed. For anxiety    . Biotin 10000 MCG TABS Take 1 tablet by mouth daily.    . diclofenac sodium (VOLTAREN) 1 % GEL     . diltiazem (CARDIZEM SR) 90 MG 12 hr capsule Take 90 mg by mouth 2 (two) times daily.     . DULoxetine (CYMBALTA) 30 MG capsule     . DULoxetine (CYMBALTA) 60 MG capsule     . ibuprofen (ADVIL,MOTRIN) 200 MG tablet Take 200 mg by mouth every 6 (six) hours as needed.    . loratadine (CLARITIN) 10 MG tablet Take 10 mg by mouth daily.    . meclizine (ANTIVERT) 25 MG tablet     . metFORMIN (GLUCOPHAGE) 500 MG tablet Take 500 mg by mouth 2 (two) times daily with a meal.    . omeprazole (PRILOSEC) 40 MG capsule Take 40 mg by mouth 2 (two) times daily.    . ONE TOUCH ULTRA TEST test strip     . ONETOUCH DELICA LANCETS 67T MISC     . polyethylene  glycol (MIRALAX / GLYCOLAX) packet Take 17 g by mouth 2 (two) times daily.     Marland Kitchen POTASSIUM GLUCONATE PO Take 2 tablets by mouth daily.     . pravastatin (PRAVACHOL) 20 MG tablet Take 20 mg by mouth daily.    . Probiotic Product (PROBIOTIC DAILY PO) Take 1 tablet by mouth daily.    Marland Kitchen spironolactone-hydrochlorothiazide (ALDACTAZIDE) 25-25 MG per tablet Take 1 tablet by mouth daily.    . traMADol (ULTRAM) 50 MG tablet Take by mouth every 6 (six) hours as needed.     No current facility-administered medications on file prior to visit.     Allergies  Allergen Reactions  . Morphine And Related Other (See Comments)    Woozy, "out of body experience"  . Celebrex [Celecoxib] Other (See Comments)    Patient does not remember intolerance  . Lisinopril Cough  . Guaifenesin & Derivatives Other (See Comments)    Patient cannot remember specific issue, just made her feel "weird"  . Sulfa Antibiotics Rash  . Valium [Diazepam] Other (See Comments)    Lightheaded, dizziness     Objective:  General: Alert and oriented x3 in no acute distress  Dermatology: Nails x10  elongated thickened dystrophic consistent with onychomycosis.    Vascular: Dorsalis Pedis and Posterior Tibial pedal pulses palpable faintly after edema is displaced, Capillary Fill Time 5 seconds,(+) pedal hair growth bilateral, 1+ pitting edema bilateral lower extremities, Temperature gradient within normal limits.  Neurology: Johney Maine sensation intact via light touch bilateral, Protective sensation intact bilateral with Semmes Weinstein monofilament however vibratory sensation diminished bilateral.  Musculoskeletal: + pes planus foot type, bunion and hammertoe deformity that is currently asymptomatic.  Mild pain to palpation medial ankles bilateral and diffuse foot secondary to swelling.  Assessment and Plan: Problem List Items Addressed This Visit    None    Visit Diagnoses    Pain due to onychomycosis of toenails of both feet    -   Primary   Diabetic polyneuropathy associated with type 2 diabetes mellitus (HCC)       PVD (peripheral vascular disease) ([place])          -Complete examination performed -Mechanically debrided nails x10 using a sterile nipper without incident -Continue with diabetic shoes and trilock brace to help with ankle pain as well as advised patient to follow-up with PCP regarding edema for the possibility of adding on fluid medication -Encourage rest elevation and avoid adding salt to food for edema control -Patient to return to office for in 3 months or sooner if condition worsens.  Landis Martins, DPM

## 2018-12-16 DIAGNOSIS — Z1159 Encounter for screening for other viral diseases: Secondary | ICD-10-CM | POA: Diagnosis not present

## 2018-12-16 DIAGNOSIS — L03211 Cellulitis of face: Secondary | ICD-10-CM | POA: Diagnosis not present

## 2018-12-16 DIAGNOSIS — L0201 Cutaneous abscess of face: Secondary | ICD-10-CM | POA: Diagnosis not present

## 2018-12-16 DIAGNOSIS — R600 Localized edema: Secondary | ICD-10-CM | POA: Diagnosis not present

## 2018-12-29 DIAGNOSIS — I10 Essential (primary) hypertension: Secondary | ICD-10-CM | POA: Diagnosis not present

## 2018-12-29 DIAGNOSIS — F411 Generalized anxiety disorder: Secondary | ICD-10-CM | POA: Diagnosis not present

## 2018-12-29 DIAGNOSIS — M797 Fibromyalgia: Secondary | ICD-10-CM | POA: Diagnosis not present

## 2018-12-29 DIAGNOSIS — E1121 Type 2 diabetes mellitus with diabetic nephropathy: Secondary | ICD-10-CM | POA: Diagnosis not present

## 2018-12-29 DIAGNOSIS — E782 Mixed hyperlipidemia: Secondary | ICD-10-CM | POA: Diagnosis not present

## 2018-12-29 DIAGNOSIS — E1142 Type 2 diabetes mellitus with diabetic polyneuropathy: Secondary | ICD-10-CM | POA: Diagnosis not present

## 2018-12-29 DIAGNOSIS — E1169 Type 2 diabetes mellitus with other specified complication: Secondary | ICD-10-CM | POA: Diagnosis not present

## 2019-03-11 ENCOUNTER — Other Ambulatory Visit: Payer: Self-pay | Admitting: Family Medicine

## 2019-03-12 ENCOUNTER — Ambulatory Visit: Payer: PPO | Admitting: Sports Medicine

## 2019-04-19 DIAGNOSIS — M65341 Trigger finger, right ring finger: Secondary | ICD-10-CM | POA: Diagnosis not present

## 2019-04-29 NOTE — Progress Notes (Signed)
Established Patient Office Visit  Subjective:  Patient ID: Andrea White, female    DOB: 23-Jul-1955  Age: 64 y.o. MRN: [id number]  CC:  Chief Complaint  Patient presents with  . Hypertension  . Hyperlipidemia  . Diabetes  . Anxiety  . Fibromyalgia    HPI Pt presents for follow up of hypertension.  She maintains her diet and exercise regimen, and follows up as directed.  Patient has been taking GOLIs (apple vinegar gummies.) No longer on any medicines.    Additionally, she presents with history of type 2 diabetes mellitus without complications.  compliance with treatment has been good; she takes her medication as directed, follows up as directed, and is keeping a glucose diary.  Current meds include an oral hypoglycemic ( Glucophage ).  The hypoglycemic episodes are has had none..  Checks sugars sporadically and has been 120s.  In regard to preventative care, she performs foot self-exams daily and her last ophthalmology exam was in 08/2018.      Pt presents with hyperlipidemia.  Current treatment includes Pravachol.  Compliance with treatment has been good; she takes her medication as directed, maintains her low cholesterol diet, follows up as directed, and maintains her exercise regimen.  She denies experiencing any hypercholesterolemia related symptoms.      In regard to the generalized anxiety disorder, her anxiety disorder was originally diagnosed many years ago.  Current treatment includes a benzodiazepine (xanax) 1/2 in bid and cymbalta.      Fibromyalgia details; the diagnosis was arrived at by the presence of tenderness at specified tender points when pressure to the site is applied.  The symptom complex has been stable and nonprogressive.  Currently on cymbalta.  Helps with fibromyalgia.Patient also has chronic back pain. She sees pain clinic for this and takes tramadol approximately twice a day.  Past Medical History:  Diagnosis Date  . Allergy   . Anxiety   . Arthritis    . Bradycardia, unspecified   . Bruises easily   . Calf pain    when walking  . Fibromyalgia   . Frequent headaches   . GERD (gastroesophageal reflux disease)   . H/O bladder problems   . Hypertension   . IBS (irritable bowel syndrome)   . Localized edema   . Mild persistent asthma, uncomplicated   . Morbid (severe) obesity due to excess calories ([place])   . Muscle pain   . Myocardial infarction ([place])   . Other hypotension   . Ovarian cyst   . Slow transit constipation   . Solitary pulmonary nodule   . Swelling    feet and legs  . Type 2 diabetes mellitus with diabetic nephropathy ([place])   . Varicose veins of bilateral lower extremities with pain     Past Surgical History:  Procedure Laterality Date  . BUNIONECTOMY     left foot 5th toe  . CARDIAC CATHETERIZATION    . carpel tunnel surgery    . CESAREAN SECTION    . CHOLECYSTECTOMY    . GALLBLADDER SURGERY    . TONSILLECTOMY    . TUBAL LIGATION    . VAGINAL HYSTERECTOMY  2003   [facility]/SR    Family History  Problem Relation Age of Onset  . Heart disease Father   . Hypertension Father   . Stroke Father   . Heart attack Father   . Cerebrovascular Accident Father   . Diabetes Sister   . Kidney disease Sister   . Migraines Daughter   .  Cancer Maternal Aunt 74       breast    Social History   Socioeconomic History  . Marital status: Married    Spouse name: Not on file  . Number of children: 1  . Years of education: Not on file  . Highest education level: Not on file  Occupational History  . Occupation: Disabled due to foot problems since 1993  Tobacco Use  . Smoking status: Never Smoker  . Smokeless tobacco: Never Used  Substance and Sexual Activity  . Alcohol use: No  . Drug use: No  . Sexual activity: Yes    Birth control/protection: Surgical    Comment: hyst  Other Topics Concern  . Not on file  Social History Narrative  . Not on file   Social Determinants of Health   Financial Resource Strain:    . Difficulty of Paying Living Expenses:   Food Insecurity:   . Worried About Charity fundraiser in the Last Year:   . Arboriculturist in the Last Year:   Transportation Needs:   . Film/video editor (Medical):   Marland Kitchen Lack of Transportation (Non-Medical):   Physical Activity:   . Days of Exercise per Week:   . Minutes of Exercise per Session:   Stress:   . Feeling of Stress :   Social Connections:   . Frequency of Communication with Friends and Family:   . Frequency of Social Gatherings with Friends and Family:   . Attends Religious Services:   . Active Member of Clubs or Organizations:   . Attends Archivist Meetings:   Marland Kitchen Marital Status:   Intimate Partner Violence:   . Fear of Current or Ex-Partner:   . Emotionally Abused:   Marland Kitchen Physically Abused:   . Sexually Abused:     Outpatient Medications Prior to Visit  Medication Sig Dispense Refill  . albuterol (PROVENTIL HFA;VENTOLIN HFA) 108 (90 Base) MCG/ACT inhaler Inhale into the lungs every 6 (six) hours as needed for wheezing or shortness of breath.    . ALPRAZolam (XANAX) 0.5 MG tablet Take 0.5 mg by mouth 3 (three) times daily as needed. For anxiety    . Biotin 10000 MCG TABS Take 1 tablet by mouth daily.    . diclofenac (VOLTAREN) 75 MG EC tablet Take 75 mg by mouth 2 (two) times daily.    . diclofenac Sodium (VOLTAREN) 1 % GEL     . DULoxetine (CYMBALTA) 60 MG capsule     . furosemide (LASIX) 20 MG tablet Take 20 mg by mouth daily as needed for edema.    Marland Kitchen ibuprofen (ADVIL,MOTRIN) 200 MG tablet Take 200 mg by mouth every 6 (six) hours as needed.    . loratadine (CLARITIN) 10 MG tablet Take 10 mg by mouth daily.    . meclizine (ANTIVERT) 25 MG tablet     . metFORMIN (GLUCOPHAGE) 500 MG tablet Take 500 mg by mouth 2 (two) times daily with a meal.    . omeprazole (PRILOSEC) 40 MG capsule Take 40 mg by mouth 2 (two) times daily.    . ONE TOUCH ULTRA TEST test strip     . ONETOUCH DELICA LANCETS 75Z MISC     .  pravastatin (PRAVACHOL) 20 MG tablet Take 20 mg by mouth daily.    Marland Kitchen spironolactone-hydrochlorothiazide (ALDACTAZIDE) 25-25 MG tablet TAKE 1 TABLET BY MOUTH ONCE DAILY 90 tablet 1  . traMADol (ULTRAM) 50 MG tablet Take by mouth every 6 (six) hours as  needed.     No facility-administered medications prior to visit.    Allergies  Allergen Reactions  . Morphine And Related Other (See Comments)    Woozy, "out of body experience"  . Celebrex [Celecoxib] Other (See Comments)    Patient does not remember intolerance  . Gabapentin Other (See Comments)    Confusion  . Lisinopril Cough  . Guaifenesin & Derivatives Other (See Comments)    Patient cannot remember specific issue, just made her feel "weird"  . Lipitor [Atorvastatin] Rash  . Sulfa Antibiotics Rash  . Valium [Diazepam] Other (See Comments)    Lightheaded, dizziness     ROS Review of Systems  Constitutional: Negative for chills, fatigue and fever.  HENT: Negative for congestion, ear pain, rhinorrhea and sore throat.   Respiratory: Negative for cough and shortness of breath.   Cardiovascular: Negative for chest pain.  Gastrointestinal: Negative for abdominal pain, constipation, diarrhea, nausea and vomiting.  Endocrine: Negative for polydipsia, polyphagia and polyuria.  Genitourinary: Negative for dysuria and urgency.  Musculoskeletal: Positive for arthralgias and back pain. Negative for myalgias.       Voltaren gel and voltaren pills.  Neurological: Negative for dizziness, weakness, light-headedness and headaches.  Psychiatric/Behavioral: Negative for dysphoric mood. The patient is not nervous/anxious.       Objective:    Physical Exam  Constitutional: She appears well-developed and well-nourished.  Cardiovascular: Normal rate, regular rhythm and normal heart sounds.  Pulmonary/Chest: Effort normal and breath sounds normal.  Abdominal: Soft. Bowel sounds are normal.  Musculoskeletal:     Comments: Mildly positive for  SLR BL.  Neurological: She is alert.  Psychiatric: She has a normal mood and affect. Her behavior is normal.    BP 132/84 (BP Location: Left Arm, Patient Position: Sitting)   Pulse 81   Temp (!) 96.2 F (35.7 C) (Temporal)   Ht 5\' 2"  (1.575 m)   Wt 205 lb (93 kg)   SpO2 98%   BMI 37.49 kg/m  Wt Readings from Last 3 Encounters:  04/30/19 205 lb (93 kg)  06/18/11 211 lb (95.7 kg)     Health Maintenance Due  Topic Date Due  . HEMOGLOBIN A1C  Never done  . Hepatitis C Screening  Never done  . FOOT EXAM  Never done  . OPHTHALMOLOGY EXAM  Never done  . URINE MICROALBUMIN  Never done  . HIV Screening  Never done  . PAP SMEAR-Modifier  01/22/2016  . TETANUS/TDAP  01/22/2019      Assessment & Plan:  1. Essential hypertension Well controlled without medicines. Continue to work on eating a healthy diet and exercise.  Labs drawn today.  - Comprehensive metabolic panel - CBC with Differential/Platelet  2. Mixed hyperlipidemia Well controlled.  No changes to medicines.  Continue to work on eating a healthy diet and exercise.  Labs drawn today.  - Lipid panel  3. Mixed diabetic hyperlipidemia associated with type 2 diabetes mellitus (HCC) Control: great Recommend check sugars fasting daily. Recommend check feet daily. Recommend annual eye exams. Medicines: no changes. Continue to work on eating a healthy diet and exercise.  Labs drawn today.   - Hemoglobin A1c - POCT UA - Microalbumin  4. Major depressive disorder, single episode, mild (HCC) The current medical regimen is effective;  continue present plan and medications.  5. Class 2 severe obesity due to excess calories with serious comorbidity and body mass index (BMI) of 37.0 to 37.9 in adult [facility]) Recommend work on diet and exercise.  Follow-up: Return in about 6 months (around 10/30/2019) for fasting. please schedule for next AWV.Marland Kitchen    [name], MD

## 2019-04-30 ENCOUNTER — Other Ambulatory Visit: Payer: Self-pay

## 2019-04-30 ENCOUNTER — Encounter: Payer: Self-pay | Admitting: Family Medicine

## 2019-04-30 ENCOUNTER — Ambulatory Visit (INDEPENDENT_AMBULATORY_CARE_PROVIDER_SITE_OTHER): Payer: PPO | Admitting: Family Medicine

## 2019-04-30 VITALS — BP 132/84 | HR 81 | Temp 96.2°F | Ht 62.0 in | Wt 205.0 lb

## 2019-04-30 DIAGNOSIS — F320 Major depressive disorder, single episode, mild: Secondary | ICD-10-CM | POA: Diagnosis not present

## 2019-04-30 DIAGNOSIS — E1142 Type 2 diabetes mellitus with diabetic polyneuropathy: Secondary | ICD-10-CM | POA: Insufficient documentation

## 2019-04-30 DIAGNOSIS — I10 Essential (primary) hypertension: Secondary | ICD-10-CM | POA: Insufficient documentation

## 2019-04-30 DIAGNOSIS — E782 Mixed hyperlipidemia: Secondary | ICD-10-CM | POA: Insufficient documentation

## 2019-04-30 DIAGNOSIS — E1159 Type 2 diabetes mellitus with other circulatory complications: Secondary | ICD-10-CM | POA: Insufficient documentation

## 2019-04-30 DIAGNOSIS — Z6837 Body mass index (BMI) 37.0-37.9, adult: Secondary | ICD-10-CM

## 2019-04-30 DIAGNOSIS — E1169 Type 2 diabetes mellitus with other specified complication: Secondary | ICD-10-CM | POA: Diagnosis not present

## 2019-04-30 DIAGNOSIS — E119 Type 2 diabetes mellitus without complications: Secondary | ICD-10-CM | POA: Diagnosis not present

## 2019-04-30 LAB — POCT UA - MICROALBUMIN: Microalbumin Ur, POC: 30 mg/L

## 2019-04-30 MED ORDER — SPIRONOLACTONE-HCTZ 25-25 MG PO TABS: 1.0000 | ORAL_TABLET | Freq: Every day | ORAL | 0 refills | Status: DC

## 2019-05-01 LAB — CBC WITH DIFFERENTIAL/PLATELET
Basophils Absolute: 0.1 10*3/uL (ref 0.0–0.2)
Basos: 1 %
EOS (ABSOLUTE): 0.4 10*3/uL (ref 0.0–0.4)
Eos: 4 %
Hematocrit: 39.1 % (ref 34.0–46.6)
Hemoglobin: 13.2 g/dL (ref 11.1–15.9)
Immature Grans (Abs): 0.1 10*3/uL (ref 0.0–0.1)
Immature Granulocytes: 1 %
Lymphocytes Absolute: 3.2 10*3/uL — ABNORMAL HIGH (ref 0.7–3.1)
Lymphs: 32 %
MCH: 30.6 pg (ref 26.6–33.0)
MCHC: 33.8 g/dL (ref 31.5–35.7)
MCV: 91 fL (ref 79–97)
Monocytes Absolute: 1 10*3/uL — ABNORMAL HIGH (ref 0.1–0.9)
Monocytes: 10 %
Neutrophils Absolute: 5.3 10*3/uL (ref 1.4–7.0)
Neutrophils: 52 %
Platelets: 436 10*3/uL (ref 150–450)
RBC: 4.31 x10E6/uL (ref 3.77–5.28)
RDW: 14.3 % (ref 11.7–15.4)
WBC: 10 10*3/uL (ref 3.4–10.8)

## 2019-05-01 LAB — COMPREHENSIVE METABOLIC PANEL
ALT: 13 IU/L (ref 0–32)
AST: 16 IU/L (ref 0–40)
Albumin/Globulin Ratio: 1.6 (ref 1.2–2.2)
Albumin: 3.9 g/dL (ref 3.8–4.8)
Alkaline Phosphatase: 80 IU/L (ref 39–117)
BUN/Creatinine Ratio: 15 (ref 12–28)
BUN: 13 mg/dL (ref 8–27)
Bilirubin Total: 0.5 mg/dL (ref 0.0–1.2)
CO2: 25 mmol/L (ref 20–29)
Calcium: 9.6 mg/dL (ref 8.7–10.3)
Chloride: 103 mmol/L (ref 96–106)
Creatinine, Ser: 0.87 mg/dL (ref 0.57–1.00)
GFR calc Af Amer: 83 mL/min/{1.73_m2} (ref >59)
GFR calc non Af Amer: 72 mL/min/{1.73_m2} (ref >59)
Globulin, Total: 2.4 g/dL (ref 1.5–4.5)
Glucose: 100 mg/dL — ABNORMAL HIGH (ref 65–99)
Potassium: 4 mmol/L (ref 3.5–5.2)
Sodium: 143 mmol/L (ref 134–144)
Total Protein: 6.3 g/dL (ref 6.0–8.5)

## 2019-05-01 LAB — LIPID PANEL
Chol/HDL Ratio: 3.4 ratio (ref 0.0–4.4)
Cholesterol, Total: 140 mg/dL (ref 100–199)
HDL: 41 mg/dL (ref >39)
LDL Chol Calc (NIH): 68 mg/dL (ref 0–99)
Triglycerides: 184 mg/dL — ABNORMAL HIGH (ref 0–149)
VLDL Cholesterol Cal: 31 mg/dL (ref 5–40)

## 2019-05-01 LAB — HEMOGLOBIN A1C
Est. average glucose Bld gHb Est-mCnc: 140 mg/dL
Hgb A1c MFr Bld: 6.5 % — ABNORMAL HIGH (ref 4.8–5.6)

## 2019-05-01 LAB — CARDIOVASCULAR RISK ASSESSMENT

## 2019-05-03 DIAGNOSIS — M5136 Other intervertebral disc degeneration, lumbar region: Secondary | ICD-10-CM | POA: Diagnosis not present

## 2019-05-03 DIAGNOSIS — M7918 Myalgia, other site: Secondary | ICD-10-CM | POA: Diagnosis not present

## 2019-05-03 DIAGNOSIS — G894 Chronic pain syndrome: Secondary | ICD-10-CM | POA: Diagnosis not present

## 2019-05-04 ENCOUNTER — Other Ambulatory Visit: Payer: Self-pay | Admitting: Family Medicine

## 2019-05-05 ENCOUNTER — Telehealth: Payer: Self-pay | Admitting: Family Medicine

## 2019-05-05 NOTE — Progress Notes (Signed)
  Chronic Care Management   Note  05/05/2019 Name: Andrea White MRN: [id number] DOB: Apr 30, 1955  [name] is a 64 y.o. year old female who is a primary care patient of Cox, Kirsten, MD. I reached out to [name] by phone today in response to a referral sent by Ms. Newman Nickels Paddock's PCP, Cox, Kirsten, MD.   [name] was given information about Chronic Care Management services today including:  1. CCM service includes personalized support from designated clinical staff supervised by her physician, including individualized plan of care and coordination with other care providers 2. 24/7 contact phone numbers for assistance for urgent and routine care needs. 3. Service will only be billed when office clinical staff spend 20 minutes or more in a month to coordinate care. 4. Only one practitioner may furnish and bill the service in a calendar month. 5. The patient may stop CCM services at any time (effective at the end of the month) by phone call to the office staff.   Patient did not agree to services and wishes to consider information provided before deciding about enrollment in care management services.   Follow up plan:   Earney [place] [facility] Scheduler

## 2019-05-26 ENCOUNTER — Ambulatory Visit (INDEPENDENT_AMBULATORY_CARE_PROVIDER_SITE_OTHER): Payer: PPO

## 2019-05-26 ENCOUNTER — Other Ambulatory Visit: Payer: Self-pay

## 2019-05-26 VITALS — BP 118/78 | HR 73 | Temp 97.8°F | Resp 16 | Ht 62.0 in | Wt 205.0 lb

## 2019-05-26 DIAGNOSIS — Z0000 Encounter for general adult medical examination without abnormal findings: Secondary | ICD-10-CM

## 2019-05-26 NOTE — Patient Instructions (Signed)
 Fall Prevention in the Home, Adult Falls can cause injuries. They can happen to people of all ages. There are many things you can do to make your home safe and to help prevent falls. Ask for help when making these changes, if needed. What actions can I take to prevent falls? General Instructions  Use good lighting in all rooms. Replace any light bulbs that burn out.  Turn on the lights when you go into a dark area. Use night-lights.  Keep items that you use often in easy-to-reach places. Lower the shelves around your home if necessary.  Set up your furniture so you have a clear path. Avoid moving your furniture around.  Do not have throw rugs and other things on the floor that can make you trip.  Avoid walking on wet floors.  If any of your floors are uneven, fix them.  Add color or contrast paint or tape to clearly mark and help you see: ? Any grab bars or handrails. ? First and last steps of stairways. ? Where the edge of each step is.  If you use a stepladder: ? Make sure that it is fully opened. Do not climb a closed stepladder. ? Make sure that both sides of the stepladder are locked into place. ? Ask someone to hold the stepladder for you while you use it.  If there are any pets around you, be aware of where they are. What can I do in the bathroom?      Keep the floor dry. Clean up any water that spills onto the floor as soon as it happens.  Remove soap buildup in the tub or shower regularly.  Use non-skid mats or decals on the floor of the tub or shower.  Attach bath mats securely with double-sided, non-slip rug tape.  If you need to sit down in the shower, use a plastic, non-slip stool.  Install grab bars by the toilet and in the tub and shower. Do not use towel bars as grab bars. What can I do in the bedroom?  Make sure that you have a light by your bed that is easy to reach.  Do not use any sheets or blankets that are too big for your bed. They should  not hang down onto the floor.  Have a firm chair that has side arms. You can use this for support while you get dressed. What can I do in the kitchen?  Clean up any spills right away.  If you need to reach something above you, use a strong step stool that has a grab bar.  Keep electrical cords out of the way.  Do not use floor polish or wax that makes floors slippery. If you must use wax, use non-skid floor wax. What can I do with my stairs?  Do not leave any items on the stairs.  Make sure that you have a light switch at the top of the stairs and the bottom of the stairs. If you do not have them, ask someone to add them for you.  Make sure that there are handrails on both sides of the stairs, and use them. Fix handrails that are broken or loose. Make sure that handrails are as long as the stairways.  Install non-slip stair treads on all stairs in your home.  Avoid having throw rugs at the top or bottom of the stairs. If you do have throw rugs, attach them to the floor with carpet tape.  Choose a carpet that   does not hide the edge of the steps on the stairway.  Check any carpeting to make sure that it is firmly attached to the stairs. Fix any carpet that is loose or worn. What can I do on the outside of my home?  Use bright outdoor lighting.  Regularly fix the edges of walkways and driveways and fix any cracks.  Remove anything that might make you trip as you walk through a door, such as a raised step or threshold.  Trim any bushes or trees on the path to your home.  Regularly check to see if handrails are loose or broken. Make sure that both sides of any steps have handrails.  Install guardrails along the edges of any raised decks and porches.  Clear walking paths of anything that might make someone trip, such as tools or rocks.  Have any leaves, snow, or ice cleared regularly.  Use sand or salt on walking paths during winter.  Clean up any spills in your garage right  away. This includes grease or oil spills. What other actions can I take?  Wear shoes that: ? Have a low heel. Do not wear high heels. ? Have rubber bottoms. ? Are comfortable and fit you well. ? Are closed at the toe. Do not wear open-toe sandals.  Use tools that help you move around (mobility aids) if they are needed. These include: ? Canes. ? Walkers. ? Scooters. ? Crutches.  Review your medicines with your doctor. Some medicines can make you feel dizzy. This can increase your chance of falling. Ask your doctor what other things you can do to help prevent falls. Where to find more information  Centers for Disease Control and Prevention, STEADI: https://cdc.gov  National Institute on Aging: https://go4life.nia.nih.gov Contact a doctor if:  You are afraid of falling at home.  You feel weak, drowsy, or dizzy at home.  You fall at home. Summary  There are many simple things that you can do to make your home safe and to help prevent falls.  Ways to make your home safe include removing tripping hazards and installing grab bars in the bathroom.  Ask for help when making these changes in your home. This information is not intended to replace advice given to you by your health care provider. Make sure you discuss any questions you have with your health care provider. Document Revised: 04/30/2018 Document Reviewed: 08/22/2016 Elsevier Patient Education  2020 Elsevier Inc.   Health Maintenance, Female Adopting a healthy lifestyle and getting preventive care are important in promoting health and wellness. Ask your health care provider about:  The right schedule for you to have regular tests and exams.  Things you can do on your own to prevent diseases and keep yourself healthy. What should I know about diet, weight, and exercise? Eat a healthy diet   Eat a diet that includes plenty of vegetables, fruits, low-fat dairy products, and lean protein.  Do not eat a lot of foods  that are high in solid fats, added sugars, or sodium. Maintain a healthy weight Body mass index (BMI) is used to identify weight problems. It estimates body fat based on height and weight. Your health care provider can help determine your BMI and help you achieve or maintain a healthy weight. Get regular exercise Get regular exercise. This is one of the most important things you can do for your health. Most adults should:  Exercise for at least 150 minutes each week. The exercise should increase your heart rate   and make you sweat (moderate-intensity exercise).  Do strengthening exercises at least twice a week. This is in addition to the moderate-intensity exercise.  Spend less time sitting. Even light physical activity can be beneficial. Watch cholesterol and blood lipids Have your blood tested for lipids and cholesterol at 62 years of age, then have this test every 5 years. Have your cholesterol levels checked more often if:  Your lipid or cholesterol levels are high.  You are older than 62 years of age.  You are at high risk for heart disease. What should I know about cancer screening? Depending on your health history and family history, you may need to have cancer screening at various ages. This may include screening for:  Breast cancer.  Cervical cancer.  Colorectal cancer.  Skin cancer.  Lung cancer. What should I know about heart disease, diabetes, and high blood pressure? Blood pressure and heart disease  High blood pressure causes heart disease and increases the risk of stroke. This is more likely to develop in people who have high blood pressure readings, are of African descent, or are overweight.  Have your blood pressure checked: ? Every 3-5 years if you are 18-39 years of age. ? Every year if you are 40 years old or older. Diabetes Have regular diabetes screenings. This checks your fasting blood sugar level. Have the screening done:  Once every three years after  age 40 if you are at a normal weight and have a low risk for diabetes.  More often and at a younger age if you are overweight or have a high risk for diabetes. What should I know about preventing infection? Hepatitis B If you have a higher risk for hepatitis B, you should be screened for this virus. Talk with your health care provider to find out if you are at risk for hepatitis B infection. Hepatitis C Testing is recommended for:  Everyone born from 1945 through 1965.  Anyone with known risk factors for hepatitis C. Sexually transmitted infections (STIs)  Get screened for STIs, including gonorrhea and chlamydia, if: ? You are sexually active and are younger than 62 years of age. ? You are older than 62 years of age and your health care provider tells you that you are at risk for this type of infection. ? Your sexual activity has changed since you were last screened, and you are at increased risk for chlamydia or gonorrhea. Ask your health care provider if you are at risk.  Ask your health care provider about whether you are at high risk for HIV. Your health care provider may recommend a prescription medicine to help prevent HIV infection. If you choose to take medicine to prevent HIV, you should first get tested for HIV. You should then be tested every 3 months for as long as you are taking the medicine. Pregnancy  If you are about to stop having your period (premenopausal) and you may become pregnant, seek counseling before you get pregnant.  Take 400 to 800 micrograms (mcg) of folic acid every day if you become pregnant.  Ask for birth control (contraception) if you want to prevent pregnancy. Osteoporosis and menopause Osteoporosis is a disease in which the bones lose minerals and strength with aging. This can result in bone fractures. If you are 65 years old or older, or if you are at risk for osteoporosis and fractures, ask your health care provider if you should:  Be screened for  bone loss.  Take a calcium or vitamin   D supplement to lower your risk of fractures.  Be given hormone replacement therapy (HRT) to treat symptoms of menopause. Follow these instructions at home: Lifestyle  Do not use any products that contain nicotine or tobacco, such as cigarettes, e-cigarettes, and chewing tobacco. If you need help quitting, ask your health care provider.  Do not use street drugs.  Do not share needles.  Ask your health care provider for help if you need support or information about quitting drugs. Alcohol use  Do not drink alcohol if: ? Your health care provider tells you not to drink. ? You are pregnant, may be pregnant, or are planning to become pregnant.  If you drink alcohol: ? Limit how much you use to 0-1 drink a day. ? Limit intake if you are breastfeeding.  Be aware of how much alcohol is in your drink. In the [place] one drink equals one 12 oz bottle of beer (355 mL), one 5 oz glass of wine (148 mL), or one 1 oz glass of hard liquor (44 mL). General instructions  Schedule regular health, dental, and eye exams.  Stay current with your vaccines.  Tell your health care provider if: ? You often feel depressed. ? You have ever been abused or do not feel safe at home. Summary  Adopting a healthy lifestyle and getting preventive care are important in promoting health and wellness.  Follow your health care provider's instructions about healthy diet, exercising, and getting tested or screened for diseases.  Follow your health care provider's instructions on monitoring your cholesterol and blood pressure. This information is not intended to replace advice given to you by your health care provider. Make sure you discuss any questions you have with your health care provider. Document Revised: 12/31/2017 Document Reviewed: 12/31/2017 Elsevier Patient Education  2020 Elsevier Inc.  

## 2019-05-26 NOTE — Progress Notes (Signed)
Subjective:   Andrea White is a 64 y.o. female who presents for Medicare Annual (Subsequent) preventive examination.  This wellness visit is conducted by a nurse.  The patient's medications were reviewed and reconciled since the patient's last visit.  History details were provided by the patient.  The history appears to be reliable.    Patient's last AWV was one year ago.   Medical History: Patient history and Family history was reviewed  Medications, Allergies, and preventative health maintenance was reviewed and updated.   Review of Systems:  Review of Systems  Constitutional: Negative.   HENT: Negative.   Eyes: Negative.  Negative for pain, discharge and itching.  Respiratory: Negative.  Negative for cough, chest tightness and shortness of breath.   Cardiovascular: Negative.  Negative for chest pain and palpitations.  Gastrointestinal: Negative.  Negative for constipation, diarrhea and nausea.  Genitourinary: Negative.  Negative for dysuria and frequency.  Musculoskeletal: Positive for arthralgias and back pain.  Allergic/Immunologic: Positive for environmental allergies.  Neurological: Negative for dizziness, weakness and headaches.  Psychiatric/Behavioral: Negative for agitation, confusion and suicidal ideas. The patient is not nervous/anxious.     Cardiac Risk Factors include: diabetes mellitus     Objective:     Vitals: BP 118/78 (BP Location: Left Arm, Patient Position: Sitting, Cuff Size: Large)   Pulse 73   Temp 97.8 F (36.6 C) (Temporal)   Resp 16   Ht 5\' 2"  (1.575 m)   Wt 205 lb (93 kg)   SpO2 95%   BMI 37.49 kg/m   Body mass index is 37.49 kg/m.  Advanced Directives 05/26/2019 09/25/2017 06/06/2016  Does Patient Have a Medical Advance Directive? Yes No No  Type of Paramedic of [place];Living will - -  Does patient want to make changes to medical advance directive? No - Patient declined - -  Copy of [place] in  Chart? No - copy requested - -  Would patient like information on creating a medical advance directive? - No - Patient declined No - Patient declined    Tobacco Social History   Tobacco Use  Smoking Status Never Smoker  Smokeless Tobacco Never Used      Clinical Intake:  Pre-visit preparation completed: Yes  Pain : 0-10 Pain Score: 4  Pain Type: Chronic pain Pain Location: Back Pain Onset: Other (comment)(chronic) Pain Frequency: Intermittent Pain Relieving Factors: Tramadol, Voltaren, Ibuprofen   BMI - recorded: 37.49 Nutritional Status: BMI > 30  Obese Nutritional Risks: None Diabetes: Yes CBG done?: No Did pt. bring in CBG monitor from home?: No  How often do you need to have someone help you when you read instructions, pamphlets, or other written materials from your doctor or pharmacy?: 1 - Never  Interpreter Needed?: No     Past Medical History:  Diagnosis Date  . Allergy   . Anxiety   . Arthritis   . Bradycardia, unspecified   . Bruises easily   . Calf pain    when walking  . Fibromyalgia   . Frequent headaches   . GERD (gastroesophageal reflux disease)   . H/O bladder problems   . Hypertension   . IBS (irritable bowel syndrome)   . Localized edema   . Mild persistent asthma, uncomplicated   . Morbid (severe) obesity due to excess calories ([place])   . Muscle pain   . Myocardial infarction ([place])   . Other hypotension   . Ovarian cyst   . Slow transit  constipation   . Solitary pulmonary nodule   . Swelling    feet and legs  . Type 2 diabetes mellitus with diabetic nephropathy ([place])   . Varicose veins of bilateral lower extremities with pain    Past Surgical History:  Procedure Laterality Date  . BUNIONECTOMY     left foot 5th toe  . CARDIAC CATHETERIZATION    . carpel tunnel surgery    . CESAREAN SECTION    . CHOLECYSTECTOMY    . GALLBLADDER SURGERY    . TONSILLECTOMY    . TUBAL LIGATION    . VAGINAL HYSTERECTOMY  2003   LAVH/SR    Family History  Problem Relation Age of Onset  . Heart disease Father   . Hypertension Father   . Stroke Father   . Heart attack Father   . Cerebrovascular Accident Father   . Diabetes Sister   . Kidney disease Sister   . Migraines Daughter   . Cancer Maternal Aunt 74       breast   Social History   Socioeconomic History  . Marital status: Married    Spouse name: Not on file  . Number of children: 1  . Years of education: Not on file  . Highest education level: Not on file  Occupational History  . Occupation: Disabled due to foot problems since 1993  Tobacco Use  . Smoking status: Never Smoker  . Smokeless tobacco: Never Used  Substance and Sexual Activity  . Alcohol use: No  . Drug use: No  . Sexual activity: Yes    Birth control/protection: Surgical    Comment: hyst  Other Topics Concern  . Not on file  Social History Narrative  . Not on file   Social Determinants of Health   Financial Resource Strain:   . Difficulty of Paying Living Expenses:   Food Insecurity:   . Worried About Charity fundraiser in the Last Year:   . Arboriculturist in the Last Year:   Transportation Needs:   . Film/video editor (Medical):   Marland Kitchen Lack of Transportation (Non-Medical):   Physical Activity:   . Days of Exercise per Week:   . Minutes of Exercise per Session:   Stress:   . Feeling of Stress :   Social Connections:   . Frequency of Communication with Friends and Family:   . Frequency of Social Gatherings with Friends and Family:   . Attends Religious Services:   . Active Member of Clubs or Organizations:   . Attends Archivist Meetings:   Marland Kitchen Marital Status:     Outpatient Encounter Medications as of 05/26/2019  Medication Sig  . albuterol (PROVENTIL HFA;VENTOLIN HFA) 108 (90 Base) MCG/ACT inhaler Inhale into the lungs every 6 (six) hours as needed for wheezing or shortness of breath.  . ALPRAZolam (XANAX) 0.5 MG tablet Take 0.5 mg by mouth 3 (three) times  daily as needed. For anxiety  . Biotin 10000 MCG TABS Take 1 tablet by mouth daily.  . diclofenac (VOLTAREN) 75 MG EC tablet Take 75 mg by mouth 2 (two) times daily.  . diclofenac Sodium (VOLTAREN) 1 % GEL   . DULoxetine (CYMBALTA) 60 MG capsule   . furosemide (LASIX) 20 MG tablet Take 20 mg by mouth daily as needed for edema.  Marland Kitchen ibuprofen (ADVIL,MOTRIN) 200 MG tablet Take 200 mg by mouth every 6 (six) hours as needed.  . loratadine (CLARITIN) 10 MG tablet Take 10 mg by mouth daily.  Marland Kitchen  meclizine (ANTIVERT) 25 MG tablet   . metFORMIN (GLUCOPHAGE) 500 MG tablet Take 500 mg by mouth 2 (two) times daily with a meal.  . omeprazole (PRILOSEC) 40 MG capsule Take 40 mg by mouth 2 (two) times daily.  Glory Rosebush DELICA LANCETS 85Y MISC   . ONETOUCH ULTRA test strip USE TO CHECK BLOOD SUGAR ONCE DAILY.  . pravastatin (PRAVACHOL) 20 MG tablet Take 20 mg by mouth daily.  Marland Kitchen spironolactone-hydrochlorothiazide (ALDACTAZIDE) 25-25 MG tablet Take 1 tablet by mouth daily.  . traMADol (ULTRAM) 50 MG tablet Take 50 mg by mouth 3 (three) times daily as needed.   No facility-administered encounter medications on file as of 05/26/2019.    Activities of Daily Living In your present state of health, do you have any difficulty performing the following activities: 05/26/2019  Hearing? N  Vision? N  Difficulty concentrating or making decisions? N  Walking or climbing stairs? N  Dressing or bathing? N  Doing errands, shopping? N  Preparing Food and eating ? N  Using the Toilet? N  In the past six months, have you accidently leaked urine? N  Do you have problems with loss of bowel control? N  Managing your Medications? N  Managing your Finances? N  Housekeeping or managing your Housekeeping? N  Some recent data might be hidden    Patient Care Team: [name], MD as PCP - General (Family Medicine) Joya Salm, MD as Referring Physician (Orthopedic Surgery) Landis Martins, DPM as Consulting Physician  (Podiatry) Delsa Bern, MD as Consulting Physician (Obstetrics and Gynecology) Lynnell Dike, OD (Optometry)    Assessment:   This is a routine wellness examination for [place].  Exercise Activities and Dietary recommendations Current Exercise Habits: The patient does not participate in regular exercise at present, Exercise limited by: orthopedic condition(s)  Goals    . Advanced Care Planning complete by 06/07/19     Bring to office to copy and add to file    . HEMOGLOBIN A1C < 7     A1C on 04/30/19 was 6.5    . Increase physical activity     Increase activity as tolerated       Fall Risk Fall Risk  05/26/2019 04/30/2019  Falls in the past year? 0 0  Number falls in past yr: 0 0  Injury with Fall? 0 0  Risk for fall due to : No Fall Risks -  Follow up Falls prevention discussed;Falls evaluation completed Falls evaluation completed   Is the patient's home free of loose throw rugs in walkways, pet beds, electrical cords, etc?   yes      Grab bars in the bathroom? no      Handrails on the stairs?   yes      Adequate lighting?   yes  Depression Screen PHQ 2/9 Scores 05/26/2019 04/30/2019 04/30/2019 09/25/2017  PHQ - 2 Score 0 0 0 0     Cognitive Function     6CIT Screen 05/26/2019  What Year? 0 points  What month? 0 points  What time? 0 points  Count back from 20 0 points  Months in reverse 0 points  Repeat phrase 0 points  Total Score 0    Immunization History  Administered Date(s) Administered  . Influenza-Unspecified 11/03/2017, 09/25/2018  . Pneumococcal Polysaccharide-23 11/06/2015  . Tdap 08/29/2015    Screening Tests Health Maintenance  Topic Date Due  . Hepatitis C Screening  Never done  . HIV Screening  Never done  . COVID-19  Vaccine (1) Never done  . PAP SMEAR-Modifier  01/22/2016  . MAMMOGRAM  07/16/2019  . INFLUENZA VACCINE  08/22/2019  . OPHTHALMOLOGY EXAM  09/18/2019  . HEMOGLOBIN A1C  10/30/2019  . FOOT EXAM  12/10/2019  . URINE MICROALBUMIN   04/29/2020  . COLONOSCOPY  01/21/2021  . TETANUS/TDAP  08/28/2025  . PNEUMOCOCCAL POLYSACCHARIDE VACCINE AGE 49-64 HIGH RISK  Completed    Cancer Screenings: Lung: Low Dose CT Chest recommended if Age 35-80 years, 30 pack-year currently smoking OR have quit w/in 15years. Patient does not qualify. Breast:  Up to date on Mammogram? Yes   Colorectal: Due 2023      Plan:    Counseling was provided today regarding the following topics: healthy eating habits, home safety, vitamin and mineral supplementation (calcium and Vit D), regular exercise, breast self-exam, tobacco avoidance, limitation of alcohol intake, use of seat belts, firearm safety, and fall prevention.  Annual recommendations include: influenza vaccine, dental cleanings, and eye exams.    I have personally reviewed and noted the following in the patient's chart:   . Medical and social history . Use of alcohol, tobacco or illicit drugs  . Current medications and supplements . Functional ability and status . Nutritional status . Physical activity . Advanced directives . List of other physicians . Hospitalizations, surgeries, and ER visits in previous 12 months . Vitals . Screenings to include cognitive, depression, and falls . Referrals and appointments  In addition, I have reviewed and discussed with patient certain preventive protocols, quality metrics, and best practice recommendations. A written personalized care plan for preventive services as well as general preventive health recommendations were provided to patient.     Erie Noe, LPN  06/29/319

## 2019-05-28 ENCOUNTER — Other Ambulatory Visit: Payer: Self-pay | Admitting: Family Medicine

## 2019-06-17 ENCOUNTER — Ambulatory Visit: Payer: PPO | Admitting: Sports Medicine

## 2019-06-18 ENCOUNTER — Other Ambulatory Visit: Payer: Self-pay

## 2019-06-18 ENCOUNTER — Encounter: Payer: Self-pay | Admitting: Sports Medicine

## 2019-06-18 ENCOUNTER — Ambulatory Visit: Payer: PPO | Admitting: Sports Medicine

## 2019-06-18 DIAGNOSIS — M19072 Primary osteoarthritis, left ankle and foot: Secondary | ICD-10-CM

## 2019-06-18 DIAGNOSIS — M2142 Flat foot [pes planus] (acquired), left foot: Secondary | ICD-10-CM

## 2019-06-18 DIAGNOSIS — M25571 Pain in right ankle and joints of right foot: Secondary | ICD-10-CM | POA: Diagnosis not present

## 2019-06-18 DIAGNOSIS — M7751 Other enthesopathy of right foot: Secondary | ICD-10-CM

## 2019-06-18 DIAGNOSIS — M19071 Primary osteoarthritis, right ankle and foot: Secondary | ICD-10-CM | POA: Diagnosis not present

## 2019-06-18 DIAGNOSIS — E1142 Type 2 diabetes mellitus with diabetic polyneuropathy: Secondary | ICD-10-CM

## 2019-06-18 DIAGNOSIS — I739 Peripheral vascular disease, unspecified: Secondary | ICD-10-CM

## 2019-06-18 DIAGNOSIS — M2141 Flat foot [pes planus] (acquired), right foot: Secondary | ICD-10-CM

## 2019-06-18 DIAGNOSIS — M21619 Bunion of unspecified foot: Secondary | ICD-10-CM

## 2019-06-18 NOTE — Progress Notes (Signed)
Subjective: Andrea White is a 64 y.o. female patient who returns to office for diabetic foot exam and is requesting evaluation for new shoes.  Patient also admits that she is having recurrent pain over the lateral side of her right foot and ankle.  Patient refuses a nail trim at this visit and reports that she got a $30 bill for it. Patient is diabetic her last blood sugar this morning was 109 and last A1c 6.5 and saw Dr. Tobie Poet PCP x 1 month ago.  Patient Active Problem List   Diagnosis Date Noted  . Diabetic polyneuropathy associated with type 2 diabetes mellitus ([place]) 04/30/2019  . Essential hypertension 04/30/2019  . Mixed hyperlipidemia 04/30/2019  . Mixed diabetic hyperlipidemia associated with type 2 diabetes mellitus ([place]) 04/30/2019  . Major depressive disorder, single episode, mild ([place]) 04/30/2019  . Myocardial infarction ([place]) 10/31/2017  . Primary fibromyalgia syndrome 10/31/2017  . Gastroesophageal reflux disease 03/15/2015  . Asthma 03/15/2015  . Obesity 03/15/2015  . Herniated disc 06/10/2011  . History of heart attack 06/10/2011  . Cyst of ovary     Current Outpatient Medications on File Prior to Visit  Medication Sig Dispense Refill  . albuterol (PROVENTIL HFA;VENTOLIN HFA) 108 (90 Base) MCG/ACT inhaler Inhale into the lungs every 6 (six) hours as needed for wheezing or shortness of breath.    . ALPRAZolam (XANAX) 0.5 MG tablet Take 0.5 mg by mouth 3 (three) times daily as needed. For anxiety    . Biotin 10000 MCG TABS Take 1 tablet by mouth daily.    . diclofenac (VOLTAREN) 75 MG EC tablet Take 75 mg by mouth 2 (two) times daily.    . diclofenac Sodium (VOLTAREN) 1 % GEL     . DULoxetine (CYMBALTA) 60 MG capsule     . furosemide (LASIX) 20 MG tablet Take 20 mg by mouth daily as needed for edema.    Marland Kitchen ibuprofen (ADVIL,MOTRIN) 200 MG tablet Take 200 mg by mouth every 6 (six) hours as needed.    . loratadine (CLARITIN) 10 MG tablet Take 10 mg by mouth daily.    .  meclizine (ANTIVERT) 25 MG tablet TAKE 1 TABLET BY MOUTH 4 TIMES DAILY AS NEEDED. 90 tablet 1  . metFORMIN (GLUCOPHAGE) 500 MG tablet Take 500 mg by mouth 2 (two) times daily with a meal.    . omeprazole (PRILOSEC) 40 MG capsule Take 40 mg by mouth 2 (two) times daily.    Glory Rosebush DELICA LANCETS 44W MISC     . ONETOUCH ULTRA test strip USE TO CHECK BLOOD SUGAR ONCE DAILY. 100 each 5  . pravastatin (PRAVACHOL) 20 MG tablet Take 20 mg by mouth daily.    Marland Kitchen spironolactone-hydrochlorothiazide (ALDACTAZIDE) 25-25 MG tablet Take 1 tablet by mouth daily. 90 tablet 0  . traMADol (ULTRAM) 50 MG tablet Take 50 mg by mouth 3 (three) times daily as needed.     No current facility-administered medications on file prior to visit.    Allergies  Allergen Reactions  . Morphine And Related Other (See Comments)    Woozy, "out of body experience"  . Celebrex [Celecoxib] Other (See Comments)    Patient does not remember intolerance  . Gabapentin Other (See Comments)    Confusion  . Lisinopril Cough  . Guaifenesin & Derivatives Other (See Comments)    Patient cannot remember specific issue, just made her feel "weird"  . Lipitor [Atorvastatin] Rash  . Sulfa Antibiotics Rash  . Valium [Diazepam] Other (See Comments)  Lightheaded, dizziness     Objective:  General: Alert and oriented x3 in no acute distress  Dermatology: Nails x10 mildly elongated, thickened dystrophic consistent with onychomycosis.    Vascular: Dorsalis Pedis and Posterior Tibial pedal pulses palpable faintly after edema is displaced, Capillary Fill Time 5 seconds,(+) pedal hair growth bilateral, 1+ pitting edema bilateral lower extremities, Temperature gradient within normal limits.  Neurology: Johney Maine sensation intact via light touch bilateral, Protective sensation intact bilateral with Semmes Weinstein monofilament however vibratory sensation diminished bilateral.  Musculoskeletal: + pes planus foot type, bunion and hammertoe  deformity that is currently asymptomatic.  Mild pain to palpation right lateral ankle over the sinus tarsi likely secondary to impingement syndrome of lateral foot and ankle due to severe pes planus.  Assessment and Plan: Problem List Items Addressed This Visit      Endocrine   Diabetic polyneuropathy associated with type 2 diabetes mellitus ([place])    Other Visit Diagnoses    Tendonitis of ankle, right    -  Primary   Sinus tarsi syndrome, right       Pes planus of both feet       Bunion       Osteoarthritis of both feet, unspecified osteoarthritis type       PVD (peripheral vascular disease) ([place])          -Diabetic foot examination performed -Mechanically smooth using a dermal nails x10 using a without incident at no additional charge for the patient -Patient request for an injection to the right foot.  After oral consent and aseptic prep, injected a mixture containing 1 ml of 2%  plain lidocaine, 1 ml 0.5% plain marcaine, 0.5 ml of kenalog 10 and 0.5 ml of dexamethasone phosphate into right lateral ankle over the sinus tarsi without complication. Post-injection care discussed with patient.  -Patient to return to office as scheduled for diabetic shoe measurements  Landis Martins, DPM

## 2019-07-09 ENCOUNTER — Encounter: Payer: Self-pay | Admitting: Legal Medicine

## 2019-07-09 ENCOUNTER — Other Ambulatory Visit: Payer: Self-pay

## 2019-07-09 ENCOUNTER — Ambulatory Visit (INDEPENDENT_AMBULATORY_CARE_PROVIDER_SITE_OTHER): Payer: PPO | Admitting: Legal Medicine

## 2019-07-09 VITALS — BP 130/84 | HR 86 | Temp 97.3°F | Ht 62.0 in | Wt 204.6 lb

## 2019-07-09 DIAGNOSIS — T63451A Toxic effect of venom of hornets, accidental (unintentional), initial encounter: Secondary | ICD-10-CM | POA: Diagnosis not present

## 2019-07-09 DIAGNOSIS — T63461A Toxic effect of venom of wasps, accidental (unintentional), initial encounter: Secondary | ICD-10-CM | POA: Diagnosis not present

## 2019-07-09 DIAGNOSIS — T7840XA Allergy, unspecified, initial encounter: Secondary | ICD-10-CM

## 2019-07-09 DIAGNOSIS — T63441A Toxic effect of venom of bees, accidental (unintentional), initial encounter: Secondary | ICD-10-CM

## 2019-07-09 MED ORDER — PREDNISONE 5 MG PO TABS: 5.0000 mg | ORAL_TABLET | Freq: Every day | ORAL | 0 refills | Status: DC

## 2019-07-09 MED ORDER — TRIAMCINOLONE ACETONIDE 40 MG/ML IJ SUSP
80.0000 mg | Freq: Once | INTRAMUSCULAR | Status: AC
  Administered 2019-07-09: 80 mg via INTRAMUSCULAR

## 2019-07-09 MED ORDER — AZITHROMYCIN 250 MG PO TABS: ORAL_TABLET | ORAL | 0 refills | Status: DC

## 2019-07-09 NOTE — Progress Notes (Signed)
Acute Office Visit  Subjective:    Patient ID: Andrea White, female    DOB: [birth year], 64 y.o.   MRN: [id number]  Chief Complaint  Patient presents with  . Insect Bite    HPI Patient is in today for stung by yellow jacket bees yesterday at 4pm, Now she is having left arm swelling, painful, red.  Some nausea but no respiratory embarrassment  Past Medical History:  Diagnosis Date  . Allergy   . Anxiety   . Arthritis   . Bradycardia, unspecified   . Bruises easily   . Calf pain    when walking  . Fibromyalgia   . Frequent headaches   . GERD (gastroesophageal reflux disease)   . H/O bladder problems   . Hypertension   . IBS (irritable bowel syndrome)   . Localized edema   . Mild persistent asthma, uncomplicated   . Morbid (severe) obesity due to excess calories ([place])   . Muscle pain   . Myocardial infarction ([place])   . Other hypotension   . Ovarian cyst   . Slow transit constipation   . Solitary pulmonary nodule   . Swelling    feet and legs  . Type 2 diabetes mellitus with diabetic nephropathy ([place])   . Varicose veins of bilateral lower extremities with pain     Past Surgical History:  Procedure Laterality Date  . BUNIONECTOMY     left foot 5th toe  . CARDIAC CATHETERIZATION    . carpel tunnel surgery    . CESAREAN SECTION    . CHOLECYSTECTOMY    . GALLBLADDER SURGERY    . TONSILLECTOMY    . TUBAL LIGATION    . VAGINAL HYSTERECTOMY  2003   LAVH/SR    Family History  Problem Relation Age of Onset  . Heart disease Father   . Hypertension Father   . Stroke Father   . Heart attack Father   . Cerebrovascular Accident Father   . Diabetes Sister   . Kidney disease Sister   . Migraines Daughter   . Cancer Maternal Aunt 74       breast    Social History   Socioeconomic History  . Marital status: Married    Spouse name: Not on file  . Number of children: 1  . Years of education: Not on file  . Highest education level: Not on file  Occupational  History  . Occupation: Disabled due to foot problems since 1993  Tobacco Use  . Smoking status: Never Smoker  . Smokeless tobacco: Never Used  Vaping Use  . Vaping Use: Never used  Substance and Sexual Activity  . Alcohol use: No  . Drug use: No  . Sexual activity: Yes    Birth control/protection: Surgical    Comment: hyst  Other Topics Concern  . Not on file  Social History Narrative  . Not on file   Social Determinants of Health   Financial Resource Strain:   . Difficulty of Paying Living Expenses:   Food Insecurity:   . Worried About Charity fundraiser in the Last Year:   . Arboriculturist in the Last Year:   Transportation Needs:   . Film/video editor (Medical):   Marland Kitchen Lack of Transportation (Non-Medical):   Physical Activity:   . Days of Exercise per Week:   . Minutes of Exercise per Session:   Stress:   . Feeling of Stress :   Social Connections:   .  Frequency of Communication with Friends and Family:   . Frequency of Social Gatherings with Friends and Family:   . Attends Religious Services:   . Active Member of Clubs or Organizations:   . Attends Archivist Meetings:   Marland Kitchen Marital Status:   Intimate Partner Violence:   . Fear of Current or Ex-Partner:   . Emotionally Abused:   Marland Kitchen Physically Abused:   . Sexually Abused:     Outpatient Medications Prior to Visit  Medication Sig Dispense Refill  . albuterol (PROVENTIL HFA;VENTOLIN HFA) 108 (90 Base) MCG/ACT inhaler Inhale into the lungs every 6 (six) hours as needed for wheezing or shortness of breath.    . ALPRAZolam (XANAX) 0.5 MG tablet Take 0.5 mg by mouth 3 (three) times daily as needed. For anxiety    . Biotin 10000 MCG TABS Take 1 tablet by mouth daily.    . diclofenac (VOLTAREN) 75 MG EC tablet Take 75 mg by mouth 2 (two) times daily.    . diclofenac Sodium (VOLTAREN) 1 % GEL     . DULoxetine (CYMBALTA) 60 MG capsule     . furosemide (LASIX) 20 MG tablet Take 20 mg by mouth daily as needed  for edema.    Marland Kitchen ibuprofen (ADVIL,MOTRIN) 200 MG tablet Take 200 mg by mouth every 6 (six) hours as needed.    . loratadine (CLARITIN) 10 MG tablet Take 10 mg by mouth daily.    . meclizine (ANTIVERT) 25 MG tablet TAKE 1 TABLET BY MOUTH 4 TIMES DAILY AS NEEDED. 90 tablet 1  . metFORMIN (GLUCOPHAGE) 500 MG tablet Take 500 mg by mouth 2 (two) times daily with a meal.    . omeprazole (PRILOSEC) 40 MG capsule Take 40 mg by mouth 2 (two) times daily.    Glory Rosebush DELICA LANCETS 29J MISC     . ONETOUCH ULTRA test strip USE TO CHECK BLOOD SUGAR ONCE DAILY. 100 each 5  . pravastatin (PRAVACHOL) 20 MG tablet Take 20 mg by mouth daily.    . traMADol (ULTRAM) 50 MG tablet Take 50 mg by mouth 3 (three) times daily as needed.    Marland Kitchen spironolactone-hydrochlorothiazide (ALDACTAZIDE) 25-25 MG tablet Take 1 tablet by mouth daily. 90 tablet 0   No facility-administered medications prior to visit.    Allergies  Allergen Reactions  . Morphine And Related Other (See Comments)    Woozy, "out of body experience"  . Celebrex [Celecoxib] Other (See Comments)    Patient does not remember intolerance  . Gabapentin Other (See Comments)    Confusion  . Lisinopril Cough  . Guaifenesin & Derivatives Other (See Comments)    Patient cannot remember specific issue, just made her feel "weird"  . Lipitor [Atorvastatin] Rash  . Sulfa Antibiotics Rash  . Valium [Diazepam] Other (See Comments)    Lightheaded, dizziness     Review of Systems  Constitutional: Negative.   HENT: Negative.   Eyes: Negative.   Respiratory: Negative.   Cardiovascular: Negative.   Genitourinary: Negative.   Skin: Positive for rash.       Red rash left upper arm.  Neurological: Negative.        Objective:    Physical Exam Vitals reviewed.  Constitutional:      Appearance: Normal appearance.  HENT:     Head: Normocephalic and atraumatic.     Right Ear: Tympanic membrane normal.     Left Ear: Tympanic membrane normal.     Nose:  Nose normal.  Mouth/Throat:     Mouth: Mucous membranes are dry.  Cardiovascular:     Rate and Rhythm: Normal rate and regular rhythm.     Pulses: Normal pulses.     Heart sounds: Normal heart sounds.  Pulmonary:     Effort: Pulmonary effort is normal.     Breath sounds: Normal breath sounds.  Skin:    Findings: Rash present. Rash is papular and urticarial.          Comments: Large red rash 10 cm on left upper arm.  Neurological:     Mental Status: She is alert.     BP 130/84   Pulse 86   Temp (!) 97.3 F (36.3 C)   Ht 5\' 2"  (1.575 m)   Wt 204 lb 9.6 oz (92.8 kg)   SpO2 97%   BMI 37.42 kg/m  Wt Readings from Last 3 Encounters:  07/09/19 204 lb 9.6 oz (92.8 kg)  05/26/19 205 lb (93 kg)  04/30/19 205 lb (93 kg)    Health Maintenance Due  Topic Date Due  . Hepatitis C Screening  Never done  . COVID-19 Vaccine (1) Never done  . HIV Screening  Never done  . PAP SMEAR-Modifier  01/22/2016    There are no preventive care reminders to display for this patient.   No results found for: TSH Lab Results  Component Value Date   WBC 10.0 04/30/2019   HGB 13.2 04/30/2019   HCT 39.1 04/30/2019   MCV 91 04/30/2019   PLT 436 04/30/2019   Lab Results  Component Value Date   NA 143 04/30/2019   K 4.0 04/30/2019   CO2 25 04/30/2019   GLUCOSE 100 (H) 04/30/2019   BUN 13 04/30/2019   CREATININE 0.87 04/30/2019   BILITOT 0.5 04/30/2019   ALKPHOS 80 04/30/2019   AST 16 04/30/2019   ALT 13 04/30/2019   PROT 6.3 04/30/2019   ALBUMIN 3.9 04/30/2019   CALCIUM 9.6 04/30/2019   Lab Results  Component Value Date   CHOL 140 04/30/2019   Lab Results  Component Value Date   HDL 41 04/30/2019   Lab Results  Component Value Date   LDLCALC 68 04/30/2019   Lab Results  Component Value Date   TRIG 184 (H) 04/30/2019   Lab Results  Component Value Date   CHOLHDL 3.4 04/30/2019   Lab Results  Component Value Date   HGBA1C 6.5 (H) 04/30/2019       Assessment &  Plan:  1. Allergic reaction, initial encounter - triamcinolone acetonide (KENALOG-40) injection 80 mg - predniSONE (DELTASONE) 5 MG tablet; Take 1 tablet (5 mg total) by mouth daily with breakfast. Take 6ills first day , then 5 pills day 2 and then cut down one pill day until gone  Dispense: 21 tablet; Refill: 0 Patient having local reaction and no respiratory embarrassment.  2. Toxic reaction to hornets, wasps and bees, accidental or unintentional, initial encounter Stay away from bees, we discussed epipen.    Meds ordered this encounter  Medications  . triamcinolone acetonide (KENALOG-40) injection 80 mg  . predniSONE (DELTASONE) 5 MG tablet    Sig: Take 1 tablet (5 mg total) by mouth daily with breakfast. Take 6ills first day , then 5 pills day 2 and then cut down one pill day until gone    Dispense:  21 tablet    Refill:  0  . azithromycin (ZITHROMAX) 250 MG tablet    Sig: 2 tablets on day 1, then 1  tablet daily on days 2-6.    Dispense:  6 tablet    Refill:  0    No orders of the defined types were placed in this encounter.    Follow-up: Return if symptoms worsen or fail to improve.  An After Visit Summary was printed and given to the patient.  [place] [id number]

## 2019-07-10 MED ORDER — SPIRONOLACTONE-HCTZ 25-25 MG PO TABS: 1.0000 | ORAL_TABLET | Freq: Every day | ORAL | 1 refills | Status: DC

## 2019-07-12 ENCOUNTER — Other Ambulatory Visit: Payer: Self-pay | Admitting: Family Medicine

## 2019-07-27 ENCOUNTER — Other Ambulatory Visit: Payer: PPO | Admitting: Orthotics

## 2019-07-27 ENCOUNTER — Other Ambulatory Visit: Payer: Self-pay

## 2019-08-04 ENCOUNTER — Ambulatory Visit
Admission: RE | Admit: 2019-08-04 | Discharge: 2019-08-04 | Disposition: A | Discharge status: Home / Self Care | Payer: PPO | Source: Ambulatory Visit | Attending: Family Medicine | Admitting: Family Medicine

## 2019-08-04 ENCOUNTER — Other Ambulatory Visit: Payer: Self-pay | Admitting: Family Medicine

## 2019-08-04 ENCOUNTER — Other Ambulatory Visit: Payer: Self-pay | Admitting: *Deleted

## 2019-08-04 ENCOUNTER — Other Ambulatory Visit: Payer: Self-pay

## 2019-08-04 DIAGNOSIS — Z1231 Encounter for screening mammogram for malignant neoplasm of breast: Secondary | ICD-10-CM

## 2019-08-10 NOTE — Progress Notes (Signed)
Mammogram normal lp

## 2019-08-30 ENCOUNTER — Other Ambulatory Visit: Payer: Self-pay

## 2019-08-30 MED ORDER — OMEPRAZOLE 40 MG PO CPDR: 40.0000 mg | DELAYED_RELEASE_CAPSULE | Freq: Two times a day (BID) | ORAL | 0 refills | Status: DC

## 2019-08-30 MED ORDER — SPIRONOLACTONE-HCTZ 25-25 MG PO TABS: 1.0000 | ORAL_TABLET | Freq: Every day | ORAL | 1 refills | Status: DC

## 2019-08-30 MED ORDER — PRAVASTATIN SODIUM 20 MG PO TABS: 20.0000 mg | ORAL_TABLET | Freq: Every day | ORAL | 0 refills | Status: DC

## 2019-08-30 MED ORDER — ALPRAZOLAM 0.5 MG PO TABS: ORAL_TABLET | ORAL | 2 refills | Status: DC

## 2019-08-30 MED ORDER — FUROSEMIDE 20 MG PO TABS: 20.0000 mg | ORAL_TABLET | Freq: Every day | ORAL | 0 refills | Status: DC | PRN

## 2019-08-30 MED ORDER — TRAMADOL HCL 50 MG PO TABS: 50.0000 mg | ORAL_TABLET | Freq: Three times a day (TID) | ORAL | 0 refills | Status: DC | PRN

## 2019-08-30 MED ORDER — METFORMIN HCL 500 MG PO TABS: 500.0000 mg | ORAL_TABLET | Freq: Two times a day (BID) | ORAL | 0 refills | Status: DC

## 2019-08-30 NOTE — Telephone Encounter (Signed)
Patient left a voicemail stating she has a new insurance and needs all her medications sent to [facility] for 90 days.

## 2019-09-02 ENCOUNTER — Ambulatory Visit (INDEPENDENT_AMBULATORY_CARE_PROVIDER_SITE_OTHER): Payer: Medicare HMO | Admitting: Family Medicine

## 2019-09-02 ENCOUNTER — Other Ambulatory Visit: Payer: Self-pay

## 2019-09-02 ENCOUNTER — Encounter: Payer: Self-pay | Admitting: Family Medicine

## 2019-09-02 VITALS — BP 106/86 | HR 101 | Temp 96.4°F | Wt 204.0 lb

## 2019-09-02 DIAGNOSIS — L03032 Cellulitis of left toe: Secondary | ICD-10-CM

## 2019-09-02 MED ORDER — CEPHALEXIN 500 MG PO CAPS
500.0000 mg | ORAL_CAPSULE | Freq: Two times a day (BID) | ORAL | 0 refills | Status: DC
Start: 2019-09-02 — End: 2019-10-29

## 2019-09-02 NOTE — Progress Notes (Signed)
Acute Office Visit  Subjective:    Patient ID: Andrea White, female    DOB: Nov 07, 1955, 64 y.o.   MRN: [id number]  Chief Complaint  Patient presents with  . Callus on left foot on toe    HPI Patient is in today for left second toe medially has a callus that has drained some. She is a diabetic and is worried it is infected. Mildly painful.  Past Medical History:  Diagnosis Date  . Allergy   . Anxiety   . Arthritis   . Bradycardia, unspecified   . Bruises easily   . Calf pain    when walking  . Fibromyalgia   . Frequent headaches   . GERD (gastroesophageal reflux disease)   . H/O bladder problems   . Hypertension   . IBS (irritable bowel syndrome)   . Localized edema   . Mild persistent asthma, uncomplicated   . Morbid (severe) obesity due to excess calories ([place])   . Muscle pain   . Myocardial infarction ([place])   . Other hypotension   . Ovarian cyst   . Slow transit constipation   . Solitary pulmonary nodule   . Swelling    feet and legs  . Type 2 diabetes mellitus with diabetic nephropathy ([place])   . Varicose veins of bilateral lower extremities with pain     Past Surgical History:  Procedure Laterality Date  . BUNIONECTOMY     left foot 5th toe  . CARDIAC CATHETERIZATION    . carpel tunnel surgery    . CESAREAN SECTION    . CHOLECYSTECTOMY    . GALLBLADDER SURGERY    . TONSILLECTOMY    . TUBAL LIGATION    . VAGINAL HYSTERECTOMY  2003   LAVH/SR    Family History  Problem Relation Age of Onset  . Heart disease Father   . Hypertension Father   . Stroke Father   . Heart attack Father   . Cerebrovascular Accident Father   . Diabetes Sister   . Kidney disease Sister   . Migraines Daughter   . Cancer Maternal Aunt 74       breast    Social History   Socioeconomic History  . Marital status: Married    Spouse name: Not on file  . Number of children: 1  . Years of education: Not on file  . Highest education level: Not on file  Occupational  History  . Occupation: Disabled due to foot problems since 1993  Tobacco Use  . Smoking status: Never Smoker  . Smokeless tobacco: Never Used  Vaping Use  . Vaping Use: Never used  Substance and Sexual Activity  . Alcohol use: No  . Drug use: No  . Sexual activity: Yes    Birth control/protection: Surgical    Comment: hyst  Other Topics Concern  . Not on file  Social History Narrative  . Not on file   Social Determinants of Health   Financial Resource Strain:   . Difficulty of Paying Living Expenses:   Food Insecurity:   . Worried About Charity fundraiser in the Last Year:   . Arboriculturist in the Last Year:   Transportation Needs:   . Film/video editor (Medical):   Marland Kitchen Lack of Transportation (Non-Medical):   Physical Activity:   . Days of Exercise per Week:   . Minutes of Exercise per Session:   Stress:   . Feeling of Stress :   Social Connections:   .  Frequency of Communication with Friends and Family:   . Frequency of Social Gatherings with Friends and Family:   . Attends Religious Services:   . Active Member of Clubs or Organizations:   . Attends Archivist Meetings:   Marland Kitchen Marital Status:   Intimate Partner Violence:   . Fear of Current or Ex-Partner:   . Emotionally Abused:   Marland Kitchen Physically Abused:   . Sexually Abused:     Outpatient Medications Prior to Visit  Medication Sig Dispense Refill  . albuterol (PROVENTIL HFA;VENTOLIN HFA) 108 (90 Base) MCG/ACT inhaler Inhale into the lungs every 6 (six) hours as needed for wheezing or shortness of breath.    . ALPRAZolam (XANAX) 0.5 MG tablet TAKE 1/2 TO 1 TABLET BY MOUTH EVERY 8 HOURS AS NEEDED. 30 tablet 2  . Biotin 10000 MCG TABS Take 1 tablet by mouth daily.    . diclofenac (VOLTAREN) 75 MG EC tablet Take 75 mg by mouth 2 (two) times daily.    . diclofenac Sodium (VOLTAREN) 1 % GEL     . DULoxetine (CYMBALTA) 60 MG capsule     . furosemide (LASIX) 20 MG tablet Take 1 tablet (20 mg total) by mouth  daily as needed for edema. 90 tablet 0  . ibuprofen (ADVIL,MOTRIN) 200 MG tablet Take 200 mg by mouth every 6 (six) hours as needed.    . loratadine (CLARITIN) 10 MG tablet Take 10 mg by mouth daily.    . meclizine (ANTIVERT) 25 MG tablet TAKE 1 TABLET BY MOUTH 4 TIMES DAILY AS NEEDED. 90 tablet 1  . metFORMIN (GLUCOPHAGE) 500 MG tablet Take 1 tablet (500 mg total) by mouth 2 (two) times daily with a meal. 180 tablet 0  . omeprazole (PRILOSEC) 40 MG capsule Take 1 capsule (40 mg total) by mouth 2 (two) times daily. 180 capsule 0  . ONETOUCH DELICA LANCETS 18E MISC     . ONETOUCH ULTRA test strip USE TO CHECK BLOOD SUGAR ONCE DAILY. 100 each 5  . pravastatin (PRAVACHOL) 20 MG tablet Take 1 tablet (20 mg total) by mouth daily. 90 tablet 0  . spironolactone-hydrochlorothiazide (ALDACTAZIDE) 25-25 MG tablet Take 1 tablet by mouth daily. 90 tablet 1  . traMADol (ULTRAM) 50 MG tablet Take 1 tablet (50 mg total) by mouth 3 (three) times daily as needed. 90 tablet 0  . azithromycin (ZITHROMAX) 250 MG tablet 2 tablets on day 1, then 1 tablet daily on days 2-6. 6 tablet 0  . predniSONE (DELTASONE) 5 MG tablet Take 1 tablet (5 mg total) by mouth daily with breakfast. Take 6ills first day , then 5 pills day 2 and then cut down one pill day until gone 21 tablet 0   No facility-administered medications prior to visit.    Allergies  Allergen Reactions  . Morphine And Related Other (See Comments)    Woozy, "out of body experience"  . Celebrex [Celecoxib] Other (See Comments)    Patient does not remember intolerance  . Gabapentin Other (See Comments)    Confusion  . Lisinopril Cough  . Guaifenesin & Derivatives Other (See Comments)    Patient cannot remember specific issue, just made her feel "weird"  . Lipitor [Atorvastatin] Rash  . Sulfa Antibiotics Rash  . Valium [Diazepam] Other (See Comments)    Lightheaded, dizziness     Review of Systems  Constitutional: Negative for fever.        Objective:    Physical Exam Vitals reviewed.  Constitutional:  Appearance: Normal appearance.  Musculoskeletal:     Comments: Second toe of left foot callus. Draining, tender, mild erythema.  Neurological:     Mental Status: She is alert.     BP 106/86 (BP Location: Left Arm, Patient Position: Sitting)   Pulse (!) 101   Temp (!) 96.4 F (35.8 C) (Temporal)   Wt 204 lb (92.5 kg)   SpO2 98%   BMI 37.31 kg/m  Wt Readings from Last 3 Encounters:  09/02/19 204 lb (92.5 kg)  07/09/19 204 lb 9.6 oz (92.8 kg)  05/26/19 205 lb (93 kg)    Health Maintenance Due  Topic Date Due  . Hepatitis C Screening  Never done  . COVID-19 Vaccine (1) Never done  . HIV Screening  Never done  . PAP SMEAR-Modifier  01/22/2016  . INFLUENZA VACCINE  08/22/2019    There are no preventive care reminders to display for this patient.   No results found for: TSH Lab Results  Component Value Date   WBC 10.0 04/30/2019   HGB 13.2 04/30/2019   HCT 39.1 04/30/2019   MCV 91 04/30/2019   PLT 436 04/30/2019   Lab Results  Component Value Date   NA 143 04/30/2019   K 4.0 04/30/2019   CO2 25 04/30/2019   GLUCOSE 100 (H) 04/30/2019   BUN 13 04/30/2019   CREATININE 0.87 04/30/2019   BILITOT 0.5 04/30/2019   ALKPHOS 80 04/30/2019   AST 16 04/30/2019   ALT 13 04/30/2019   PROT 6.3 04/30/2019   ALBUMIN 3.9 04/30/2019   CALCIUM 9.6 04/30/2019   Lab Results  Component Value Date   CHOL 140 04/30/2019   Lab Results  Component Value Date   HDL 41 04/30/2019   Lab Results  Component Value Date   LDLCALC 68 04/30/2019   Lab Results  Component Value Date   TRIG 184 (H) 04/30/2019   Lab Results  Component Value Date   CHOLHDL 3.4 04/30/2019   Lab Results  Component Value Date   HGBA1C 6.5 (H) 04/30/2019       Assessment & Plan:  1. Cellulitis of second toe of left foot  Start on keflex. If does not improve or if worsens, pt to return next week.   Meds ordered this  encounter  Medications  . cephALEXin (KEFLEX) 500 MG capsule    Sig: Take 1 capsule (500 mg total) by mouth 2 (two) times daily.    Dispense:  14 capsule    Refill:  0    No orders of the defined types were placed in this encounter.    Follow-up: No follow-ups on file.  An After Visit Summary was printed and given to the patient.  [name] [facility] [phone]

## 2019-09-03 ENCOUNTER — Ambulatory Visit: Payer: Medicare HMO | Admitting: Family Medicine

## 2019-09-07 ENCOUNTER — Other Ambulatory Visit: Payer: Self-pay

## 2019-09-07 MED ORDER — TRAMADOL HCL 50 MG PO TABS: 50.0000 mg | ORAL_TABLET | Freq: Three times a day (TID) | ORAL | 1 refills | Status: AC | PRN

## 2019-09-15 ENCOUNTER — Encounter: Payer: Self-pay | Admitting: Sports Medicine

## 2019-09-15 ENCOUNTER — Other Ambulatory Visit: Payer: Self-pay

## 2019-09-15 ENCOUNTER — Ambulatory Visit: Payer: PPO | Admitting: Sports Medicine

## 2019-09-15 DIAGNOSIS — M2141 Flat foot [pes planus] (acquired), right foot: Secondary | ICD-10-CM

## 2019-09-15 DIAGNOSIS — M19072 Primary osteoarthritis, left ankle and foot: Secondary | ICD-10-CM

## 2019-09-15 DIAGNOSIS — M79675 Pain in left toe(s): Secondary | ICD-10-CM | POA: Diagnosis not present

## 2019-09-15 DIAGNOSIS — M2142 Flat foot [pes planus] (acquired), left foot: Secondary | ICD-10-CM | POA: Diagnosis not present

## 2019-09-15 DIAGNOSIS — M19071 Primary osteoarthritis, right ankle and foot: Secondary | ICD-10-CM | POA: Diagnosis not present

## 2019-09-15 DIAGNOSIS — M21619 Bunion of unspecified foot: Secondary | ICD-10-CM

## 2019-09-15 DIAGNOSIS — E1142 Type 2 diabetes mellitus with diabetic polyneuropathy: Secondary | ICD-10-CM | POA: Diagnosis not present

## 2019-09-15 DIAGNOSIS — M79674 Pain in right toe(s): Secondary | ICD-10-CM

## 2019-09-15 DIAGNOSIS — B351 Tinea unguium: Secondary | ICD-10-CM

## 2019-09-15 NOTE — Progress Notes (Signed)
Subjective: Andrea White is a 64 y.o. female patient who returns to office for diabetic foot exam with nail trim and pickup of diabetic shoes.  Patient reports she had one episode a few weeks ago where there was some rubbing at her left second toe PCP gave antibiotics and since that has done a lot better and scabbed over last visit to PCP Dr. Tobie Poet was 2 weeks ago.  Fasting blood sugar 124 A1c unknown  Patient Active Problem List   Diagnosis Date Noted  . Diabetic polyneuropathy associated with type 2 diabetes mellitus ([place]) 04/30/2019  . Essential hypertension 04/30/2019  . Mixed hyperlipidemia 04/30/2019  . Mixed diabetic hyperlipidemia associated with type 2 diabetes mellitus ([place]) 04/30/2019  . Major depressive disorder, single episode, mild ([place]) 04/30/2019  . Myocardial infarction ([place]) 10/31/2017  . Primary fibromyalgia syndrome 10/31/2017  . Gastroesophageal reflux disease 03/15/2015  . Asthma 03/15/2015  . Obesity 03/15/2015  . Herniated disc 06/10/2011  . History of heart attack 06/10/2011  . Cyst of ovary     Current Outpatient Medications on File Prior to Visit  Medication Sig Dispense Refill  . albuterol (PROVENTIL HFA;VENTOLIN HFA) 108 (90 Base) MCG/ACT inhaler Inhale into the lungs every 6 (six) hours as needed for wheezing or shortness of breath.    . ALPRAZolam (XANAX) 0.5 MG tablet TAKE 1/2 TO 1 TABLET BY MOUTH EVERY 8 HOURS AS NEEDED. 30 tablet 2  . Biotin 10000 MCG TABS Take 1 tablet by mouth daily.    . cephALEXin (KEFLEX) 500 MG capsule Take 1 capsule (500 mg total) by mouth 2 (two) times daily. 14 capsule 0  . diclofenac (VOLTAREN) 75 MG EC tablet Take 75 mg by mouth 2 (two) times daily.    . diclofenac Sodium (VOLTAREN) 1 % GEL     . DULoxetine (CYMBALTA) 60 MG capsule     . furosemide (LASIX) 20 MG tablet Take 1 tablet (20 mg total) by mouth daily as needed for edema. 90 tablet 0  . ibuprofen (ADVIL,MOTRIN) 200 MG tablet Take 200 mg by mouth every 6 (six)  hours as needed.    . loratadine (CLARITIN) 10 MG tablet Take 10 mg by mouth daily.    . meclizine (ANTIVERT) 25 MG tablet TAKE 1 TABLET BY MOUTH 4 TIMES DAILY AS NEEDED. 90 tablet 1  . metFORMIN (GLUCOPHAGE) 500 MG tablet Take 1 tablet (500 mg total) by mouth 2 (two) times daily with a meal. 180 tablet 0  . omeprazole (PRILOSEC) 40 MG capsule Take 1 capsule (40 mg total) by mouth 2 (two) times daily. 180 capsule 0  . ONETOUCH DELICA LANCETS 82N MISC     . ONETOUCH ULTRA test strip USE TO CHECK BLOOD SUGAR ONCE DAILY. 100 each 5  . pravastatin (PRAVACHOL) 20 MG tablet Take 1 tablet (20 mg total) by mouth daily. 90 tablet 0  . spironolactone-hydrochlorothiazide (ALDACTAZIDE) 25-25 MG tablet Take 1 tablet by mouth daily. 90 tablet 1  . traMADol (ULTRAM) 50 MG tablet Take 1 tablet (50 mg total) by mouth 3 (three) times daily as needed. 90 tablet 1   No current facility-administered medications on file prior to visit.    Allergies  Allergen Reactions  . Morphine And Related Other (See Comments)    Woozy, "out of body experience"  . Celebrex [Celecoxib] Other (See Comments)    Patient does not remember intolerance  . Gabapentin Other (See Comments)    Confusion  . Lisinopril Cough  . Guaifenesin & Derivatives Other (  See Comments)    Patient cannot remember specific issue, just made her feel "weird"  . Lipitor [Atorvastatin] Rash  . Sulfa Antibiotics Rash  . Valium [Diazepam] Other (See Comments)    Lightheaded, dizziness     Objective:  General: Alert and oriented x3 in no acute distress  Dermatology: Nails x10 elongated thickened dystrophic consistent with onychomycosis.  There is mild reactive callus noted to the medial left second toe without signs of infection.  Vascular: Dorsalis Pedis and Posterior Tibial pedal pulses palpable, Capillary Fill Time 5 seconds,(+) pedal hair growth bilateral, no edema bilateral lower extremities, Temperature gradient within normal  limits.  Neurology: Johney Maine sensation intact via light touch bilateral.  Musculoskeletal: + pes planus foot type, bunion and hammertoe deformity that is currently asymptomatic.  Gait: Able to ambulate at least 10 feet with diabetic shoes with comfort  Assessment and Plan: Problem List Items Addressed This Visit      Endocrine   Diabetic polyneuropathy associated with type 2 diabetes mellitus ([place])    Other Visit Diagnoses    Pain due to onychomycosis of toenails of both feet    -  Primary   Osteoarthritis of both feet, unspecified osteoarthritis type       Bunion       Pes planus of both feet          -Complete examination performed -Mechanically debrided nails x10 using a sterile nipper without incident -Dispensed toe spacers to use at left foot and advised patient to continue with Keflex as prescribed by PCP and at no additional charge mechanically debrided any reactive keratosis at the left medial second toe using a sterile 15 blade without incident -Diabetic shoes were dispensed with patient with proper wear instructions provided -Patient to return to office for in 3 months or sooner if condition worsens.  Advised patient if her pain is flaring up again on her right foot and ankle at next visit we can discuss a reinjection however at this time we will hold off since symptoms are well controlled.  Landis Martins, DPM

## 2019-09-17 DIAGNOSIS — G894 Chronic pain syndrome: Secondary | ICD-10-CM | POA: Diagnosis not present

## 2019-09-17 DIAGNOSIS — M7918 Myalgia, other site: Secondary | ICD-10-CM | POA: Diagnosis not present

## 2019-09-17 DIAGNOSIS — Z79899 Other long term (current) drug therapy: Secondary | ICD-10-CM | POA: Diagnosis not present

## 2019-09-17 DIAGNOSIS — M5136 Other intervertebral disc degeneration, lumbar region: Secondary | ICD-10-CM | POA: Diagnosis not present

## 2019-10-04 ENCOUNTER — Other Ambulatory Visit: Payer: Self-pay

## 2019-10-04 MED ORDER — DULOXETINE HCL 60 MG PO CPEP: 60.0000 mg | ORAL_CAPSULE | Freq: Every day | ORAL | 0 refills | Status: DC

## 2019-10-04 MED ORDER — MECLIZINE HCL 25 MG PO TABS: 25.0000 mg | ORAL_TABLET | Freq: Two times a day (BID) | ORAL | 1 refills | Status: DC | PRN

## 2019-10-29 ENCOUNTER — Ambulatory Visit (INDEPENDENT_AMBULATORY_CARE_PROVIDER_SITE_OTHER): Payer: Medicare HMO | Admitting: Family Medicine

## 2019-10-29 ENCOUNTER — Encounter: Payer: Self-pay | Admitting: Family Medicine

## 2019-10-29 ENCOUNTER — Other Ambulatory Visit: Payer: Self-pay

## 2019-10-29 VITALS — BP 118/78 | HR 85 | Temp 96.1°F | Ht 62.0 in | Wt 206.0 lb

## 2019-10-29 DIAGNOSIS — M797 Fibromyalgia: Secondary | ICD-10-CM

## 2019-10-29 DIAGNOSIS — E782 Mixed hyperlipidemia: Secondary | ICD-10-CM | POA: Diagnosis not present

## 2019-10-29 DIAGNOSIS — Z6837 Body mass index (BMI) 37.0-37.9, adult: Secondary | ICD-10-CM | POA: Diagnosis not present

## 2019-10-29 DIAGNOSIS — K219 Gastro-esophageal reflux disease without esophagitis: Secondary | ICD-10-CM

## 2019-10-29 DIAGNOSIS — Z23 Encounter for immunization: Secondary | ICD-10-CM | POA: Diagnosis not present

## 2019-10-29 DIAGNOSIS — I10 Essential (primary) hypertension: Secondary | ICD-10-CM

## 2019-10-29 DIAGNOSIS — M791 Myalgia, unspecified site: Secondary | ICD-10-CM

## 2019-10-29 DIAGNOSIS — E1169 Type 2 diabetes mellitus with other specified complication: Secondary | ICD-10-CM | POA: Diagnosis not present

## 2019-10-29 DIAGNOSIS — E66812 Obesity, class 2: Secondary | ICD-10-CM

## 2019-10-29 NOTE — Progress Notes (Signed)
Established Patient Office Visit  Subjective:  Patient ID: Andrea White, female    DOB: 1955-06-21  Age: 64 y.o. MRN: [id number]  CC:  Chief Complaint  Patient presents with  . Anxiety  . Hyperlipidemia  . Hypertension  . Fibromyalgia  . Diabetes    HPI Pt presents for follow up of hypertension.  She maintains her diet and exercise regimen, and follows up as directed.  Patient has been taking GOLIs (apple vinegar gummies.) No longer on any medicines.    Additionally, she presents with history of type 2 diabetes mellitus without complications.  compliance with treatment has been good; she takes her medication as directed, follows up as directed, and is keeping a glucose diary.  Current meds include an oral hypoglycemic ( Glucophage ).  The hypoglycemic episodes are has had none..  Checks sugars sporadically and has been 140s.  In regard to preventative care, she performs foot self-exams daily and her last ophthalmology exam was in 08/2018.      Pt presents with hyperlipidemia.  Current treatment includes Pravachol.  Compliance with treatment has been good; she takes her medication as directed, maintains her low cholesterol diet, follows up as directed, and maintains her exercise regimen.  She denies experiencing any hypercholesterolemia related symptoms.      In regard to the generalized anxiety disorder, her anxiety disorder was originally diagnosed many years ago.  Current treatment includes a benzodiazepine (xanax) 1/2 in bid and cymbalta.      Fibromyalgia details; the diagnosis was arrived at by the presence of tenderness at specified tender points when pressure to the site is applied.  The symptom complex has been stable and nonprogressive.  Currently on cymbalta.  Helps with fibromyalgia. Patient also has chronic back pain. She sees pain clinic for this and takes tramadol approximately twice a day. Gets tramadol from pain clinic.  Past Medical History:  Diagnosis Date  .  Allergy   . Anxiety   . Arthritis   . Bradycardia, unspecified   . Bruises easily   . Calf pain    when walking  . Fibromyalgia   . Frequent headaches   . GERD (gastroesophageal reflux disease)   . H/O bladder problems   . Hypertension   . IBS (irritable bowel syndrome)   . Localized edema   . Mild persistent asthma, uncomplicated   . Morbid (severe) obesity due to excess calories ([place])   . Muscle pain   . Myocardial infarction ([place])   . Other hypotension   . Ovarian cyst   . Slow transit constipation   . Solitary pulmonary nodule   . Swelling    feet and legs  . Type 2 diabetes mellitus with diabetic nephropathy ([place])   . Varicose veins of bilateral lower extremities with pain     Past Surgical History:  Procedure Laterality Date  . BUNIONECTOMY     left foot 5th toe  . CARDIAC CATHETERIZATION    . carpel tunnel surgery    . CESAREAN SECTION    . CHOLECYSTECTOMY    . GALLBLADDER SURGERY    . TONSILLECTOMY    . TUBAL LIGATION    . VAGINAL HYSTERECTOMY  2003   [facility]/SR    Family History  Problem Relation Age of Onset  . Heart disease Father   . Hypertension Father   . Stroke Father   . Heart attack Father   . Cerebrovascular Accident Father   . Diabetes Sister   . Kidney disease Sister   .  Migraines Daughter   . Cancer Maternal Aunt 74       breast    Social History   Socioeconomic History  . Marital status: Married    Spouse name: Not on file  . Number of children: 1  . Years of education: Not on file  . Highest education level: Not on file  Occupational History  . Occupation: Disabled due to foot problems since 1993  Tobacco Use  . Smoking status: Never Smoker  . Smokeless tobacco: Never Used  Vaping Use  . Vaping Use: Never used  Substance and Sexual Activity  . Alcohol use: No  . Drug use: No  . Sexual activity: Yes    Birth control/protection: Surgical    Comment: hyst  Other Topics Concern  . Not on file  Social History Narrative  .  Not on file   Social Determinants of Health   Financial Resource Strain:   . Difficulty of Paying Living Expenses: Not on file  Food Insecurity:   . Worried About Charity fundraiser in the Last Year: Not on file  . Ran Out of Food in the Last Year: Not on file  Transportation Needs:   . Lack of Transportation (Medical): Not on file  . Lack of Transportation (Non-Medical): Not on file  Physical Activity:   . Days of Exercise per Week: Not on file  . Minutes of Exercise per Session: Not on file  Stress:   . Feeling of Stress : Not on file  Social Connections:   . Frequency of Communication with Friends and Family: Not on file  . Frequency of Social Gatherings with Friends and Family: Not on file  . Attends Religious Services: Not on file  . Active Member of Clubs or Organizations: Not on file  . Attends Archivist Meetings: Not on file  . Marital Status: Not on file  Intimate Partner Violence:   . Fear of Current or Ex-Partner: Not on file  . Emotionally Abused: Not on file  . Physically Abused: Not on file  . Sexually Abused: Not on file    Outpatient Medications Prior to Visit  Medication Sig Dispense Refill  . albuterol (PROVENTIL HFA;VENTOLIN HFA) 108 (90 Base) MCG/ACT inhaler Inhale into the lungs every 6 (six) hours as needed for wheezing or shortness of breath.    . ALPRAZolam (XANAX) 0.5 MG tablet TAKE 1/2 TO 1 TABLET BY MOUTH EVERY 8 HOURS AS NEEDED. 30 tablet 2  . Biotin 10000 MCG TABS Take 1 tablet by mouth daily.    . diclofenac (VOLTAREN) 75 MG EC tablet Take 75 mg by mouth 2 (two) times daily.    . diclofenac Sodium (VOLTAREN) 1 % GEL     . DULoxetine (CYMBALTA) 60 MG capsule Take 1 capsule (60 mg total) by mouth daily. 90 capsule 0  . furosemide (LASIX) 20 MG tablet Take 1 tablet (20 mg total) by mouth daily as needed for edema. 90 tablet 0  . ibuprofen (ADVIL,MOTRIN) 200 MG tablet Take 200 mg by mouth every 6 (six) hours as needed.    . loratadine  (CLARITIN) 10 MG tablet Take 10 mg by mouth daily.    . meclizine (ANTIVERT) 25 MG tablet Take 1 tablet (25 mg total) by mouth 2 (two) times daily as needed for dizziness. 180 tablet 1  . metFORMIN (GLUCOPHAGE) 500 MG tablet Take 1 tablet (500 mg total) by mouth 2 (two) times daily with a meal. 180 tablet 0  . omeprazole (  PRILOSEC) 40 MG capsule Take 1 capsule (40 mg total) by mouth 2 (two) times daily. 180 capsule 0  . ONETOUCH DELICA LANCETS 09Q MISC     . ONETOUCH ULTRA test strip USE TO CHECK BLOOD SUGAR ONCE DAILY. 100 each 5  . pravastatin (PRAVACHOL) 20 MG tablet Take 1 tablet (20 mg total) by mouth daily. 90 tablet 0  . spironolactone-hydrochlorothiazide (ALDACTAZIDE) 25-25 MG tablet Take 1 tablet by mouth daily. 90 tablet 1  . traMADol (ULTRAM) 50 MG tablet Take 1 tablet (50 mg total) by mouth 3 (three) times daily as needed. 90 tablet 1  . cephALEXin (KEFLEX) 500 MG capsule Take 1 capsule (500 mg total) by mouth 2 (two) times daily. 14 capsule 0   No facility-administered medications prior to visit.    Allergies  Allergen Reactions  . Morphine And Related Other (See Comments)    Woozy, "out of body experience"  . Celebrex [Celecoxib] Other (See Comments)    Patient does not remember intolerance  . Gabapentin Other (See Comments)    Confusion  . Lisinopril Cough  . Guaifenesin & Derivatives Other (See Comments)    Patient cannot remember specific issue, just made her feel "weird"  . Lipitor [Atorvastatin] Rash  . Sulfa Antibiotics Rash  . Valium [Diazepam] Other (See Comments)    Lightheaded, dizziness     ROS Review of Systems  Constitutional: Negative for chills, fatigue and fever.  HENT: Negative for congestion, ear pain, rhinorrhea and sore throat.   Respiratory: Negative for cough and shortness of breath.   Cardiovascular: Negative for chest pain.  Gastrointestinal: Negative for abdominal pain, constipation, diarrhea, nausea and vomiting.  Endocrine: Negative for  polydipsia, polyphagia and polyuria.  Genitourinary: Negative for dysuria and urgency.  Musculoskeletal: Positive for arthralgias and back pain. Negative for myalgias.       Voltaren gel and voltaren pills.  Neurological: Negative for dizziness, weakness, light-headedness and headaches.  Psychiatric/Behavioral: Negative for dysphoric mood. The patient is not nervous/anxious.    Diabetic Foot Exam - Simple   Simple Foot Form Visual Inspection See comments: Yes Sensation Testing Intact to touch and monofilament testing bilaterally: Yes Pulse Check Posterior Tibialis and Dorsalis pulse intact bilaterally: Yes Comments Flat feet. Callus on second left toe.  Bunions (small) Bl.       Objective:    Physical Exam Vitals reviewed.  Constitutional:      Appearance: Normal appearance. She is well-developed.  Neck:     Vascular: No carotid bruit.  Cardiovascular:     Rate and Rhythm: Normal rate and regular rhythm.     Pulses: Normal pulses.     Heart sounds: Normal heart sounds.  Pulmonary:     Effort: Pulmonary effort is normal.     Breath sounds: Normal breath sounds.  Abdominal:     General: Bowel sounds are normal.     Palpations: Abdomen is soft.     Tenderness: There is no abdominal tenderness.  Musculoskeletal:     Right lower leg: No edema.     Left lower leg: No edema.     Comments: Mildly positive for SLR BL.  Neurological:     Mental Status: She is alert and oriented to person, place, and time.  Psychiatric:        Mood and Affect: Mood normal.        Behavior: Behavior normal.     BP 118/78   Pulse 85   Temp (!) 96.1 F (35.6 C)  Ht 5\' 2"  (1.575 m)   Wt 206 lb (93.4 kg)   SpO2 99%   BMI 37.68 kg/m  Wt Readings from Last 3 Encounters:  10/29/19 206 lb (93.4 kg)  09/02/19 204 lb (92.5 kg)  07/09/19 204 lb 9.6 oz (92.8 kg)     Health Maintenance Due  Topic Date Due  . Hepatitis C Screening  Never done  . HIV Screening  Never done  . PAP  SMEAR-Modifier  01/22/2016  . OPHTHALMOLOGY EXAM  09/18/2019      Assessment & Plan:  1. Essential hypertension Well controlled without medicines. Continue to work on eating a healthy diet and exercise.  Labs drawn today.  - Comprehensive metabolic panel - CBC with Differential/Platelet  2. Mixed hyperlipidemia Well controlled.  No changes to medicines.  Continue to work on eating a healthy diet and exercise.  Labs drawn today.  - Lipid panel  3. Mixed diabetic hyperlipidemia associated with type 2 diabetes mellitus ([facility]) Control: great Recommend check sugars fasting daily. Recommend check feet daily. Recommend annual eye exams. Medicines: no changes. Continue to work on eating a healthy diet and exercise.  Labs drawn today.   - Hemoglobin A1c  4. Encounter for immunization - Flu Vaccine MDCK QUAD PF  5. Myalgia - Magnesium - Phosphorus - CK  6. Primary fibromyalgia syndrome The current medical regimen is effective;  continue present plan and medications.  7. Morbid obesity ([place]) Recommend continue to work on eating healthy diet and exercise.  8. Class 2 severe obesity due to excess calories with serious comorbidity and body mass index (BMI) of 37.0 to 37.9 in adult [facility]) Recommend continue to work on eating healthy diet and exercise.   Follow-up: No follow-ups on file.    [name], MD

## 2019-10-30 LAB — CBC WITH DIFFERENTIAL/PLATELET
Basophils Absolute: 0.1 10*3/uL (ref 0.0–0.2)
Basos: 1 %
EOS (ABSOLUTE): 0.1 10*3/uL (ref 0.0–0.4)
Eos: 1 %
Hematocrit: 43.4 % (ref 34.0–46.6)
Hemoglobin: 14.5 g/dL (ref 11.1–15.9)
Immature Grans (Abs): 0.2 10*3/uL — ABNORMAL HIGH (ref 0.0–0.1)
Immature Granulocytes: 2 %
Lymphocytes Absolute: 4 10*3/uL — ABNORMAL HIGH (ref 0.7–3.1)
Lymphs: 32 %
MCH: 30.7 pg (ref 26.6–33.0)
MCHC: 33.4 g/dL (ref 31.5–35.7)
MCV: 92 fL (ref 79–97)
Monocytes Absolute: 1 10*3/uL — ABNORMAL HIGH (ref 0.1–0.9)
Monocytes: 8 %
Neutrophils Absolute: 7.2 10*3/uL — ABNORMAL HIGH (ref 1.4–7.0)
Neutrophils: 56 %
Platelets: 497 10*3/uL — ABNORMAL HIGH (ref 150–450)
RBC: 4.73 x10E6/uL (ref 3.77–5.28)
RDW: 13.8 % (ref 11.7–15.4)
WBC: 12.6 10*3/uL — ABNORMAL HIGH (ref 3.4–10.8)

## 2019-10-30 LAB — COMPREHENSIVE METABOLIC PANEL
ALT: 17 IU/L (ref 0–32)
AST: 15 IU/L (ref 0–40)
Albumin/Globulin Ratio: 1.8 (ref 1.2–2.2)
Albumin: 4.2 g/dL (ref 3.8–4.8)
Alkaline Phosphatase: 87 IU/L (ref 44–121)
BUN/Creatinine Ratio: 15 (ref 12–28)
BUN: 13 mg/dL (ref 8–27)
Bilirubin Total: 0.7 mg/dL (ref 0.0–1.2)
CO2: 26 mmol/L (ref 20–29)
Calcium: 9.8 mg/dL (ref 8.7–10.3)
Chloride: 94 mmol/L — ABNORMAL LOW (ref 96–106)
Creatinine, Ser: 0.86 mg/dL (ref 0.57–1.00)
GFR calc Af Amer: 84 mL/min/{1.73_m2} (ref >59)
GFR calc non Af Amer: 73 mL/min/{1.73_m2} (ref >59)
Globulin, Total: 2.4 g/dL (ref 1.5–4.5)
Glucose: 107 mg/dL — ABNORMAL HIGH (ref 65–99)
Potassium: 4.2 mmol/L (ref 3.5–5.2)
Sodium: 137 mmol/L (ref 134–144)
Total Protein: 6.6 g/dL (ref 6.0–8.5)

## 2019-10-30 LAB — LIPID PANEL
Chol/HDL Ratio: 3.4 ratio (ref 0.0–4.4)
Cholesterol, Total: 171 mg/dL (ref 100–199)
HDL: 51 mg/dL (ref >39)
LDL Chol Calc (NIH): 91 mg/dL (ref 0–99)
Triglycerides: 168 mg/dL — ABNORMAL HIGH (ref 0–149)
VLDL Cholesterol Cal: 29 mg/dL (ref 5–40)

## 2019-10-30 LAB — MAGNESIUM: Magnesium: 1.9 mg/dL (ref 1.6–2.3)

## 2019-10-30 LAB — CARDIOVASCULAR RISK ASSESSMENT

## 2019-10-30 LAB — HEMOGLOBIN A1C
Est. average glucose Bld gHb Est-mCnc: 140 mg/dL
Hgb A1c MFr Bld: 6.5 % — ABNORMAL HIGH (ref 4.8–5.6)

## 2019-10-30 LAB — TSH: TSH: 1.47 u[IU]/mL (ref 0.450–4.500)

## 2019-10-30 LAB — CK: Total CK: 132 U/L (ref 32–182)

## 2019-10-30 LAB — PHOSPHORUS: Phosphorus: 3.4 mg/dL (ref 3.0–4.3)

## 2019-11-01 ENCOUNTER — Other Ambulatory Visit: Payer: Self-pay

## 2019-11-01 MED ORDER — PRAVASTATIN SODIUM 20 MG PO TABS
20.0000 mg | ORAL_TABLET | Freq: Every day | ORAL | 0 refills | Status: DC
Start: 2019-11-01 — End: 2019-12-08

## 2019-11-04 ENCOUNTER — Other Ambulatory Visit: Payer: Self-pay | Admitting: Family Medicine

## 2019-11-05 ENCOUNTER — Other Ambulatory Visit: Payer: Self-pay

## 2019-11-05 MED ORDER — FUROSEMIDE 20 MG PO TABS: 20.0000 mg | ORAL_TABLET | Freq: Every day | ORAL | 1 refills | Status: DC | PRN

## 2019-11-05 MED ORDER — OMEPRAZOLE 40 MG PO CPDR
40.0000 mg | DELAYED_RELEASE_CAPSULE | Freq: Two times a day (BID) | ORAL | 1 refills | Status: DC
Start: 2019-11-05 — End: 2019-12-03

## 2019-11-05 MED ORDER — METFORMIN HCL 500 MG PO TABS
500.0000 mg | ORAL_TABLET | Freq: Two times a day (BID) | ORAL | 1 refills | Status: DC
Start: 2019-11-05 — End: 2019-11-28

## 2019-11-05 NOTE — Telephone Encounter (Signed)
Re-sent via fax since e-scribe is down currently.

## 2019-11-22 ENCOUNTER — Other Ambulatory Visit: Payer: Self-pay

## 2019-11-22 ENCOUNTER — Encounter: Payer: Self-pay | Admitting: Podiatry

## 2019-11-22 ENCOUNTER — Ambulatory Visit: Payer: Medicare HMO | Admitting: Podiatry

## 2019-11-22 DIAGNOSIS — S90822A Blister (nonthermal), left foot, initial encounter: Secondary | ICD-10-CM

## 2019-11-22 DIAGNOSIS — L089 Local infection of the skin and subcutaneous tissue, unspecified: Secondary | ICD-10-CM

## 2019-11-22 MED ORDER — CEPHALEXIN 500 MG PO CAPS: 500.0000 mg | ORAL_CAPSULE | Freq: Two times a day (BID) | ORAL | 0 refills | Status: DC

## 2019-11-22 NOTE — Progress Notes (Signed)
  Subjective:  Patient ID: Andrea White, female    DOB: 03-11-1955,  MRN: [id number]  Chief Complaint  Patient presents with  . Foot Problem    the left big toe has a blister on it and came up saturday and was coming from wearing shoes    64 y.o. female presents for wound care. Hx confirmed with patient. States it hurts very much. Objective:  Physical Exam: Left hallux blister medial IPJ with cloudy fluid upon debridement. Slight periwound erythema, but no warmth. Hallux valgus deformity, hammertoes Assessment:   1. Infected blister of left foot, initial encounter      Plan:  Patient was evaluated and treated and all questions answered.  Interdigital Blister left hallux -Lesion lanced, cloudy yellow fluid, deroofed and povidone ointment applied -Discussed importance of interdigital padding. -Rx keflex for slight erythema -Ok to soak in water and epsom salt to dry it out.  No follow-ups on file.

## 2019-11-26 ENCOUNTER — Other Ambulatory Visit: Payer: Self-pay | Admitting: Family Medicine

## 2019-11-29 ENCOUNTER — Other Ambulatory Visit: Payer: Self-pay | Admitting: Physician Assistant

## 2019-12-02 ENCOUNTER — Other Ambulatory Visit: Payer: Self-pay | Admitting: Family Medicine

## 2019-12-08 ENCOUNTER — Other Ambulatory Visit: Payer: Self-pay

## 2019-12-09 ENCOUNTER — Other Ambulatory Visit: Payer: Self-pay

## 2019-12-09 ENCOUNTER — Ambulatory Visit (INDEPENDENT_AMBULATORY_CARE_PROVIDER_SITE_OTHER): Payer: Medicare HMO | Admitting: Family Medicine

## 2019-12-09 ENCOUNTER — Encounter: Payer: Self-pay | Admitting: Family Medicine

## 2019-12-09 VITALS — BP 124/68 | HR 88 | Temp 97.2°F | Ht 62.0 in | Wt 207.8 lb

## 2019-12-09 DIAGNOSIS — N3001 Acute cystitis with hematuria: Secondary | ICD-10-CM | POA: Diagnosis not present

## 2019-12-09 LAB — POCT URINALYSIS DIPSTICK
Bilirubin, UA: NEGATIVE
Glucose, UA: NEGATIVE
Ketones, UA: NEGATIVE
Nitrite, UA: NEGATIVE
Protein, UA: POSITIVE — AB
Spec Grav, UA: 1.015 (ref 1.010–1.025)
Urobilinogen, UA: 0.2 E.U./dL
pH, UA: 6.5 (ref 5.0–8.0)

## 2019-12-09 MED ORDER — NITROFURANTOIN MONOHYD MACRO 100 MG PO CAPS: 100.0000 mg | ORAL_CAPSULE | Freq: Two times a day (BID) | ORAL | 0 refills | Status: DC

## 2019-12-09 MED ORDER — PRAVASTATIN SODIUM 20 MG PO TABS: 20.0000 mg | ORAL_TABLET | Freq: Every day | ORAL | 0 refills | Status: DC

## 2019-12-09 NOTE — Patient Instructions (Signed)
Nitrofurantoin 100 mg one twice a day x 7 day.

## 2019-12-09 NOTE — Progress Notes (Signed)
Acute Office Visit  Subjective:    Patient ID: Andrea White, female    DOB: [birth year], 64 y.o.   MRN: [id number]  Chief Complaint  Patient presents with  . Urinary Tract Infection    HPI Patient is in today for bladder infection. Difficulty urination, urgency, and vaginal bleeding. Symptoms started 4 days ago.  Denies vaginal discharge. Denies blood in stool or urine. Pt has had a hysterectomy. Had bowel incontinence in the parking lot. She had to go home and change her clothes and come back. This has not happened previously. Denies diarrhea.   Past Medical History:  Diagnosis Date  . Allergy   . Anxiety   . Arthritis   . Bradycardia, unspecified   . Bruises easily   . Calf pain    when walking  . Fibromyalgia   . Frequent headaches   . GERD (gastroesophageal reflux disease)   . H/O bladder problems   . Hypertension   . IBS (irritable bowel syndrome)   . Localized edema   . Mild persistent asthma, uncomplicated   . Morbid (severe) obesity due to excess calories ([place])   . Muscle pain   . Myocardial infarction ([place])   . Other hypotension   . Ovarian cyst   . Slow transit constipation   . Solitary pulmonary nodule   . Swelling    feet and legs  . Type 2 diabetes mellitus with diabetic nephropathy ([place])   . Varicose veins of bilateral lower extremities with pain     Past Surgical History:  Procedure Laterality Date  . BUNIONECTOMY     left foot 5th toe  . CARDIAC CATHETERIZATION    . carpel tunnel surgery    . CESAREAN SECTION    . CHOLECYSTECTOMY    . GALLBLADDER SURGERY    . TONSILLECTOMY    . TUBAL LIGATION    . VAGINAL HYSTERECTOMY  2003   LAVH/SR    Family History  Problem Relation Age of Onset  . Heart disease Father   . Hypertension Father   . Stroke Father   . Heart attack Father   . Cerebrovascular Accident Father   . Diabetes Sister   . Kidney disease Sister   . Migraines Daughter   . Cancer Maternal Aunt 74       breast    Social  History   Socioeconomic History  . Marital status: Married    Spouse name: Not on file  . Number of children: 1  . Years of education: Not on file  . Highest education level: Not on file  Occupational History  . Occupation: Disabled due to foot problems since 1993  Tobacco Use  . Smoking status: Never Smoker  . Smokeless tobacco: Never Used  Vaping Use  . Vaping Use: Never used  Substance and Sexual Activity  . Alcohol use: No  . Drug use: No  . Sexual activity: Yes    Birth control/protection: Surgical    Comment: hyst  Other Topics Concern  . Not on file  Social History Narrative  . Not on file   Social Determinants of Health   Financial Resource Strain:   . Difficulty of Paying Living Expenses: Not on file  Food Insecurity:   . Worried About Charity fundraiser in the Last Year: Not on file  . Ran Out of Food in the Last Year: Not on file  Transportation Needs:   . Lack of Transportation (Medical): Not on file  . Lack  of Transportation (Non-Medical): Not on file  Physical Activity:   . Days of Exercise per Week: Not on file  . Minutes of Exercise per Session: Not on file  Stress:   . Feeling of Stress : Not on file  Social Connections:   . Frequency of Communication with Friends and Family: Not on file  . Frequency of Social Gatherings with Friends and Family: Not on file  . Attends Religious Services: Not on file  . Active Member of Clubs or Organizations: Not on file  . Attends Archivist Meetings: Not on file  . Marital Status: Not on file  Intimate Partner Violence:   . Fear of Current or Ex-Partner: Not on file  . Emotionally Abused: Not on file  . Physically Abused: Not on file  . Sexually Abused: Not on file    Outpatient Medications Prior to Visit  Medication Sig Dispense Refill  . albuterol (PROVENTIL HFA;VENTOLIN HFA) 108 (90 Base) MCG/ACT inhaler Inhale into the lungs every 6 (six) hours as needed for wheezing or shortness of breath.     . ALPRAZolam (XANAX) 0.5 MG tablet TAKE 1/2 TO 1 TABLET BY MOUTH EVERY 8 HOURS AS NEEDED. 30 tablet 2  . Biotin 10000 MCG TABS Take 1 tablet by mouth daily.    . cephALEXin (KEFLEX) 500 MG capsule Take 1 capsule (500 mg total) by mouth 2 (two) times daily. 14 capsule 0  . diclofenac (VOLTAREN) 75 MG EC tablet Take 75 mg by mouth 2 (two) times daily.    . diclofenac Sodium (VOLTAREN) 1 % GEL     . DULoxetine (CYMBALTA) 60 MG capsule TAKE 1 CAPSULE EVERY DAY 90 capsule 1  . furosemide (LASIX) 20 MG tablet TAKE 1 TABLET EVERY DAY AS NEEDED FOR EDEMA. 90 tablet 1  . loratadine (CLARITIN) 10 MG tablet Take 10 mg by mouth daily.    . meclizine (ANTIVERT) 25 MG tablet Take 1 tablet (25 mg total) by mouth 2 (two) times daily as needed for dizziness. 180 tablet 1  . metFORMIN (GLUCOPHAGE) 500 MG tablet TAKE 1 TABLET TWICE DAILY WITH MEALS 180 tablet 1  . omeprazole (PRILOSEC) 40 MG capsule TAKE 1 CAPSULE TWICE DAILY 180 capsule 1  . ONETOUCH DELICA LANCETS 93A MISC     . ONETOUCH ULTRA test strip USE TO CHECK BLOOD SUGAR ONCE DAILY. 100 each 5  . pravastatin (PRAVACHOL) 20 MG tablet Take 1 tablet (20 mg total) by mouth daily. 90 tablet 0  . spironolactone-hydrochlorothiazide (ALDACTAZIDE) 25-25 MG tablet Take 1 tablet by mouth daily. 90 tablet 1  . traMADol (ULTRAM) 50 MG tablet Take 1 tablet (50 mg total) by mouth 3 (three) times daily as needed. 90 tablet 1   No facility-administered medications prior to visit.    Allergies  Allergen Reactions  . Morphine And Related Other (See Comments)    Woozy, "out of body experience"  . Celebrex [Celecoxib] Other (See Comments)    Patient does not remember intolerance  . Gabapentin Other (See Comments)    Confusion  . Lisinopril Cough  . Guaifenesin & Derivatives Other (See Comments)    Patient cannot remember specific issue, just made her feel "weird"  . Lipitor [Atorvastatin] Rash  . Sulfa Antibiotics Rash  . Valium [Diazepam] Other (See Comments)     Lightheaded, dizziness     Review of Systems  Constitutional: Negative for chills, fatigue and fever.  HENT: Negative for congestion, ear pain and sore throat.   Respiratory: Negative for  cough and shortness of breath.   Cardiovascular: Negative for chest pain.  Gastrointestinal: Negative for abdominal pain, constipation, diarrhea, nausea and vomiting.  Genitourinary: Positive for difficulty urinating, urgency and vaginal bleeding. Negative for dysuria, frequency and vaginal discharge.  Musculoskeletal: Negative for arthralgias and myalgias.  Skin: Negative for rash.  Neurological: Negative for headaches.  Psychiatric/Behavioral: Negative for dysphoric mood. The patient is not nervous/anxious.        Objective:    Physical Exam Constitutional:      Appearance: Normal appearance. She is normal weight.  Neck:     Vascular: No carotid bruit.  Cardiovascular:     Rate and Rhythm: Normal rate and regular rhythm.     Pulses: Normal pulses.     Heart sounds: Normal heart sounds.  Pulmonary:     Effort: Pulmonary effort is normal.     Breath sounds: Normal breath sounds.  Abdominal:     General: Bowel sounds are normal.     Tenderness: There is no abdominal tenderness. There is no right CVA tenderness or left CVA tenderness.  Genitourinary:    General: Normal vulva.     Vagina: No vaginal discharge.     Comments: No vaginal bleeding. Cervix absent.  Neurological:     Mental Status: She is alert and oriented to person, place, and time.  Psychiatric:        Mood and Affect: Mood normal.        Behavior: Behavior normal.     BP 124/68   Pulse 88   Temp (!) 97.2 F (36.2 C)   Ht 5\' 2"  (1.575 m)   Wt 207 lb 12.8 oz (94.3 kg)   SpO2 100%   BMI 38.01 kg/m  Wt Readings from Last 3 Encounters:  12/09/19 207 lb 12.8 oz (94.3 kg)  10/29/19 206 lb (93.4 kg)  09/02/19 204 lb (92.5 kg)    Health Maintenance Due  Topic Date Due  . Hepatitis C Screening  Never done  . HIV  Screening  Never done  . PAP SMEAR-Modifier  01/22/2016  . OPHTHALMOLOGY EXAM  09/18/2019  . FOOT EXAM  12/10/2019    There are no preventive care reminders to display for this patient.   Lab Results  Component Value Date   TSH 1.470 10/29/2019   Lab Results  Component Value Date   WBC 12.6 (H) 10/29/2019   HGB 14.5 10/29/2019   HCT 43.4 10/29/2019   MCV 92 10/29/2019   PLT 497 (H) 10/29/2019   Lab Results  Component Value Date   NA 137 10/29/2019   K 4.2 10/29/2019   CO2 26 10/29/2019   GLUCOSE 107 (H) 10/29/2019   BUN 13 10/29/2019   CREATININE 0.86 10/29/2019   BILITOT 0.7 10/29/2019   ALKPHOS 87 10/29/2019   AST 15 10/29/2019   ALT 17 10/29/2019   PROT 6.6 10/29/2019   ALBUMIN 4.2 10/29/2019   CALCIUM 9.8 10/29/2019   Lab Results  Component Value Date   CHOL 171 10/29/2019   Lab Results  Component Value Date   HDL 51 10/29/2019   Lab Results  Component Value Date   LDLCALC 91 10/29/2019   Lab Results  Component Value Date   TRIG 168 (H) 10/29/2019   Lab Results  Component Value Date   CHOLHDL 3.4 10/29/2019   Lab Results  Component Value Date   HGBA1C 6.5 (H) 10/29/2019       Assessment & Plan:  1. Acute cystitis with hematuria - Urine  Culture - POCT urinalysis dipstick - nitrofurantoin, macrocrystal-monohydrate, (MACROBID) 100 MG capsule; Take 1 capsule (100 mg total) by mouth 2 (two) times daily.  Dispense: 14 capsule; Refill: 0 I found no evidence of vaginal bleeding.  Patient has had a hysterectomy so this would be odd.  There is no evidence of irritation in the vagina.   Meds ordered this encounter  Medications  . nitrofurantoin, macrocrystal-monohydrate, (MACROBID) 100 MG capsule    Sig: Take 1 capsule (100 mg total) by mouth 2 (two) times daily.    Dispense:  14 capsule    Refill:  0    Orders Placed This Encounter  Procedures  . Urine Culture  . POCT urinalysis dipstick   Follow-up: No follow-ups on file.  An After Visit  Summary was printed and given to the patient.  [name], MD [facility] [phone]

## 2019-12-10 ENCOUNTER — Encounter: Payer: Self-pay | Admitting: Family Medicine

## 2019-12-13 ENCOUNTER — Other Ambulatory Visit: Payer: Self-pay | Admitting: Family Medicine

## 2019-12-13 ENCOUNTER — Other Ambulatory Visit: Payer: Self-pay

## 2019-12-13 MED ORDER — LORATADINE 10 MG PO TABS
10.0000 mg | ORAL_TABLET | Freq: Every day | ORAL | 3 refills | Status: DC
Start: 2019-12-13 — End: 2019-12-21

## 2019-12-13 MED ORDER — TRUE METRIX BLOOD GLUCOSE TEST VI STRP: ORAL_STRIP | 2 refills | Status: DC

## 2019-12-13 MED ORDER — TRUE METRIX AIR GLUCOSE METER W/DEVICE KIT: PACK | 0 refills | Status: DC

## 2019-12-13 MED ORDER — TRUEPLUS LANCETS 33G MISC: 2 refills | Status: DC

## 2019-12-15 ENCOUNTER — Encounter: Payer: Self-pay | Admitting: Sports Medicine

## 2019-12-15 ENCOUNTER — Ambulatory Visit: Payer: Medicare HMO | Admitting: Sports Medicine

## 2019-12-15 ENCOUNTER — Other Ambulatory Visit: Payer: Self-pay

## 2019-12-15 DIAGNOSIS — B351 Tinea unguium: Secondary | ICD-10-CM | POA: Diagnosis not present

## 2019-12-15 DIAGNOSIS — E1142 Type 2 diabetes mellitus with diabetic polyneuropathy: Secondary | ICD-10-CM

## 2019-12-15 DIAGNOSIS — M19071 Primary osteoarthritis, right ankle and foot: Secondary | ICD-10-CM

## 2019-12-15 DIAGNOSIS — M79674 Pain in right toe(s): Secondary | ICD-10-CM | POA: Diagnosis not present

## 2019-12-15 DIAGNOSIS — M21619 Bunion of unspecified foot: Secondary | ICD-10-CM

## 2019-12-15 DIAGNOSIS — M2142 Flat foot [pes planus] (acquired), left foot: Secondary | ICD-10-CM

## 2019-12-15 DIAGNOSIS — M79675 Pain in left toe(s): Secondary | ICD-10-CM | POA: Diagnosis not present

## 2019-12-15 DIAGNOSIS — M19072 Primary osteoarthritis, left ankle and foot: Secondary | ICD-10-CM

## 2019-12-15 DIAGNOSIS — M2141 Flat foot [pes planus] (acquired), right foot: Secondary | ICD-10-CM

## 2019-12-15 LAB — URINE CULTURE

## 2019-12-15 NOTE — Progress Notes (Signed)
Subjective: Andrea White is a 64 y.o. female patient who returns to office for diabetic nail care.  Patient reports that last visit she had an infected blister that has now resolved has completed antibiotic and has been keeping great toe on the left foot wrapped with a Band-Aid and states that it is doing much better. Last visit to PCP last week Fasting blood sugar not recorded A1c unknown  Patient Active Problem List   Diagnosis Date Noted  . Diabetic polyneuropathy associated with type 2 diabetes mellitus ([place]) 04/30/2019  . Essential hypertension 04/30/2019  . Mixed hyperlipidemia 04/30/2019  . Mixed diabetic hyperlipidemia associated with type 2 diabetes mellitus ([place]) 04/30/2019  . Major depressive disorder, single episode, mild ([place]) 04/30/2019  . Myocardial infarction ([place]) 10/31/2017  . Primary fibromyalgia syndrome 10/31/2017  . Gastroesophageal reflux disease 03/15/2015  . Asthma 03/15/2015  . Obesity 03/15/2015  . Herniated disc 06/10/2011  . History of heart attack 06/10/2011  . Cyst of ovary     Current Outpatient Medications on File Prior to Visit  Medication Sig Dispense Refill  . ALPRAZolam (XANAX) 0.5 MG tablet TAKE 1/2 TO 1 TABLET BY MOUTH EVERY 8 HOURS AS NEEDED. 30 tablet 2  . Biotin 10000 MCG TABS Take 1 tablet by mouth daily.    . Blood Glucose Monitoring Suppl (TRUE METRIX AIR GLUCOSE METER) w/Device KIT E11.69 Use meter to check FBS as directed 1 kit 0  . cephALEXin (KEFLEX) 500 MG capsule Take 1 capsule (500 mg total) by mouth 2 (two) times daily. 14 capsule 0  . diclofenac (VOLTAREN) 75 MG EC tablet Take 75 mg by mouth 2 (two) times daily.    . diclofenac Sodium (VOLTAREN) 1 % GEL     . DULoxetine (CYMBALTA) 60 MG capsule TAKE 1 CAPSULE EVERY DAY 90 capsule 1  . furosemide (LASIX) 20 MG tablet TAKE 1 TABLET EVERY DAY AS NEEDED FOR EDEMA. 90 tablet 1  . glucose blood (TRUE METRIX BLOOD GLUCOSE TEST) test strip Use as instructed 100 each 2  . loratadine  (CLARITIN) 10 MG tablet Take 1 tablet (10 mg total) by mouth daily. 90 tablet 3  . meclizine (ANTIVERT) 25 MG tablet Take 1 tablet (25 mg total) by mouth 2 (two) times daily as needed for dizziness. 180 tablet 1  . metFORMIN (GLUCOPHAGE) 500 MG tablet TAKE 1 TABLET TWICE DAILY WITH MEALS 180 tablet 1  . nitrofurantoin, macrocrystal-monohydrate, (MACROBID) 100 MG capsule Take 1 capsule (100 mg total) by mouth 2 (two) times daily. 14 capsule 0  . omeprazole (PRILOSEC) 40 MG capsule TAKE 1 CAPSULE TWICE DAILY 180 capsule 1  . pravastatin (PRAVACHOL) 20 MG tablet Take 1 tablet (20 mg total) by mouth daily. 90 tablet 0  . PROVENTIL HFA 108 (90 Base) MCG/ACT inhaler INHALE 2 PUFFS BY MOUTH FOUR TIMES A DAY AS NEEDED FOR WHEEZING 1 each 2  . spironolactone-hydrochlorothiazide (ALDACTAZIDE) 25-25 MG tablet Take 1 tablet by mouth daily. 90 tablet 1  . traMADol (ULTRAM) 50 MG tablet Take 1 tablet (50 mg total) by mouth 3 (three) times daily as needed. 90 tablet 1  . TRUEplus Lancets 33G MISC E11.69 use new lancet each time when checking FBS 100 each 2   No current facility-administered medications on file prior to visit.    Allergies  Allergen Reactions  . Morphine And Related Other (See Comments)    Woozy, "out of body experience"  . Celebrex [Celecoxib] Other (See Comments)    Patient does not remember  intolerance  . Gabapentin Other (See Comments)    Confusion  . Lisinopril Cough  . Guaifenesin & Derivatives Other (See Comments)    Patient cannot remember specific issue, just made her feel "weird"  . Lipitor [Atorvastatin] Rash  . Sulfa Antibiotics Rash  . Valium [Diazepam] Other (See Comments)    Lightheaded, dizziness     Objective:  General: Alert and oriented x3 in no acute distress  Dermatology: Nails x10 elongated thickened dystrophic consistent with onychomycosis.  Previous infected blister now resolved at left hallux.  No acute signs of infection.  Vascular: Dorsalis Pedis and  Posterior Tibial pedal pulses palpable, Capillary Fill Time 5 seconds,(+) pedal hair growth bilateral, no edema bilateral lower extremities, Temperature gradient within normal limits.  Neurology: Johney [place] sensation intact via light touch bilateral.  Musculoskeletal: + pes planus foot type, bunion and hammertoe deformity that is currently asymptomatic.   Assessment and Plan: Problem List Items Addressed This Visit      Endocrine   Diabetic polyneuropathy associated with type 2 diabetes mellitus ([place])    Other Visit Diagnoses    Pain due to onychomycosis of toenails of both feet    -  Primary   Osteoarthritis of both feet, unspecified osteoarthritis type       Bunion       Pes planus of both feet          -Complete examination performed -Mechanically debrided nails x10 using a sterile nipper without incident -Dispensed toe spacers to use to prevent rubbing of toes that cause issues with blister recurrence -Patient to return to office for in 3 months for nail care or sooner if condition worsens.  Landis Martins, DPM

## 2019-12-20 ENCOUNTER — Other Ambulatory Visit: Payer: Self-pay

## 2019-12-20 MED ORDER — DULOXETINE HCL 60 MG PO CPEP: 60.0000 mg | ORAL_CAPSULE | Freq: Every day | ORAL | 1 refills | Status: DC

## 2019-12-20 MED ORDER — TRUEPLUS LANCETS 33G MISC: 2 refills | Status: DC

## 2019-12-21 ENCOUNTER — Other Ambulatory Visit: Payer: Self-pay

## 2019-12-21 MED ORDER — LORATADINE 10 MG PO TABS
10.0000 mg | ORAL_TABLET | Freq: Every day | ORAL | 3 refills | Status: DC
Start: 2019-12-21 — End: 2020-10-04

## 2019-12-27 ENCOUNTER — Other Ambulatory Visit: Payer: Self-pay

## 2019-12-27 MED ORDER — TRUE METRIX BLOOD GLUCOSE TEST VI STRP
ORAL_STRIP | 2 refills | Status: DC
Start: 2019-12-27 — End: 2020-01-03

## 2019-12-27 MED ORDER — DULOXETINE HCL 60 MG PO CPEP
60.0000 mg | ORAL_CAPSULE | Freq: Every day | ORAL | 1 refills | Status: DC
Start: 2019-12-27 — End: 2020-08-17

## 2019-12-27 MED ORDER — SPIRONOLACTONE-HCTZ 25-25 MG PO TABS
1.0000 | ORAL_TABLET | Freq: Every day | ORAL | 1 refills | Status: DC
Start: 2019-12-27 — End: 2020-01-03

## 2019-12-27 MED ORDER — OMEPRAZOLE 40 MG PO CPDR
40.0000 mg | DELAYED_RELEASE_CAPSULE | Freq: Two times a day (BID) | ORAL | 1 refills | Status: DC
Start: 2019-12-27 — End: 2020-05-11

## 2019-12-27 MED ORDER — TRUE METRIX AIR GLUCOSE METER W/DEVICE KIT
PACK | 0 refills | Status: DC
Start: 2019-12-27 — End: 2020-02-28

## 2019-12-29 ENCOUNTER — Other Ambulatory Visit: Payer: Self-pay

## 2020-01-03 ENCOUNTER — Other Ambulatory Visit: Payer: Self-pay | Admitting: Family Medicine

## 2020-01-03 ENCOUNTER — Other Ambulatory Visit: Payer: Self-pay

## 2020-01-03 MED ORDER — TRUE METRIX BLOOD GLUCOSE TEST VI STRP
ORAL_STRIP | 2 refills | Status: DC
Start: 2020-01-03 — End: 2020-01-10

## 2020-01-03 MED ORDER — TRUEPLUS LANCETS 33G MISC: 2 refills | Status: DC

## 2020-01-04 ENCOUNTER — Other Ambulatory Visit: Payer: Self-pay

## 2020-01-04 MED ORDER — ALPRAZOLAM 0.5 MG PO TABS: ORAL_TABLET | ORAL | 2 refills | Status: DC

## 2020-01-06 ENCOUNTER — Other Ambulatory Visit: Payer: Self-pay

## 2020-01-06 ENCOUNTER — Ambulatory Visit (INDEPENDENT_AMBULATORY_CARE_PROVIDER_SITE_OTHER): Payer: Medicare HMO | Admitting: Family Medicine

## 2020-01-06 VITALS — BP 106/72 | HR 88 | Temp 97.1°F | Resp 14 | Ht 62.0 in | Wt 208.4 lb

## 2020-01-06 DIAGNOSIS — R350 Frequency of micturition: Secondary | ICD-10-CM

## 2020-01-06 LAB — POCT URINALYSIS DIPSTICK
Blood, UA: NEGATIVE
Glucose, UA: NEGATIVE
Ketones, UA: NEGATIVE
Leukocytes, UA: NEGATIVE
Nitrite, UA: NEGATIVE
Protein, UA: POSITIVE — AB
Spec Grav, UA: >=1.03 — AB (ref 1.010–1.025)
Urobilinogen, UA: 2 E.U./dL — AB
pH, UA: 6 (ref 5.0–8.0)

## 2020-01-06 MED ORDER — ALPRAZOLAM 0.5 MG PO TABS: ORAL_TABLET | ORAL | 5 refills | Status: DC

## 2020-01-06 MED ORDER — OXYBUTYNIN CHLORIDE 5 MG PO TABS: 5.0000 mg | ORAL_TABLET | Freq: Three times a day (TID) | ORAL | 0 refills | Status: DC

## 2020-01-06 MED ORDER — OXYBUTYNIN CHLORIDE ER 5 MG PO TB24: 5.0000 mg | ORAL_TABLET | Freq: Every day | ORAL | 0 refills | Status: DC

## 2020-01-06 NOTE — Progress Notes (Signed)
Acute Office Visit  Subjective:    Patient ID: Andrea White, female    DOB: May 23, 1955, 64 y.o.   MRN: [id number]  Chief Complaint  Patient presents with  . Urinary Frequency    Urinary urgency     HPI Patient is in today for urinary frequency and urgency  X 2 months. No hematuria. No dysuria. No abdominal pain or nausea. No fever or chills. No flank pain.  Past Medical History:  Diagnosis Date  . Allergy   . Anxiety   . Arthritis   . Bradycardia, unspecified   . Bruises easily   . Calf pain    when walking  . Fibromyalgia   . Frequent headaches   . GERD (gastroesophageal reflux disease)   . H/O bladder problems   . Hypertension   . IBS (irritable bowel syndrome)   . Localized edema   . Mild persistent asthma, uncomplicated   . Morbid (severe) obesity due to excess calories ([place])   . Muscle pain   . Myocardial infarction ([place])   . Other hypotension   . Ovarian cyst   . Slow transit constipation   . Solitary pulmonary nodule   . Swelling    feet and legs  . Type 2 diabetes mellitus with diabetic nephropathy ([place])   . Varicose veins of bilateral lower extremities with pain     Past Surgical History:  Procedure Laterality Date  . BUNIONECTOMY     left foot 5th toe  . CARDIAC CATHETERIZATION    . carpel tunnel surgery    . CESAREAN SECTION    . CHOLECYSTECTOMY    . GALLBLADDER SURGERY    . TONSILLECTOMY    . TUBAL LIGATION    . VAGINAL HYSTERECTOMY  2003   LAVH/SR    Family History  Problem Relation Age of Onset  . Heart disease Father   . Hypertension Father   . Stroke Father   . Heart attack Father   . Cerebrovascular Accident Father   . Diabetes Sister   . Kidney disease Sister   . Migraines Daughter   . Cancer Maternal Aunt 74       breast    Social History   Socioeconomic History  . Marital status: Married    Spouse name: Not on file  . Number of children: 1  . Years of education: Not on file  . Highest education level: Not on  file  Occupational History  . Occupation: Disabled due to foot problems since 1993  Tobacco Use  . Smoking status: Never Smoker  . Smokeless tobacco: Never Used  Vaping Use  . Vaping Use: Never used  Substance and Sexual Activity  . Alcohol use: No  . Drug use: No  . Sexual activity: Yes    Birth control/protection: Surgical    Comment: hyst  Other Topics Concern  . Not on file  Social History Narrative  . Not on file   Social Determinants of Health   Financial Resource Strain: Not on file  Food Insecurity: Not on file  Transportation Needs: Not on file  Physical Activity: Not on file  Stress: Not on file  Social Connections: Not on file  Intimate Partner Violence: Not on file    Outpatient Medications Prior to Visit  Medication Sig Dispense Refill  . Biotin 10000 MCG TABS Take 1 tablet by mouth daily.    . Blood Glucose Monitoring Suppl (TRUE METRIX AIR GLUCOSE METER) w/Device KIT E11.69 Use meter to check FBS  as directed 1 kit 0  . diclofenac (VOLTAREN) 75 MG EC tablet Take 75 mg by mouth 2 (two) times daily.    . DULoxetine (CYMBALTA) 60 MG capsule Take 1 capsule (60 mg total) by mouth daily. 90 capsule 1  . furosemide (LASIX) 20 MG tablet TAKE 1 TABLET EVERY DAY AS NEEDED FOR EDEMA. 90 tablet 1  . loratadine (CLARITIN) 10 MG tablet Take 1 tablet (10 mg total) by mouth daily. 90 tablet 3  . meclizine (ANTIVERT) 25 MG tablet Take 1 tablet (25 mg total) by mouth 2 (two) times daily as needed for dizziness. 180 tablet 1  . metFORMIN (GLUCOPHAGE) 500 MG tablet TAKE 1 TABLET TWICE DAILY WITH MEALS 180 tablet 1  . omeprazole (PRILOSEC) 40 MG capsule Take 1 capsule (40 mg total) by mouth 2 (two) times daily. 180 capsule 1  . PROVENTIL HFA 108 (90 Base) MCG/ACT inhaler INHALE 2 PUFFS BY MOUTH FOUR TIMES A DAY AS NEEDED FOR WHEEZING 1 each 2  . spironolactone-hydrochlorothiazide (ALDACTAZIDE) 25-25 MG tablet TAKE 1 TABLET EVERY DAY 90 tablet 1  . traMADol (ULTRAM) 50 MG tablet  Take 1 tablet (50 mg total) by mouth 3 (three) times daily as needed. 90 tablet 1  . TRUEplus Lancets 33G MISC E11.69 use new lancet each time when checking FBS 100 each 2  . ALPRAZolam (XANAX) 0.5 MG tablet TAKE 1/2 TO 1 TABLET BY MOUTH EVERY 8 HOURS AS NEEDED. 30 tablet 2  . cephALEXin (KEFLEX) 500 MG capsule Take 1 capsule (500 mg total) by mouth 2 (two) times daily. 14 capsule 0  . diclofenac Sodium (VOLTAREN) 1 % GEL     . glucose blood (TRUE METRIX BLOOD GLUCOSE TEST) test strip Use as instructed 100 each 2  . nitrofurantoin, macrocrystal-monohydrate, (MACROBID) 100 MG capsule Take 1 capsule (100 mg total) by mouth 2 (two) times daily. 14 capsule 0  . pravastatin (PRAVACHOL) 20 MG tablet Take 1 tablet (20 mg total) by mouth daily. 90 tablet 0   No facility-administered medications prior to visit.    Allergies  Allergen Reactions  . Morphine And Related Other (See Comments)    Woozy, "out of body experience"  . Celebrex [Celecoxib] Other (See Comments)    Patient does not remember intolerance  . Gabapentin Other (See Comments)    Confusion  . Lisinopril Cough  . Guaifenesin & Derivatives Other (See Comments)    Patient cannot remember specific issue, just made her feel "weird"  . Lipitor [Atorvastatin] Rash  . Sulfa Antibiotics Rash  . Valium [Diazepam] Other (See Comments)    Lightheaded, dizziness     Review of Systems  Constitutional: Negative for chills, fatigue and fever.  HENT: Negative for congestion, rhinorrhea and sore throat.   Respiratory: Negative for cough and shortness of breath.   Cardiovascular: Negative for chest pain.  Gastrointestinal: Negative for abdominal pain, constipation, diarrhea, nausea and vomiting.  Genitourinary: Positive for frequency and urgency. Negative for dysuria.  Musculoskeletal: Positive for back pain. Negative for myalgias.  Neurological: Negative for dizziness, weakness, light-headedness and headaches.  Psychiatric/Behavioral:  Negative for dysphoric mood. The patient is not nervous/anxious.        Objective:    Physical Exam Vitals reviewed.  Constitutional:      Appearance: Normal appearance. She is normal weight.  Cardiovascular:     Rate and Rhythm: Normal rate and regular rhythm.     Pulses: Normal pulses.     Heart sounds: Normal heart sounds.  Pulmonary:  Effort: Pulmonary effort is normal. No respiratory distress.     Breath sounds: Normal breath sounds.  Abdominal:     General: Abdomen is flat. Bowel sounds are normal.     Palpations: Abdomen is soft.     Tenderness: There is no abdominal tenderness.  Neurological:     Mental Status: She is alert and oriented to person, place, and time.  Psychiatric:        Mood and Affect: Mood normal.        Behavior: Behavior normal.     BP 106/72   Pulse 88   Temp (!) 97.1 F (36.2 C)   Resp 14   Ht $R'5\' 2"'mj$  (1.575 m)   Wt 208 lb 6.4 oz (94.5 kg)   BMI 38.12 kg/m  Wt Readings from Last 3 Encounters:  01/06/20 208 lb 6.4 oz (94.5 kg)  12/09/19 207 lb 12.8 oz (94.3 kg)  10/29/19 206 lb (93.4 kg)    Health Maintenance Due  Topic Date Due  . Hepatitis C Screening  Never done  . HIV Screening  Never done  . PAP SMEAR-Modifier  01/22/2016  . OPHTHALMOLOGY EXAM  09/18/2019  . FOOT EXAM  12/10/2019    There are no preventive care reminders to display for this patient.   Lab Results  Component Value Date   TSH 1.470 10/29/2019   Lab Results  Component Value Date   WBC 12.6 (H) 10/29/2019   HGB 14.5 10/29/2019   HCT 43.4 10/29/2019   MCV 92 10/29/2019   PLT 497 (H) 10/29/2019   Lab Results  Component Value Date   NA 137 10/29/2019   K 4.2 10/29/2019   CO2 26 10/29/2019   GLUCOSE 107 (H) 10/29/2019   BUN 13 10/29/2019   CREATININE 0.86 10/29/2019   BILITOT 0.7 10/29/2019   ALKPHOS 87 10/29/2019   AST 15 10/29/2019   ALT 17 10/29/2019   PROT 6.6 10/29/2019   ALBUMIN 4.2 10/29/2019   CALCIUM 9.8 10/29/2019   Lab Results   Component Value Date   CHOL 171 10/29/2019   Lab Results  Component Value Date   HDL 51 10/29/2019   Lab Results  Component Value Date   LDLCALC 91 10/29/2019   Lab Results  Component Value Date   TRIG 168 (H) 10/29/2019   Lab Results  Component Value Date   CHOLHDL 3.4 10/29/2019   Lab Results  Component Value Date   HGBA1C 6.5 (H) 10/29/2019       Assessment & Plan:  1. Urinary frequency - POCT urinalysis dipstick  Start on oxybutynin 5 mg one three times a day.  No infection noted.   Meds ordered this encounter  Medications  . DISCONTD: oxybutynin (DITROPAN-XL) 5 MG 24 hr tablet    Sig: Take 1 tablet (5 mg total) by mouth at bedtime.    Dispense:  30 tablet    Refill:  0  . oxybutynin (DITROPAN) 5 MG tablet    Sig: Take 1 tablet (5 mg total) by mouth 3 (three) times daily.    Dispense:  270 tablet    Refill:  0    CANCEL OXYBUTYNIN XL. SEND IR. PT WOULD LIKE [facility]  . ALPRAZolam (XANAX) 0.5 MG tablet    Sig: TAKE 1/2 TO 1 TABLET BY MOUTH EVERY 8 HOURS AS NEEDED.    Dispense:  30 tablet    Refill:  5    Orders Placed This Encounter  Procedures  . POCT urinalysis dipstick  Follow-up: No follow-ups on file.  An After Visit Summary was printed and given to the patient.  [name], MD [facility] [phone]

## 2020-01-07 ENCOUNTER — Other Ambulatory Visit: Payer: Self-pay | Admitting: Family Medicine

## 2020-01-23 ENCOUNTER — Encounter: Payer: Self-pay | Admitting: Family Medicine

## 2020-02-28 ENCOUNTER — Other Ambulatory Visit: Payer: Self-pay | Admitting: Family Medicine

## 2020-03-01 ENCOUNTER — Other Ambulatory Visit: Payer: Self-pay | Admitting: Physician Assistant

## 2020-03-07 ENCOUNTER — Encounter: Payer: Self-pay | Admitting: Family Medicine

## 2020-03-08 DIAGNOSIS — Q666 Other congenital valgus deformities of feet: Secondary | ICD-10-CM

## 2020-03-09 ENCOUNTER — Telehealth: Payer: Self-pay | Admitting: Sports Medicine

## 2020-03-09 DIAGNOSIS — M791 Myalgia, unspecified site: Secondary | ICD-10-CM | POA: Diagnosis not present

## 2020-03-09 DIAGNOSIS — G894 Chronic pain syndrome: Secondary | ICD-10-CM | POA: Diagnosis not present

## 2020-03-09 DIAGNOSIS — M5136 Other intervertebral disc degeneration, lumbar region: Secondary | ICD-10-CM | POA: Diagnosis not present

## 2020-03-09 NOTE — Telephone Encounter (Signed)
Pt left message and has questions about diabetic shoes.When can she get them and will insurance cover them.  I returned call and pt now has [employer], I explained that medicare and medicare advantage plans covers the shoes/inserts @ 80% so pt would possibly have a 20% coinsurance of between 80 to 100 dollars and that also could be more if pt has the medicare deductible at the beginning of the yr she has not met. She said she has never paid for the shoes that she has gotten and I told her I could not tell her for sure they would be 100% covered and she stated she was going to call the insurance to verify. I did schedule pt to see Andrea White 4.13.2022 after her appt in march with Andrea White.

## 2020-03-17 ENCOUNTER — Telehealth: Payer: Self-pay | Admitting: Family Medicine

## 2020-03-17 ENCOUNTER — Other Ambulatory Visit: Payer: Self-pay

## 2020-03-17 DIAGNOSIS — E1169 Type 2 diabetes mellitus with other specified complication: Secondary | ICD-10-CM

## 2020-03-17 NOTE — Progress Notes (Signed)
°  Chronic Care Management   Outreach Note  03/17/2020 Name: Andrea White MRN: [id number] DOB: [birth year]  Referred by: [name], MD Reason for referral : Chronic Care Management   An unsuccessful telephone outreach was attempted today. The patient was referred to the pharmacist for assistance with care management and care coordination.   Follow Up Plan:   Andrea White  Upstream Scheduler

## 2020-03-20 ENCOUNTER — Telehealth: Payer: Self-pay | Admitting: Family Medicine

## 2020-03-20 NOTE — Chronic Care Management (AMB) (Signed)
  Chronic Care Management   Note  03/20/2020 Name: Andrea White MRN: [id number] DOB: 09-Jan-1956  Andrea White is a 65 y.o. year old female who is a primary care patient of Cox, Kirsten, MD. I reached out to Andrea White by phone today in response to a referral sent by Ms. Newman Nickels Kielty's PCP, Cox, Kirsten, MD.   Ms. Benn was given information about Chronic Care Management services today including:  1. CCM service includes personalized support from designated clinical staff supervised by her physician, including individualized plan of care and coordination with other care providers 2. 24/7 contact phone numbers for assistance for urgent and routine care needs. 3. Service will only be billed when office clinical staff spend 20 minutes or more in a month to coordinate care. 4. Only one practitioner may furnish and bill the service in a calendar month. 5. The patient may stop CCM services at any time (effective at the end of the month) by phone call to the office staff.   Patient agreed to services and verbal consent obtained.   Follow up plan:   [place]

## 2020-03-31 ENCOUNTER — Encounter: Payer: Self-pay | Admitting: Sports Medicine

## 2020-03-31 ENCOUNTER — Ambulatory Visit: Payer: Medicare HMO | Admitting: Sports Medicine

## 2020-03-31 ENCOUNTER — Other Ambulatory Visit: Payer: Self-pay

## 2020-03-31 DIAGNOSIS — E1142 Type 2 diabetes mellitus with diabetic polyneuropathy: Secondary | ICD-10-CM | POA: Diagnosis not present

## 2020-03-31 DIAGNOSIS — M79675 Pain in left toe(s): Secondary | ICD-10-CM

## 2020-03-31 DIAGNOSIS — M2142 Flat foot [pes planus] (acquired), left foot: Secondary | ICD-10-CM

## 2020-03-31 DIAGNOSIS — M79674 Pain in right toe(s): Secondary | ICD-10-CM

## 2020-03-31 DIAGNOSIS — M21619 Bunion of unspecified foot: Secondary | ICD-10-CM

## 2020-03-31 DIAGNOSIS — B351 Tinea unguium: Secondary | ICD-10-CM | POA: Diagnosis not present

## 2020-03-31 DIAGNOSIS — M2141 Flat foot [pes planus] (acquired), right foot: Secondary | ICD-10-CM

## 2020-03-31 DIAGNOSIS — M19071 Primary osteoarthritis, right ankle and foot: Secondary | ICD-10-CM

## 2020-03-31 DIAGNOSIS — S90415A Abrasion, left lesser toe(s), initial encounter: Secondary | ICD-10-CM

## 2020-03-31 DIAGNOSIS — M19072 Primary osteoarthritis, left ankle and foot: Secondary | ICD-10-CM

## 2020-03-31 NOTE — Progress Notes (Signed)
Subjective: Andrea White is a 65 y.o. female patient who returns to office for diabetic nail care.  Patient reports that she also scraped her left second toe and slowly it is healing and doing better.  Patient reports that her blood sugars are doing good.  Patient also reports that she is interested in getting new diabetic shoes already has appointment for next month.  No other acute issues noted.  Patient Active Problem List   Diagnosis Date Noted  . Diabetic polyneuropathy associated with type 2 diabetes mellitus ([employer]) 04/30/2019  . Essential hypertension 04/30/2019  . Mixed hyperlipidemia 04/30/2019  . Mixed diabetic hyperlipidemia associated with type 2 diabetes mellitus ([place]) 04/30/2019  . Major depressive disorder, single episode, mild ([place]) 04/30/2019  . Myocardial infarction ([place]) 10/31/2017  . Primary fibromyalgia syndrome 10/31/2017  . Gastroesophageal reflux disease 03/15/2015  . Asthma 03/15/2015  . Obesity 03/15/2015  . Herniated disc 06/10/2011  . History of heart attack 06/10/2011  . Cyst of ovary     Current Outpatient Medications on File Prior to Visit  Medication Sig Dispense Refill  . ALPRAZolam (XANAX) 0.5 MG tablet TAKE 1/2 TO 1 TABLET BY MOUTH EVERY 8 HOURS AS NEEDED. 30 tablet 5  . Biotin 10000 MCG TABS Take 1 tablet by mouth daily.    . Blood Glucose Monitoring Suppl (TRUE METRIX METER) w/Device KIT USE AS DIRECTED 1 kit 0  . diclofenac (VOLTAREN) 75 MG EC tablet Take 75 mg by mouth 2 (two) times daily.    . DULoxetine (CYMBALTA) 60 MG capsule Take 1 capsule (60 mg total) by mouth daily. 90 capsule 1  . furosemide (LASIX) 20 MG tablet TAKE 1 TABLET EVERY DAY AS NEEDED FOR EDEMA. 90 tablet 1  . glucose blood (TRUE METRIX BLOOD GLUCOSE TEST) test strip USE AS DIRECTED TO CHECK FASTING BLOOD SUGAR 300 strip 2  . loratadine (CLARITIN) 10 MG tablet Take 1 tablet (10 mg total) by mouth daily. 90 tablet 3  . meclizine (ANTIVERT) 25 MG tablet TAKE 1 TABLET TWICE DAILY  AS NEEDED FOR DIZZINESS. 180 tablet 1  . metFORMIN (GLUCOPHAGE) 500 MG tablet TAKE 1 TABLET TWICE DAILY WITH MEALS 180 tablet 1  . omeprazole (PRILOSEC) 40 MG capsule Take 1 capsule (40 mg total) by mouth 2 (two) times daily. 180 capsule 1  . oxybutynin (DITROPAN) 5 MG tablet Take 1 tablet (5 mg total) by mouth 3 (three) times daily. 270 tablet 0  . PROVENTIL HFA 108 (90 Base) MCG/ACT inhaler INHALE 2 PUFFS BY MOUTH FOUR TIMES A DAY AS NEEDED FOR WHEEZING 1 each 2  . spironolactone-hydrochlorothiazide (ALDACTAZIDE) 25-25 MG tablet TAKE 1 TABLET EVERY DAY 90 tablet 1  . traMADol (ULTRAM) 50 MG tablet Take 1 tablet (50 mg total) by mouth 3 (three) times daily as needed. 90 tablet 1  . TRUEplus Lancets 33G MISC E11.69 use new lancet each time when checking FBS 100 each 2   No current facility-administered medications on file prior to visit.    Allergies  Allergen Reactions  . Morphine And Related Other (See Comments)    Woozy, "out of body experience"  . Celebrex [Celecoxib] Other (See Comments)    Patient does not remember intolerance  . Gabapentin Other (See Comments)    Confusion  . Lisinopril Cough  . Guaifenesin & Derivatives Other (See Comments)    Patient cannot remember specific issue, just made her feel "weird"  . Lipitor [Atorvastatin] Rash  . Sulfa Antibiotics Rash  . Valium [Diazepam] Other (  See Comments)    Lightheaded, dizziness     Objective:  General: Alert and oriented x3 in no acute distress  Dermatology: Nails x10 elongated thickened dystrophic consistent with onychomycosis.  Abrasion at left second toe dorsal IPJ with no surrounding signs of infection.   Vascular: Dorsalis Pedis and Posterior Tibial pedal pulses palpable, Capillary Fill Time 5 seconds,(+) pedal hair growth bilateral, no edema bilateral lower extremities, Temperature gradient within normal limits.  Neurology: Johney Maine sensation intact via light touch bilateral.  Musculoskeletal: + pes planus foot  type, bunion and hammertoe deformity that is currently asymptomatic.   Assessment and Plan: Problem List Items Addressed This Visit      Endocrine   Diabetic polyneuropathy associated with type 2 diabetes mellitus ([place]) - Primary    Other Visit Diagnoses    Pes planus of both feet       Pain due to onychomycosis of toenails of both feet       Osteoarthritis of both feet, unspecified osteoarthritis type       Bunion       Abrasion of second toe of left foot, initial encounter          -Complete examination performed -Mechanically debrided nails x10 using a sterile nipper without incident -Dispensed toe spacers to use to prevent rubbing of toes that cause issues with blister recurrence -Advised patient to also apply Neosporin and Band-Aid until healed -Patient to return to office for in 3 months for nail care or sooner if condition worsens.  Return as scheduled for diabetic shoes.  Landis Martins, DPM

## 2020-04-18 ENCOUNTER — Ambulatory Visit (INDEPENDENT_AMBULATORY_CARE_PROVIDER_SITE_OTHER): Payer: Medicare HMO | Admitting: Nurse Practitioner

## 2020-04-18 ENCOUNTER — Other Ambulatory Visit: Payer: Self-pay

## 2020-04-18 ENCOUNTER — Encounter: Payer: Self-pay | Admitting: Nurse Practitioner

## 2020-04-18 VITALS — BP 108/60 | HR 87 | Temp 97.5°F | Ht 62.0 in | Wt 208.0 lb

## 2020-04-18 DIAGNOSIS — M25551 Pain in right hip: Secondary | ICD-10-CM | POA: Diagnosis not present

## 2020-04-18 DIAGNOSIS — W19XXXA Unspecified fall, initial encounter: Secondary | ICD-10-CM

## 2020-04-18 MED ORDER — KETOROLAC TROMETHAMINE 60 MG/2ML IM SOLN
60.0000 mg | Freq: Once | INTRAMUSCULAR | Status: AC
  Administered 2020-04-18: 60 mg via INTRAMUSCULAR

## 2020-04-18 NOTE — Progress Notes (Deleted)
Acute Office Visit  Subjective:    Patient ID: Andrea White, female    DOB: 12-Jan-1956, 65 y.o.   MRN: [id number]  Chief Complaint  Patient presents with  . Back Pain    HPI Patient is in today for back pain, states she fell in her yard last Thursday (tripped over some plastic fencing) since then she has had sharp radiating back pain that goes from her lower back to her right groin and up her back. Has been taking Tramadol, using voltaren gel and using her tens unit. At night she is unable to sleep on her right side due to the discomfort.  Past Medical History:  Diagnosis Date  . Allergy   . Anxiety   . Arthritis   . Bradycardia, unspecified   . Bruises easily   . Calf pain    when walking  . Fibromyalgia   . Frequent headaches   . GERD (gastroesophageal reflux disease)   . H/O bladder problems   . Hypertension   . IBS (irritable bowel syndrome)   . Localized edema   . Mild persistent asthma, uncomplicated   . Morbid (severe) obesity due to excess calories ([place])   . Muscle pain   . Myocardial infarction ([place])   . Other hypotension   . Ovarian cyst   . Slow transit constipation   . Solitary pulmonary nodule   . Swelling    feet and legs  . Type 2 diabetes mellitus with diabetic nephropathy ([place])   . Varicose veins of bilateral lower extremities with pain     Past Surgical History:  Procedure Laterality Date  . BUNIONECTOMY     left foot 5th toe  . CARDIAC CATHETERIZATION    . carpel tunnel surgery    . CESAREAN SECTION    . CHOLECYSTECTOMY    . GALLBLADDER SURGERY    . TONSILLECTOMY    . TUBAL LIGATION    . VAGINAL HYSTERECTOMY  2003   LAVH/SR    Family History  Problem Relation Age of Onset  . Heart disease Father   . Hypertension Father   . Stroke Father   . Heart attack Father   . Cerebrovascular Accident Father   . Diabetes Sister   . Kidney disease Sister   . Migraines Daughter   . Cancer Maternal Aunt 74       breast    Social  History   Socioeconomic History  . Marital status: Married    Spouse name: Not on file  . Number of children: 1  . Years of education: Not on file  . Highest education level: Not on file  Occupational History  . Occupation: Disabled due to foot problems since 1993  Tobacco Use  . Smoking status: Never Smoker  . Smokeless tobacco: Never Used  Vaping Use  . Vaping Use: Never used  Substance and Sexual Activity  . Alcohol use: No  . Drug use: No  . Sexual activity: Yes    Birth control/protection: Surgical    Comment: hyst  Other Topics Concern  . Not on file  Social History Narrative  . Not on file   Social Determinants of Health   Financial Resource Strain: Not on file  Food Insecurity: Not on file  Transportation Needs: Not on file  Physical Activity: Not on file  Stress: Not on file  Social Connections: Not on file  Intimate Partner Violence: Not on file    Outpatient Medications Prior to Visit  Medication Sig  Dispense Refill  . ALPRAZolam (XANAX) 0.5 MG tablet TAKE 1/2 TO 1 TABLET BY MOUTH EVERY 8 HOURS AS NEEDED. 30 tablet 5  . Biotin 10000 MCG TABS Take 1 tablet by mouth daily.    . Blood Glucose Monitoring Suppl (TRUE METRIX METER) w/Device KIT USE AS DIRECTED 1 kit 0  . diclofenac (VOLTAREN) 75 MG EC tablet Take 75 mg by mouth 2 (two) times daily.    . DULoxetine (CYMBALTA) 60 MG capsule Take 1 capsule (60 mg total) by mouth daily. 90 capsule 1  . furosemide (LASIX) 20 MG tablet TAKE 1 TABLET EVERY DAY AS NEEDED FOR EDEMA. 90 tablet 1  . glucose blood (TRUE METRIX BLOOD GLUCOSE TEST) test strip USE AS DIRECTED TO CHECK FASTING BLOOD SUGAR 300 strip 2  . loratadine (CLARITIN) 10 MG tablet Take 1 tablet (10 mg total) by mouth daily. 90 tablet 3  . meclizine (ANTIVERT) 25 MG tablet TAKE 1 TABLET TWICE DAILY AS NEEDED FOR DIZZINESS. 180 tablet 1  . metFORMIN (GLUCOPHAGE) 500 MG tablet TAKE 1 TABLET TWICE DAILY WITH MEALS 180 tablet 1  . omeprazole (PRILOSEC) 40 MG  capsule Take 1 capsule (40 mg total) by mouth 2 (two) times daily. 180 capsule 1  . oxybutynin (DITROPAN) 5 MG tablet Take 1 tablet (5 mg total) by mouth 3 (three) times daily. 270 tablet 0  . PROVENTIL HFA 108 (90 Base) MCG/ACT inhaler INHALE 2 PUFFS BY MOUTH FOUR TIMES A DAY AS NEEDED FOR WHEEZING 1 each 2  . spironolactone-hydrochlorothiazide (ALDACTAZIDE) 25-25 MG tablet TAKE 1 TABLET EVERY DAY 90 tablet 1  . traMADol (ULTRAM) 50 MG tablet Take 1 tablet (50 mg total) by mouth 3 (three) times daily as needed. 90 tablet 1  . TRUEplus Lancets 33G MISC E11.69 use new lancet each time when checking FBS 100 each 2   No facility-administered medications prior to visit.    Allergies  Allergen Reactions  . Morphine And Related Other (See Comments)    Woozy, "out of body experience"  . Celebrex [Celecoxib] Other (See Comments)    Patient does not remember intolerance  . Gabapentin Other (See Comments)    Confusion  . Lisinopril Cough  . Guaifenesin & Derivatives Other (See Comments)    Patient cannot remember specific issue, just made her feel "weird"  . Lipitor [Atorvastatin] Rash  . Sulfa Antibiotics Rash  . Valium [Diazepam] Other (See Comments)    Lightheaded, dizziness     Review of Systems  Constitutional: Negative for chills, fatigue and fever.  HENT: Negative for congestion, ear pain, postnasal drip, rhinorrhea, sinus pressure, sinus pain and sore throat.   Respiratory: Negative for cough and shortness of breath.   Cardiovascular: Negative for chest pain.  Musculoskeletal: Positive for back pain (Sharp radiating pain).       Objective:    Physical Exam  BP 108/60   Pulse 87   Temp (!) 93.5 F (34.2 C)   Ht $R'5\' 2"'tW$  (1.575 m)   Wt 208 lb (94.3 kg)   SpO2 99%   BMI 38.04 kg/m  Wt Readings from Last 3 Encounters:  04/18/20 208 lb (94.3 kg)  01/06/20 208 lb 6.4 oz (94.5 kg)  12/09/19 207 lb 12.8 oz (94.3 kg)    Health Maintenance Due  Topic Date Due  . Hepatitis C  Screening  Never done  . HIV Screening  Never done  . PAP SMEAR-Modifier  01/22/2016  . COLONOSCOPY (Pts 45-31yrs Insurance coverage will need to be confirmed)  05/14/2016  . OPHTHALMOLOGY EXAM  09/18/2019  . FOOT EXAM  12/10/2019  . COVID-19 Vaccine (3 - Booster for Moderna series) 02/23/2020  . URINE MICROALBUMIN  04/29/2020    There are no preventive care reminders to display for this patient.   Lab Results  Component Value Date   TSH 1.470 10/29/2019   Lab Results  Component Value Date   WBC 12.6 (H) 10/29/2019   HGB 14.5 10/29/2019   HCT 43.4 10/29/2019   MCV 92 10/29/2019   PLT 497 (H) 10/29/2019   Lab Results  Component Value Date   NA 137 10/29/2019   K 4.2 10/29/2019   CO2 26 10/29/2019   GLUCOSE 107 (H) 10/29/2019   BUN 13 10/29/2019   CREATININE 0.86 10/29/2019   BILITOT 0.7 10/29/2019   ALKPHOS 87 10/29/2019   AST 15 10/29/2019   ALT 17 10/29/2019   PROT 6.6 10/29/2019   ALBUMIN 4.2 10/29/2019   CALCIUM 9.8 10/29/2019   Lab Results  Component Value Date   CHOL 171 10/29/2019   Lab Results  Component Value Date   HDL 51 10/29/2019   Lab Results  Component Value Date   LDLCALC 91 10/29/2019   Lab Results  Component Value Date   TRIG 168 (H) 10/29/2019   Lab Results  Component Value Date   CHOLHDL 3.4 10/29/2019   Lab Results  Component Value Date   HGBA1C 6.5 (H) 10/29/2019       Assessment & Plan:  There are no diagnoses linked to this encounter.   No orders of the defined types were placed in this encounter.   No orders of the defined types were placed in this encounter.  I,Hyla Coard A Macarius Ruark,acting as a scribe for [employer], NP.,have documented all relevant documentation on the behalf of Rip Harbour, NP,as directed by  Rip Harbour, NP while in the presence of Rip Harbour, NP.    I spent < time > minutes dedicated to the care of this patient on the date of this encounter to include face-to-face time  with the patient, as well as: ***  Follow-up: No follow-ups on file.  An After Visit Summary was printed and given to the patient.  Rip Harbour, NP [place] [phone]

## 2020-04-18 NOTE — Patient Instructions (Addendum)
Hip Pain The hip is the joint between the upper legs and the lower pelvis. The bones, cartilage, tendons, and muscles of your hip joint support your body and allow you to move around. Hip pain can range from a minor ache to severe pain in one or both of your hips. The pain may be felt on the inside of the hip joint near the groin, or on the outside near the buttocks and upper thigh. You may also have swelling or stiffness in your hip area. Follow these instructions at home: Managing pain, stiffness, and swelling  If directed, put ice on the painful area. To do this: ? Put ice in a plastic bag. ? Place a towel between your skin and the bag. ? Leave the ice on for 20 minutes, 2-3 times a day.  If directed, apply heat to the affected area as often as told by your health care provider. Use the heat source that your health care provider recommends, such as a moist heat pack or a heating pad. ? Place a towel between your skin and the heat source. ? Leave the heat on for 20-30 minutes. ? Remove the heat if your skin turns bright red. This is especially important if you are unable to feel pain, heat, or cold. You may have a greater risk of getting burned.      Activity  Do exercises as told by your health care provider.  Avoid activities that cause pain. General instructions  Take over-the-counter and prescription medicines only as told by your health care provider.  Keep a journal of your symptoms. Write down: ? How often you have hip pain. ? The location of your pain. ? What the pain feels like. ? What makes the pain worse.  Sleep with a pillow between your legs on your most comfortable side.  Keep all follow-up visits as told by your health care provider. This is important.   Contact a health care provider if:  You cannot put weight on your leg.  Your pain or swelling continues or gets worse after one week.  It gets harder to walk.  You have a fever. Get help right away  if:  You fall.  You have a sudden increase in pain and swelling in your hip.  Your hip is red or swollen or very tender to touch. Summary  Hip pain can range from a minor ache to severe pain in one or both of your hips.  The pain may be felt on the inside of the hip joint near the groin, or on the outside near the buttocks and upper thigh.  Avoid activities that cause pain.  Write down how often you have hip pain, the location of the pain, what makes it worse, and what it feels like. This information is not intended to replace advice given to you by your health care provider. Make sure you discuss any questions you have with your health care provider. Document Revised: 05/25/2018 Document Reviewed: 05/25/2018 Elsevier Patient Education  2021 [place] Prevention in the Home, Adult Falls can cause injuries and can happen to people of all ages. There are many things you can do to make your home safe and to help prevent falls. Ask for help when making these changes. What actions can I take to prevent falls? General Instructions  Use good lighting in all rooms. Replace any light bulbs that burn out.  Turn on the lights in dark areas. Use night-lights.  Keep items that you  use often in easy-to-reach places. Lower the shelves around your home if needed.  Set up your furniture so you have a clear path. Avoid moving your furniture around.  Do not have throw rugs or other things on the floor that can make you trip.  Avoid walking on wet floors.  If any of your floors are uneven, fix them.  Add color or contrast paint or tape to clearly mark and help you see: ? Grab bars or handrails. ? First and last steps of staircases. ? Where the edge of each step is.  If you use a stepladder: ? Make sure that it is fully opened. Do not climb a closed stepladder. ? Make sure the sides of the stepladder are locked in place. ? Ask someone to hold the stepladder while you use  it.  Know where your pets are when moving through your home. What can I do in the bathroom?  Keep the floor dry. Clean up any water on the floor right away.  Remove soap buildup in the tub or shower.  Use nonskid mats or decals on the floor of the tub or shower.  Attach bath mats securely with double-sided, nonslip rug tape.  If you need to sit down in the shower, use a plastic, nonslip stool.  Install grab bars by the toilet and in the tub and shower. Do not use towel bars as grab bars.      What can I do in the bedroom?  Make sure that you have a light by your bed that is easy to reach.  Do not use any sheets or blankets for your bed that hang to the floor.  Have a firm chair with side arms that you can use for support when you get dressed. What can I do in the kitchen?  Clean up any spills right away.  If you need to reach something above you, use a step stool with a grab bar.  Keep electrical cords out of the way.  Do not use floor polish or wax that makes floors slippery. What can I do with my stairs?  Do not leave any items on the stairs.  Make sure that you have a light switch at the top and the bottom of the stairs.  Make sure that there are handrails on both sides of the stairs. Fix handrails that are broken or loose.  Install nonslip stair treads on all your stairs.  Avoid having throw rugs at the top or bottom of the stairs.  Choose a carpet that does not hide the edge of the steps on the stairs.  Check carpeting to make sure that it is firmly attached to the stairs. Fix carpet that is loose or worn. What can I do on the outside of my home?  Use bright outdoor lighting.  Fix the edges of walkways and driveways and fix any cracks.  Remove anything that might make you trip as you walk through a door, such as a raised step or threshold.  Trim any bushes or trees on paths to your home.  Check to see if handrails are loose or broken and that both sides  of all steps have handrails.  Install guardrails along the edges of any raised decks and porches.  Clear paths of anything that can make you trip, such as tools or rocks.  Have leaves, snow, or ice cleared regularly.  Use sand or salt on paths during winter.  Clean up any spills in your garage right  away. This includes grease or oil spills. What other actions can I take?  Wear shoes that: ? Have a low heel. Do not wear high heels. ? Have rubber bottoms. ? Feel good on your feet and fit well. ? Are closed at the toe. Do not wear open-toe sandals.  Use tools that help you move around if needed. These include: ? Canes. ? Walkers. ? Scooters. ? Crutches.  Review your medicines with your doctor. Some medicines can make you feel dizzy. This can increase your chance of falling. Ask your doctor what else you can do to help prevent falls. Where to find more information  Centers for Disease Control and Prevention, [employer]: http://www.wolf.info/  National Institute on Aging: http://kim-miller.com/ Contact a doctor if:  You are afraid of falling at home.  You feel weak, drowsy, or dizzy at home.  You fall at home. Summary  There are many simple things that you can do to make your home safe and to help prevent falls.  Ways to make your home safe include removing things that can make you trip and installing grab bars in the bathroom.  Ask for help when making these changes in your home. This information is not intended to replace advice given to you by your health care provider. Make sure you discuss any questions you have with your health care provider. Document Revised: 08/11/2019 Document Reviewed: 08/11/2019 Elsevier Patient Education  [place].

## 2020-04-18 NOTE — Progress Notes (Signed)
Acute Office Visit  Subjective:    Patient ID: Andrea White, female    DOB: 25-Jan-1955, 65 y.o.   MRN: [id number]  Chief Complaint  Patient presents with  . Right hip pain    HPI Patient is in today for right hip/groin pain that radiates to right lower back after experiencing a fall 4-days ago. She fell on her right forearm and hip. Pain is described as 10/10. Treatment has included TENs unit, Voltaren gel, and Tramadol.   Past Medical History:  Diagnosis Date  . Allergy   . Anxiety   . Arthritis   . Bradycardia, unspecified   . Bruises easily   . Calf pain    when walking  . Fibromyalgia   . Frequent headaches   . GERD (gastroesophageal reflux disease)   . H/O bladder problems   . Hypertension   . IBS (irritable bowel syndrome)   . Localized edema   . Mild persistent asthma, uncomplicated   . Morbid (severe) obesity due to excess calories ([place])   . Muscle pain   . Myocardial infarction ([place])   . Other hypotension   . Ovarian cyst   . Slow transit constipation   . Solitary pulmonary nodule   . Swelling    feet and legs  . Type 2 diabetes mellitus with diabetic nephropathy ([place])   . Varicose veins of bilateral lower extremities with pain     Past Surgical History:  Procedure Laterality Date  . BUNIONECTOMY     left foot 5th toe  . CARDIAC CATHETERIZATION    . carpel tunnel surgery    . CESAREAN SECTION    . CHOLECYSTECTOMY    . GALLBLADDER SURGERY    . TONSILLECTOMY    . TUBAL LIGATION    . VAGINAL HYSTERECTOMY  2003   [facility]/SR    Family History  Problem Relation Age of Onset  . Heart disease Father   . Hypertension Father   . Stroke Father   . Heart attack Father   . Cerebrovascular Accident Father   . Diabetes Sister   . Kidney disease Sister   . Migraines Daughter   . Cancer Maternal Aunt 74       breast    Social History   Socioeconomic History  . Marital status: Married    Spouse name: Not on file  . Number of children: 1  .  Years of education: Not on file  . Highest education level: Not on file  Occupational History  . Occupation: Disabled due to foot problems since 1993  Tobacco Use  . Smoking status: Never Smoker  . Smokeless tobacco: Never Used  Vaping Use  . Vaping Use: Never used  Substance and Sexual Activity  . Alcohol use: No  . Drug use: No  . Sexual activity: Yes    Birth control/protection: Surgical    Comment: hyst  Other Topics Concern  . Not on file  Social History Narrative  . Not on file   Social Determinants of Health   Financial Resource Strain: Not on file  Food Insecurity: Not on file  Transportation Needs: Not on file  Physical Activity: Not on file  Stress: Not on file  Social Connections: Not on file  Intimate Partner Violence: Not on file    Outpatient Medications Prior to Visit  Medication Sig Dispense Refill  . ALPRAZolam (XANAX) 0.5 MG tablet TAKE 1/2 TO 1 TABLET BY MOUTH EVERY 8 HOURS AS NEEDED. 30 tablet 5  .  Biotin 10000 MCG TABS Take 1 tablet by mouth daily.    . Blood Glucose Monitoring Suppl (TRUE METRIX METER) w/Device KIT USE AS DIRECTED 1 kit 0  . diclofenac (VOLTAREN) 75 MG EC tablet Take 75 mg by mouth 2 (two) times daily.    . DULoxetine (CYMBALTA) 60 MG capsule Take 1 capsule (60 mg total) by mouth daily. 90 capsule 1  . furosemide (LASIX) 20 MG tablet TAKE 1 TABLET EVERY DAY AS NEEDED FOR EDEMA. 90 tablet 1  . glucose blood (TRUE METRIX BLOOD GLUCOSE TEST) test strip USE AS DIRECTED TO CHECK FASTING BLOOD SUGAR 300 strip 2  . loratadine (CLARITIN) 10 MG tablet Take 1 tablet (10 mg total) by mouth daily. 90 tablet 3  . meclizine (ANTIVERT) 25 MG tablet TAKE 1 TABLET TWICE DAILY AS NEEDED FOR DIZZINESS. 180 tablet 1  . metFORMIN (GLUCOPHAGE) 500 MG tablet TAKE 1 TABLET TWICE DAILY WITH MEALS 180 tablet 1  . omeprazole (PRILOSEC) 40 MG capsule Take 1 capsule (40 mg total) by mouth 2 (two) times daily. 180 capsule 1  . oxybutynin (DITROPAN) 5 MG tablet  Take 1 tablet (5 mg total) by mouth 3 (three) times daily. 270 tablet 0  . PROVENTIL HFA 108 (90 Base) MCG/ACT inhaler INHALE 2 PUFFS BY MOUTH FOUR TIMES A DAY AS NEEDED FOR WHEEZING 1 each 2  . spironolactone-hydrochlorothiazide (ALDACTAZIDE) 25-25 MG tablet TAKE 1 TABLET EVERY DAY 90 tablet 1  . traMADol (ULTRAM) 50 MG tablet Take 1 tablet (50 mg total) by mouth 3 (three) times daily as needed. 90 tablet 1  . TRUEplus Lancets 33G MISC E11.69 use new lancet each time when checking FBS 100 each 2   No facility-administered medications prior to visit.    Allergies  Allergen Reactions  . Morphine And Related Other (See Comments)    Woozy, "out of body experience"  . Celebrex [Celecoxib] Other (See Comments)    Patient does not remember intolerance  . Gabapentin Other (See Comments)    Confusion  . Lisinopril Cough  . Guaifenesin & Derivatives Other (See Comments)    Patient cannot remember specific issue, just made her feel "weird"  . Lipitor [Atorvastatin] Rash  . Sulfa Antibiotics Rash  . Valium [Diazepam] Other (See Comments)    Lightheaded, dizziness     Review of Systems  Musculoskeletal: Positive for arthralgias (right hip), back pain (chronic ), gait problem and myalgias (right hip).  Skin: Negative.   All other systems reviewed and are negative.      Objective:    Physical Exam Vitals reviewed.  Constitutional:      Appearance: Normal appearance.  HENT:     Head: Normocephalic.  Cardiovascular:     Rate and Rhythm: Normal rate and regular rhythm.     Pulses: Normal pulses.     Heart sounds: Normal heart sounds.  Pulmonary:     Effort: Pulmonary effort is normal.     Breath sounds: Normal breath sounds.  Abdominal:     General: Bowel sounds are normal.     Palpations: Abdomen is soft.     Comments: Right groin tenderness  Musculoskeletal:        General: Tenderness (right hip) present.  Skin:    General: Skin is warm and dry.     Capillary Refill:  Capillary refill takes less than 2 seconds.  Neurological:     General: No focal deficit present.     Mental Status: She is alert and oriented to person,  place, and time.  Psychiatric:        Mood and Affect: Mood normal.        Behavior: Behavior normal.     BP 108/60   Pulse 87   Temp (!) 93.5 F (34.2 C)   Ht _0  (1.575 m)   Wt 208 lb (94.3 kg)   SpO2 99%   BMI 38.04 kg/m  Wt Readings from Last 3 Encounters:  04/18/20 208 lb (94.3 kg)  01/06/20 208 lb 6.4 oz (94.5 kg)  12/09/19 207 lb 12.8 oz (94.3 kg)    Health Maintenance Due  Topic Date Due  . Hepatitis C Screening  Never done  . HIV Screening  Never done  . PAP SMEAR-Modifier  01/22/2016  . COLONOSCOPY (Pts 45-48yr Insurance coverage will need to be confirmed)  05/14/2016  . OPHTHALMOLOGY EXAM  09/18/2019  . FOOT EXAM  12/10/2019  . COVID-19 Vaccine (3 - Booster for Moderna series) 02/23/2020  . URINE MICROALBUMIN  04/29/2020      Lab Results  Component Value Date   TSH 1.470 10/29/2019   Lab Results  Component Value Date   WBC 12.6 (H) 10/29/2019   HGB 14.5 10/29/2019   HCT 43.4 10/29/2019   MCV 92 10/29/2019   PLT 497 (H) 10/29/2019   Lab Results  Component Value Date   NA 137 10/29/2019   K 4.2 10/29/2019   CO2 26 10/29/2019   GLUCOSE 107 (H) 10/29/2019   BUN 13 10/29/2019   CREATININE 0.86 10/29/2019   BILITOT 0.7 10/29/2019   ALKPHOS 87 10/29/2019   AST 15 10/29/2019   ALT 17 10/29/2019   PROT 6.6 10/29/2019   ALBUMIN 4.2 10/29/2019   CALCIUM 9.8 10/29/2019   Lab Results  Component Value Date   CHOL 171 10/29/2019   Lab Results  Component Value Date   HDL 51 10/29/2019   Lab Results  Component Value Date   LDLCALC 91 10/29/2019   Lab Results  Component Value Date   TRIG 168 (H) 10/29/2019   Lab Results  Component Value Date   CHOLHDL 3.4 10/29/2019   Lab Results  Component Value Date   HGBA1C 6.5 (H) 10/29/2019       Assessment & Plan:    1. Right hip  pain - ketorolac (TORADOL) injection 60 mg  2. Fall, initial encounter   Toradol 60 mg given for right hip pain Rest, ice, and take Tramadol as needed, topical Voltaren gel to right hip O[facility]ain right [facility], have reviewed all documentation for this visit. The documentation on 04/18/20 for the exam, diagnosis, procedures, and orders are all accurate and complete.   Follow-up: April 8th, 2022 with Dr CTobie Poetor sooner if needed  Signed, SRip Harbour NP

## 2020-04-20 ENCOUNTER — Other Ambulatory Visit: Payer: Self-pay | Admitting: Family Medicine

## 2020-04-27 NOTE — Progress Notes (Signed)
Chronic Care Management Pharmacy Note  05/04/2020 Name:  Andrea White MRN:  [id number] DOB:  Jun 09, 1955  Subjective: Andrea White is an 65 y.o. year old female who is a primary patient of Cox, Kirsten, MD.  The CCM team was consulted for assistance with disease management and care coordination needs.    Plan Updates:   Patient resumed pravastatin 20 mg after recommendation with recent blood work. She is monitoring for cramps.   Patient states Dr. Tobie Poet advised her to stop spironolactone-hctz in the past but she has not. She would like to stop taking and monitor bp closely.   Ventolin is no longer available through patient assistance. Patient informed of Good Rx option at [facility] for $15. Patient plans to call [employer] to see if any of the generic albuterol inhaler options are covered through mail order at no cost.   Engaged with patient by telephone for initial visit in response to provider referral for pharmacy case management and/or care coordination services.   Consent to Services:  The patient was given the following information about Chronic Care Management services today, agreed to services, and gave verbal consent: 1. CCM service includes personalized support from designated clinical staff supervised by the primary care provider, including individualized plan of care and coordination with other care providers 2. 24/7 contact phone numbers for assistance for urgent and routine care needs. 3. Service will only be billed when office clinical staff spend 20 minutes or more in a month to coordinate care. 4. Only one practitioner may furnish and bill the service in a calendar month. 5.The patient may stop CCM services at any time (effective at the end of the month) by phone call to the office staff. 6. The patient will be responsible for cost sharing (co-pay) of up to 20% of the service fee (after annual deductible is met). Patient agreed to services and consent obtained.  Patient Care  Team: [name], MD as PCP - General (Family Medicine) Joya Salm, MD as Referring Physician (Orthopedic Surgery) Landis Martins, DPM as Consulting Physician (Podiatry) Delsa Bern, MD as Consulting Physician (Obstetrics and Gynecology) Lynnell Dike, [place] (Optometry) Burnice Logan, [facility] as Pharmacist (Pharmacist)  Recent office visits: 04/18/2020 - right hip pain after fall. Toradol given. X-ray ordered. Rest/ice and take tramadol prn. Voltaren gel to right hip 01/06/2020 - frequent urination. Negative for infection. Start on oxybutynin 5 mg three times daily.  12/09/2019 - cystitis with hematuria - macrobid given for 1 week.  10/28/2020 - flu shot given. Labs drawn. Encouraged healthy eating and exercise.  Recent consult visits: 03/31/2020 - podiatry - foot exam and nail care. Spacers to prevent toes rubbing/blister recurrence.  03/09/2020 - neurosurgery - note not available.  12/15/2019 - podiatry - pain due to onychomycosis of toenails of both feet. Mechanically debrided. Return in 3 months for nail care.  11/22/2019 - podiatry - foot lesion lanced. Dressing applied with ointment.  Hospital visits: None in previous 6 months  Objective:  Lab Results  Component Value Date   CREATININE 0.76 04/28/2020   BUN 10 04/28/2020   GFRNONAA 73 10/29/2019   GFRAA 84 10/29/2019   NA 138 04/28/2020   K 3.9 04/28/2020   CALCIUM 9.9 04/28/2020   CO2 27 04/28/2020   GLUCOSE 106 (H) 04/28/2020    Lab Results  Component Value Date/Time   HGBA1C 6.6 (H) 04/28/2020 08:47 AM   HGBA1C 6.5 (H) 10/29/2019 08:50 AM   MICROALBUR 30 04/30/2019 12:32 PM  Last diabetic Eye exam: No results found for: HMDIABEYEEXA  Last diabetic Foot exam: No results found for: HMDIABFOOTEX   Lab Results  Component Value Date   CHOL 200 (H) 04/28/2020   HDL 44 04/28/2020   LDLCALC 123 (H) 04/28/2020   TRIG 186 (H) 04/28/2020   CHOLHDL 4.5 (H) 04/28/2020    Hepatic Function Latest Ref Rng & Units  04/28/2020 10/29/2019 04/30/2019  Total Protein 6.0 - 8.5 g/dL 6.7 6.6 6.3  Albumin 3.8 - 4.8 g/dL 4.5 4.2 3.9  AST 0 - 40 IU/L _0 ALT 0 - 32 IU/L _1 Alk Phosphatase 44 - 121 IU/L 75 87 80  Total Bilirubin 0.0 - 1.2 mg/dL 0.7 0.7 0.5    Lab Results  Component Value Date/Time   TSH 1.470 10/29/2019 08:50 AM    CBC Latest Ref Rng & Units 04/28/2020 10/29/2019 04/30/2019  WBC 3.4 - 10.8 x10E3/uL 11.1(H) 12.6(H) 10.0  Hemoglobin 11.1 - 15.9 g/dL 13.6 14.5 13.2  Hematocrit 34.0 - 46.6 % 41.9 43.4 39.1  Platelets 150 - 450 x10E3/uL 487(H) 497(H) 436    No results found for: VD25OH  Clinical ASCVD: Yes  The ASCVD Risk score Mikey Bussing DC Jr., et al., 2013) failed to calculate for the following reasons:   The patient has a prior MI or stroke diagnosis    Depression screen [facility] 2/9 04/18/2020 10/29/2019 05/26/2019  Decreased Interest 0 0 0  Down, Depressed, Hopeless 0 0 0  PHQ - 2 Score 0 0 0  Altered sleeping 0 0 -  Tired, decreased energy 0 0 -  Change in appetite 0 0 -  Feeling bad or failure about yourself  0 0 -  Trouble concentrating 0 0 -  Moving slowly or fidgety/restless 0 0 -  Suicidal thoughts 0 0 -  PHQ-9 Score 0 0 -  Difficult doing work/chores Not difficult at all - -      Social History   Tobacco Use  Smoking Status Never Smoker  Smokeless Tobacco Never Used   BP Readings from Last 3 Encounters:  04/28/20 110/68  04/18/20 108/60  01/06/20 106/72   Pulse Readings from Last 3 Encounters:  04/28/20 68  04/18/20 87  01/06/20 88   Wt Readings from Last 3 Encounters:  04/28/20 208 lb (94.3 kg)  04/18/20 208 lb (94.3 kg)  01/06/20 208 lb 6.4 oz (94.5 kg)   BMI Readings from Last 3 Encounters:  04/28/20 38.04 kg/m  04/18/20 38.04 kg/m  01/06/20 38.12 kg/m    Assessment/Interventions: Review of patient past medical history, allergies, medications, health status, including review of consultants reports, laboratory and other test data, was performed as  part of comprehensive evaluation and provision of chronic care management services.   SDOH:  (Social Determinants of Health) assessments and interventions performed: Yes  SDOH Screenings   Alcohol Screen: Low Risk   . Last Alcohol Screening Score (AUDIT): 0  Depression (PHQ2-9): Low Risk   . PHQ-2 Score: 0  Financial Resource Strain: Not on file  Food Insecurity: No Food Insecurity  . Worried About Charity fundraiser in the Last Year: Never true  . Ran Out of Food in the Last Year: Never true  Housing: Low Risk   . Last Housing Risk Score: 0  Physical Activity: Not on file  Social Connections: Not on file  Stress: Not on file  Tobacco Use: Low Risk   . Smoking Tobacco Use: Never Smoker  . Smokeless Tobacco Use:  Never Used  Transportation Needs: No Transportation Needs  . Lack of Transportation (Medical): No  . Lack of Transportation (Non-Medical): No    CCM Care Plan  Allergies  Allergen Reactions  . Morphine And Related Other (See Comments)    Woozy, "out of body experience"  . Celebrex [Celecoxib] Other (See Comments)    Patient does not remember intolerance  . Gabapentin Other (See Comments)    Confusion  . Lisinopril Cough  . Guaifenesin & Derivatives Other (See Comments)    Patient cannot remember specific issue, just made her feel "weird"  . Lipitor [Atorvastatin] Rash  . Sulfa Antibiotics Rash  . Valium [Diazepam] Other (See Comments)    Lightheaded, dizziness     Medications Reviewed Today    Reviewed by Burnice Logan, [facility] (Pharmacist) on 05/04/20 at 1525  Med List Status: <None>  Medication Order Taking? Sig Documenting Provider Last Dose Status Informant  ALPRAZolam (XANAX) 0.5 MG tablet [id number] Yes TAKE 1/2 TO 1 TABLET BY MOUTH EVERY 8 HOURS AS NEEDED. [name], MD Taking Active   APPLE CIDER VINEGAR PO [id number] Yes Take by mouth daily. [provider] Taking Active   Biotin 10000 MCG TABS [id number] Yes Take 1 tablet by mouth daily.  [provider] Taking Active   Blood Glucose Monitoring Suppl (TRUE METRIX METER) w/Device KIT [id number] Yes USE AS DIRECTED Cox, Kirsten, MD Taking Active   diclofenac (VOLTAREN) 75 MG EC tablet [id number] Yes Take 75 mg by mouth 2 (two) times daily. [provider] Taking Active   diclofenac Sodium (VOLTAREN) 1 % GEL [id number] Yes Apply topically 4 (four) times daily as needed. [provider] Taking Active   DULoxetine (CYMBALTA) 60 MG capsule [id number] Yes Take 1 capsule (60 mg total) by mouth daily. Cox, Kirsten, MD Taking Active   glucose blood (TRUE METRIX BLOOD GLUCOSE TEST) test strip [id number] Yes USE AS DIRECTED TO CHECK FASTING BLOOD SUGAR Cox, Kirsten, MD Taking Active   loratadine (CLARITIN) 10 MG tablet [id number] Yes Take 1 tablet (10 mg total) by mouth daily. Cox, Kirsten, MD Taking Active   meclizine (ANTIVERT) 25 MG tablet [id number] Yes TAKE 1 TABLET TWICE DAILY AS NEEDED FOR DIZZINESS. Cox, Kirsten, MD Taking Active   metFORMIN (GLUCOPHAGE) 500 MG tablet [id number] Yes TAKE 1 TABLET TWICE DAILY WITH MEALS Cox, Kirsten, MD Taking Active   Multiple Vitamin (MULTIVITAMIN) tablet [id number] Yes Take 1 tablet by mouth daily. [provider] Taking Active   omeprazole (PRILOSEC) 40 MG capsule [id number] Yes Take 1 capsule (40 mg total) by mouth 2 (two) times daily. Cox, Kirsten, MD Taking Active   oxybutynin (DITROPAN) 5 MG tablet [id number] Yes Take 1 tablet (5 mg total) by mouth 3 (three) times daily.  Patient taking differently: Take 5 mg by mouth 2 (two) times daily.   Cox, Kirsten, MD Taking Active   pravastatin (PRAVACHOL) 20 MG tablet [id number] Yes Take 20 mg by mouth daily. [provider] Taking Active   PROVENTIL HFA 108 (90 Base) MCG/ACT inhaler [id number] Yes INHALE 2 PUFFS BY MOUTH FOUR TIMES A DAY AS NEEDED FOR WHEEZING Cox, Kirsten, MD Taking Active   spironolactone-hydrochlorothiazide (ALDACTAZIDE) 25-25 MG tablet [id number] No TAKE  1 TABLET EVERY DAY  Patient not taking: Reported on 05/04/2020   [name], MD Not Taking Active   traMADol (ULTRAM) 50 MG tablet [id number] Yes Take 1 tablet (50 mg total) by mouth 3 (three) times daily as needed. CoxElnita Maxwell, MD Taking Active   TRUEplus  Lancets 33G MISC [id number] Yes E11.69 use new lancet each time when checking FBS Cox, Kirsten, MD Taking Active           Patient Active Problem List   Diagnosis Date Noted  . Diabetic polyneuropathy associated with type 2 diabetes mellitus ([place]) 04/30/2019  . Essential hypertension 04/30/2019  . Mixed hyperlipidemia 04/30/2019  . Mixed diabetic hyperlipidemia associated with type 2 diabetes mellitus ([place]) 04/30/2019  . Major depressive disorder, single episode, mild ([place]) 04/30/2019  . Myocardial infarction ([place]) 10/31/2017  . Primary fibromyalgia syndrome 10/31/2017  . Gastroesophageal reflux disease 03/15/2015  . Asthma 03/15/2015  . Obesity 03/15/2015  . Herniated disc 06/10/2011  . History of heart attack 06/10/2011  . Cyst of ovary     Immunization History  Administered Date(s) Administered  . Influenza Inj Mdck Quad Pf 10/29/2019  . Influenza-Unspecified 11/03/2017, 09/25/2018  . Moderna Sars-Covid-2 Vaccination 07/27/2019, 08/23/2019  . Pneumococcal Polysaccharide-23 11/06/2015  . Tdap 08/29/2015    Conditions to be addressed/monitored:  Hypertension, Hyperlipidemia, Diabetes, GERD, Asthma, Depression and fibromyalgia, pain.   Care Plan : [place]  Updates made by Burnice Logan, RPH since 05/04/2020 12:00 AM    Problem: htn, hld, dm   Priority: High  Onset Date: 05/04/2020    Long-Range Goal: Disease Management   Start Date: 05/04/2020  Expected End Date: 05/04/2021  This Visit's Progress: On track  Priority: High  Note:    Current Barriers:  . Unable to independently afford treatment regimen  Pharmacist Clinical Goal(s):  Marland Kitchen Patient will verbalize ability to afford treatment regimen  through collaboration with PharmD and provider.    Interventions: . 1:1 collaboration with [name], MD regarding development and update of comprehensive plan of care as evidenced by provider attestation and co-signature . Inter-disciplinary care team collaboration (see longitudinal plan of care) . Comprehensive medication review performed; medication list updated in electronic medical record  Hypertension (BP goal <130/80) -Controlled -Current treatment: . Spironolactone-hydrochlorothiazide 25-25 mg daily - patient was advised to quit medication by Dr. Tobie Poet but has not at this time. Plans to stop and monitor blood pressure.  -Medications previously tried: lisinopril, furosemide  -Current home readings: well controlled  -Current dietary habits: patient reports she eats what she wants. Eats fast food.  -Current exercise habits: works in yard but back pain limits formal exercise.  -Denies hypotensive/hypertensive symptoms -Educated on BP goals and benefits of medications for prevention of heart attack, stroke and kidney damage; Daily salt intake goal < 2300 mg; Exercise goal of 150 minutes per week; -Counseled to monitor BP at home during transition of stopping spironolactone-hydrochlorothiazide, document, and provide log at future appointments -Counseled on diet and exercise extensively Recommended monitoring blood pressure while stopping spironolactone-hydrochlorothiazide like Dr. Tobie Poet previously recommended.   Hyperlipidemia: (LDL goal < 55) -Uncontrolled -Current treatment: . Pravastatin 20 mg daily  -Medications previously tried:  lipitor (rash) -Current dietary patterns: eats fast food and what she wants per patient.  -Current exercise habits: works in yard -Educated on [employer];  Benefits of statin for ASCVD risk reduction; Importance of limiting foods high in cholesterol; -Counseled on diet and exercise extensively Recommended to continue current  medication  Diabetes (A1c goal <7%) -Controlled -Current medications: . metformin 500 mg bid with meals . True Metrix Meter testing supplies -Medications previously tried: none reported  -Current home glucose readings . fasting glucose: 109, 111-121    . post prandial glucose: none reported -Denies hypoglycemic/hyperglycemic symptoms -Current meal patterns:  .  breakfast: cereal (Frosted shredded wheat), bacon/eggs/grits/toast, or carnation instant breakfast  . Lunch and dinner: fast food,  corn dog and fries -Current exercise: works in yard -Educated on A1c and blood sugar goals; Complications of diabetes including kidney damage, retinal damage, and cardiovascular disease; -Counseled to check feet daily and get yearly eye exams -Counseled on diet and exercise extensively Recommended to continue current medication  Anxiety/Depression (Goal: manage symptoms) -Controlled -Current treatment  . duloxetine 60 mg daily  . Alprazolam 0.5 mg 1/2-1 tablet by mouth every 8 hours prn  -Medications previously tried: none rpeorted  -Recommended to continue current medication  GERD (Goal: manage symptoms of GERD) -Controlled -Current treatment  . omeprazole 40 mg bid -Medications previously tried: none reported  -Counseled on diet and exercise extensively Recommended to continue current medication  Pain  (Goal: manage pain ) -Controlled -Current treatment  . volataren 75 mg bid  . voltaren gel applied topically . Tramadol 50 mg tid prn  -Medications previously tried: Celebrex  -Recommended to continue current medication  Overactive Bladder (Goal: manage symptoms of OAB) -Controlled -Current treatment  . oxybutynin 5 mg bid  -Medications previously tried: none reported  -Recommended to continue current medication  Health Maintenance -Vaccine gaps: Third COVID shot -not interested in any more. shingrix - has not had and doesn't want -Current therapy:  . biotin 10,000 mcg daily   . Meclizine 25 mg bid prn dizziness . Apple cider vinegar gummy daily . Multivitamin daily  -Educated on Cost vs benefit of each product must be carefully weighed by individual consumer -Patient is satisfied with current therapy and denies issues -Counseled on diet and exercise extensively Recommended to continue current medication   Patient Goals/Self-Care Activities . Patient will:  - take medications as prescribed focus on medication adherence by using pill box check glucose daily , document, and provide at future appointments engage in dietary modifications by reducing fried/fatty foods and carbohydrate/sugar consumption.   Follow Up Plan: Telephone follow up appointment with care management team member scheduled for: 03/2021      Medication Assistance: Patient was denied [employer] patient assistnace for Ventolin. They no longer supply medication. Patient informed of Good Rx $15 at [employer] Patient is calling [employer] to determine if they have a specific generic albuterol that would be no charge.   Patient's preferred pharmacy is:  [place], [place] - [place] [address] Phone: [phone] Fax: [phone]  [facility] - [place], [address] [place] [address] Phone: [phone] Fax: [phone]  [place], [address] [place] [address] Phone: [phone] Fax: [phone]  Uses pill box? Yes Pt endorses good compliance  We discussed: Current pharmacy is preferred with insurance plan and patient is satisfied with pharmacy services Patient decided to: Continue current medication management strategy  Care Plan and Follow Up Patient Decision:  Patient agrees to Care Plan and Follow-up.  Plan: Telephone follow up appointment with care management team member scheduled for:  03/2021

## 2020-04-28 ENCOUNTER — Ambulatory Visit (INDEPENDENT_AMBULATORY_CARE_PROVIDER_SITE_OTHER): Payer: Medicare HMO | Admitting: Nurse Practitioner

## 2020-04-28 ENCOUNTER — Encounter: Payer: Self-pay | Admitting: Nurse Practitioner

## 2020-04-28 ENCOUNTER — Ambulatory Visit (INDEPENDENT_AMBULATORY_CARE_PROVIDER_SITE_OTHER): Payer: Medicare HMO | Admitting: Sports Medicine

## 2020-04-28 ENCOUNTER — Other Ambulatory Visit: Payer: Self-pay

## 2020-04-28 ENCOUNTER — Ambulatory Visit: Payer: Medicare HMO | Admitting: Family Medicine

## 2020-04-28 VITALS — BP 110/68 | HR 68 | Temp 97.6°F | Ht 62.0 in | Wt 208.0 lb

## 2020-04-28 DIAGNOSIS — K219 Gastro-esophageal reflux disease without esophagitis: Secondary | ICD-10-CM

## 2020-04-28 DIAGNOSIS — L819 Disorder of pigmentation, unspecified: Secondary | ICD-10-CM | POA: Diagnosis not present

## 2020-04-28 DIAGNOSIS — E1169 Type 2 diabetes mellitus with other specified complication: Secondary | ICD-10-CM | POA: Diagnosis not present

## 2020-04-28 DIAGNOSIS — E1142 Type 2 diabetes mellitus with diabetic polyneuropathy: Secondary | ICD-10-CM

## 2020-04-28 DIAGNOSIS — I10 Essential (primary) hypertension: Secondary | ICD-10-CM | POA: Diagnosis not present

## 2020-04-28 DIAGNOSIS — M2141 Flat foot [pes planus] (acquired), right foot: Secondary | ICD-10-CM

## 2020-04-28 DIAGNOSIS — E782 Mixed hyperlipidemia: Secondary | ICD-10-CM

## 2020-04-28 DIAGNOSIS — G894 Chronic pain syndrome: Secondary | ICD-10-CM | POA: Diagnosis not present

## 2020-04-28 DIAGNOSIS — M779 Enthesopathy, unspecified: Secondary | ICD-10-CM

## 2020-04-28 DIAGNOSIS — M2142 Flat foot [pes planus] (acquired), left foot: Secondary | ICD-10-CM

## 2020-04-28 NOTE — Patient Instructions (Addendum)
Continue all medication We will call you with lab results and dermatology appointment Recommend diabetic eye exam, pap smear, and screening colonoscopy (Pt will schedule) Fasting follow-up in 24-month  Preventive Care [id number] Old, Female Preventive care refers to lifestyle choices and visits with your health care provider that can promote health and wellness. This includes:  A yearly physical exam. This is also called an annual wellness visit.  Regular dental and eye exams.  Immunizations.  Screening for certain conditions.  Healthy lifestyle choices, such as: ? Eating a healthy diet. ? Getting regular exercise. ? Not using drugs or products that contain nicotine and tobacco. ? Limiting alcohol use. What can I expect for my preventive care visit? Physical exam Your health care provider will check your:  Height and weight. These may be used to calculate your BMI (body mass index). BMI is a measurement that tells if you are at a healthy weight.  Heart rate and blood pressure.  Body temperature.  Skin for abnormal spots. Counseling Your health care provider may ask you questions about your:  Past medical problems.  Family's medical history.  Alcohol, tobacco, and drug use.  Emotional well-being.  Home life and relationship well-being.  Sexual activity.  Diet, exercise, and sleep habits.  Work and work eStatistician  Access to firearms.  Method of birth control.  Menstrual cycle.  Pregnancy history. What immunizations do I need? Vaccines are usually given at various ages, according to a schedule. Your health care provider will recommend vaccines for you based on your age, medical history, and lifestyle or other factors, such as travel or where you work.   What tests do I need? Blood tests  Lipid and cholesterol levels. These may be checked every 5 years, or more often if you are over 63years old.  Hepatitis C test.  Hepatitis B  test. Screening  Lung cancer screening. You may have this screening every year starting at age 63if you have a 30-pack-year history of smoking and currently smoke or have quit within the past 15 years.  Colorectal cancer screening. ? All adults should have this screening starting at age 63and continuing until age 63 ? Your health care provider may recommend screening at age 8039if you are at increased risk. ? You will have tests every 1-10 years, depending on your results and the type of screening test.  Diabetes screening. ? This is done by checking your blood sugar (glucose) after you have not eaten for a while (fasting). ? You may have this done every 1-3 years.  Mammogram. ? This may be done every 1-2 years. ? Talk with your health care provider about when you should start having regular mammograms. This may depend on whether you have a family history of breast cancer.  BRCA-related cancer screening. This may be done if you have a family history of breast, ovarian, tubal, or peritoneal cancers.  Pelvic exam and Pap test. ? This may be done every 3 years starting at age 63 ? Starting at age 63 this may be done every 5 years if you have a Pap test in combination with an HPV test. Other tests  STD (sexually transmitted disease) testing, if you are at risk.  Bone density scan. This is done to screen for osteoporosis. You may have this scan if you are at high risk for osteoporosis. Talk with your health care provider about your test results, treatment options, and if necessary, the need for more tests. Follow these  instructions at home: Eating and drinking  Eat a diet that includes fresh fruits and vegetables, whole grains, lean protein, and low-fat dairy products.  Take vitamin and mineral supplements as recommended by your health care provider.  Do not drink alcohol if: ? Your health care provider tells you not to drink. ? You are pregnant, may be pregnant, or are planning  to become pregnant.  If you drink alcohol: ? Limit how much you have to 0-1 drink a day. ? Be aware of how much alcohol is in your drink. In the [place] one drink equals one 12 oz bottle of beer (355 mL), one 5 oz glass of wine (148 mL), or one 1 oz glass of hard liquor (44 mL).   Lifestyle  Take daily care of your teeth and gums. Brush your teeth every morning and night with fluoride toothpaste. Floss one time each day.  Stay active. Exercise for at least 30 minutes 5 or more days each week.  Do not use any products that contain nicotine or tobacco, such as cigarettes, e-cigarettes, and chewing tobacco. If you need help quitting, ask your health care provider.  Do not use drugs.  If you are sexually active, practice safe sex. Use a condom or other form of protection to prevent STIs (sexually transmitted infections).  If you do not wish to become pregnant, use a form of birth control. If you plan to become pregnant, see your health care provider for a prepregnancy visit.  If told by your health care provider, take low-dose aspirin daily starting at age 69.  Find healthy ways to cope with stress, such as: ? Meditation, yoga, or listening to music. ? Journaling. ? Talking to a trusted person. ? Spending time with friends and family. Safety  Always wear your seat belt while driving or riding in a vehicle.  Do not drive: ? If you have been drinking alcohol. Do not ride with someone who has been drinking. ? When you are tired or distracted. ? While texting.  Wear a helmet and other protective equipment during sports activities.  If you have firearms in your house, make sure you follow all gun safety procedures. What's next?  Visit your health care provider once a year for an annual wellness visit.  Ask your health care provider how often you should have your eyes and teeth checked.  Stay up to date on all vaccines. This information is not intended to replace advice given to you  by your health care provider. Make sure you discuss any questions you have with your health care provider. Document Revised: 10/12/2019 Document Reviewed: 09/18/2017 Elsevier Patient Education  2021 [employer].

## 2020-04-28 NOTE — Progress Notes (Signed)
Patient presented for foam casting for 3 pair diabetic shoe inserts. Patient is measured with a Brannok device to be a size 9 wide  Diabetic shoes are chosen from the [employer]. The shoes chosen are [id number]  The patient will be contacted when the shoes and inserts are ready to be picked up.

## 2020-04-28 NOTE — Progress Notes (Signed)
Established Patient Office Visit  Subjective:  Patient ID: Andrea White, female    DOB: 03/17/1955  Age: 65 y.o. MRN: [id number]  CC:  Chief Complaint  Patient presents with  . Diabetes    HPI Andrea White is a 65 year old Caucasian female that presents for follow-up of type 2 diabetes mellitus, mixed hyperlipidemia, hypertension, and GERD. Pt is seeing podiatrist this am. She is up-to-date on screening mammogram, negative on 08/10/19. She tells me she is due for routine eye exam, colonoscopy, and pap smear. Declined office referrals today. She will make appointments that fit her schedule as she is an established patient with several specialists.     Type 2 DM [name] has a history of type 2 DM for several years. Current treatment includes Metformin 500 mg BID. Last labs on 10/29/19 reveal good control with HgbA1C 6.5.Home blood glucose checks range from 106-150. She states she has experienced a few hypoglycemic episodes when she inadvertently skips meals due to business with yard work. She performs diabetic foot checks daily after showering and has regular visits with podiatry. She consumes a heart healthy diet as much as possible.  Hyperlipidemia [name] has a history of hyperlipidemia. Current treatment is diet-control. Previous treatment is Pravachol. Last labs on 10/29/19 reveal good control with TC 171, Trig 168, HDL 51, and LDL 91.   Hypertension [name] has a history of hypertension for several years. Current treatment includes heart healthy diet. BP in office today 110/68. BP on last office visit on 04/18/20 was 108/60. Pt has history of MI per chart review. She is not currently seeing cardiology at this time. Denies chest pain, dyspnea, headaches, dizziness, or syncopal episodes.   GERD [name] has a past medical history of GERD for several years. Current treatment includes Prilosec 40 mg BID. She states she has experienced difficulty swallowing. She is followed by Dr  Melina Copa, GI. Has a history of esophageal dilation to improve swallowing. States she is due for f/u EGD/Colonoscopy. States she will contact Dr Carmie End office to make appointment.   Skin lesions Hannan has skin lesions to RLQ of abdomen and Right posterior upper torso that are non-healing.States lesion to RLQ abd has been present for several months and is concerning.Denies pain, swelling, or drainage from lesion.  She has agreed to dermatology for further evaluation.   Chronic pain syndrome [name] has chronic pain due to degenerative disk disease to lumbar area, bilateral foot problems, and fibromyalgia. She is followed by Dr Rennis Harding and Arlyss Queen with pain management clinic in [place]. Treatment includes Tramadol 50 mg and steroid injections as needed. Past Medical History:  Diagnosis Date  . Allergy   . Anxiety   . Arthritis   . Bradycardia, unspecified   . Bruises easily   . Calf pain    when walking  . Fibromyalgia   . Frequent headaches   . GERD (gastroesophageal reflux disease)   . H/O bladder problems   . Hypertension   . IBS (irritable bowel syndrome)   . Localized edema   . Mild persistent asthma, uncomplicated   . Morbid (severe) obesity due to excess calories ([place])   . Muscle pain   . Myocardial infarction ([place])   . Other hypotension   . Ovarian cyst   . Slow transit constipation   . Solitary pulmonary nodule   . Swelling    feet and legs  . Type 2 diabetes mellitus with diabetic nephropathy ([place])   . Varicose veins of bilateral lower  extremities with pain     Past Surgical History:  Procedure Laterality Date  . BUNIONECTOMY     left foot 5th toe  . CARDIAC CATHETERIZATION    . carpel tunnel surgery    . CESAREAN SECTION    . CHOLECYSTECTOMY    . GALLBLADDER SURGERY    . TONSILLECTOMY    . TUBAL LIGATION    . VAGINAL HYSTERECTOMY  2003   [facility]VH/SR    Family History  Problem Relation Age of Onset  . Heart disease Father   . Hypertension  Father   . Stroke Father   . Heart attack Father   . Cerebrovascular Accident Father   . Diabetes Sister   . Kidney disease Sister   . Migraines Daughter   . Cancer Maternal Aunt 74       breast    Social History   Socioeconomic History  . Marital status: Married    Spouse name: Not on file  . Number of children: 1  . Years of education: Not on file  . Highest education level: Not on file  Occupational History  . Occupation: Disabled due to foot problems since 1993  Tobacco Use  . Smoking status: Never Smoker  . Smokeless tobacco: Never Used  Vaping Use  . Vaping Use: Never used  Substance and Sexual Activity  . Alcohol use: No  . Drug use: No  . Sexual activity: Yes    Birth control/protection: Surgical    Comment: hyst  Other Topics Concern  . Not on file  Social History Narrative  . Not on file      Outpatient Medications Prior to Visit  Medication Sig Dispense Refill  . ALPRAZolam (XANAX) 0.5 MG tablet TAKE 1/2 TO 1 TABLET BY MOUTH EVERY 8 HOURS AS NEEDED. 30 tablet 5  . Biotin 10000 MCG TABS Take 1 tablet by mouth daily.    . Blood Glucose Monitoring Suppl (TRUE METRIX METER) w/Device KIT USE AS DIRECTED 1 kit 0  . diclofenac (VOLTAREN) 75 MG EC tablet Take 75 mg by mouth 2 (two) times daily.    . DULoxetine (CYMBALTA) 60 MG capsule Take 1 capsule (60 mg total) by mouth daily. 90 capsule 1  . furosemide (LASIX) 20 MG tablet TAKE 1 TABLET EVERY DAY AS NEEDED FOR EDEMA. 90 tablet 1  . glucose blood (TRUE METRIX BLOOD GLUCOSE TEST) test strip USE AS DIRECTED TO CHECK FASTING BLOOD SUGAR 300 strip 2  . loratadine (CLARITIN) 10 MG tablet Take 1 tablet (10 mg total) by mouth daily. 90 tablet 3  . meclizine (ANTIVERT) 25 MG tablet TAKE 1 TABLET TWICE DAILY AS NEEDED FOR DIZZINESS. 180 tablet 1  . metFORMIN (GLUCOPHAGE) 500 MG tablet TAKE 1 TABLET TWICE DAILY WITH MEALS 180 tablet 1  . omeprazole (PRILOSEC) 40 MG capsule Take 1 capsule (40 mg total) by mouth 2 (two)  times daily. 180 capsule 1  . oxybutynin (DITROPAN) 5 MG tablet Take 1 tablet (5 mg total) by mouth 3 (three) times daily. 270 tablet 0  . PROVENTIL HFA 108 (90 Base) MCG/ACT inhaler INHALE 2 PUFFS BY MOUTH FOUR TIMES A DAY AS NEEDED FOR WHEEZING 1 each 2  . spironolactone-hydrochlorothiazide (ALDACTAZIDE) 25-25 MG tablet TAKE 1 TABLET EVERY DAY 90 tablet 1  . traMADol (ULTRAM) 50 MG tablet Take 1 tablet (50 mg total) by mouth 3 (three) times daily as needed. 90 tablet 1  . TRUEplus Lancets 33G MISC E11.69 use new lancet each time when checking FBS  100 each 2   No facility-administered medications prior to visit.    Allergies  Allergen Reactions  . Morphine And Related Other (See Comments)    Woozy, "out of body experience"  . Celebrex [Celecoxib] Other (See Comments)    Patient does not remember intolerance  . Gabapentin Other (See Comments)    Confusion  . Lisinopril Cough  . Guaifenesin & Derivatives Other (See Comments)    Patient cannot remember specific issue, just made her feel "weird"  . Lipitor [Atorvastatin] Rash  . Sulfa Antibiotics Rash  . Valium [Diazepam] Other (See Comments)    Lightheaded, dizziness     ROS Review of Systems  Constitutional: Negative for appetite change, fatigue and unexpected weight change.  HENT: Negative for congestion, ear pain, rhinorrhea, sinus pressure, sinus pain and tinnitus.        Left internal ear itching  Eyes: Negative for pain.  Respiratory: Negative for cough and shortness of breath.   Cardiovascular: Negative for chest pain, palpitations and leg swelling.  Gastrointestinal: Negative for abdominal pain, constipation, diarrhea, nausea and vomiting.  Endocrine: Negative for cold intolerance, heat intolerance, polydipsia, polyphagia and polyuria.  Genitourinary: Negative for dysuria, frequency and hematuria.  Musculoskeletal: Positive for arthralgias (chronic), back pain (chronic) and myalgias (chronic). Negative for joint swelling.        Chronic pain syndrome  Skin: Negative for rash.  Allergic/Immunologic: Negative for environmental allergies.  Neurological: Negative for dizziness and headaches.  Hematological: Negative for adenopathy.  Psychiatric/Behavioral: Negative for decreased concentration and sleep disturbance. The patient is not nervous/anxious.       Objective:    Physical Exam Vitals reviewed.  Constitutional:      Appearance: Normal appearance.  HENT:     Head: Normocephalic.     Right Ear: Tympanic membrane normal.     Left Ear: A middle ear effusion is present.     Nose: Nose normal.     Mouth/Throat:     Mouth: Mucous membranes are moist.  Neck:     Vascular: No carotid bruit.  Cardiovascular:     Rate and Rhythm: Normal rate and regular rhythm.     Pulses: Normal pulses.     Heart sounds: Normal heart sounds.  Pulmonary:     Effort: Pulmonary effort is normal.     Breath sounds: Normal breath sounds.  Abdominal:     General: Bowel sounds are normal.     Palpations: Abdomen is soft.     Tenderness: There is no abdominal tenderness. There is no guarding.  Musculoskeletal:        General: No swelling.  Skin:    General: Skin is warm and dry.     Capillary Refill: Capillary refill takes less than 2 seconds.     Findings: Lesion present.       Neurological:     Mental Status: She is alert and oriented to person, place, and time.  Psychiatric:        Mood and Affect: Mood normal.        Behavior: Behavior normal.     BP 110/68 (BP Location: Left Arm, Patient Position: Sitting)   Pulse 68   Temp 97.6 F (36.4 C) (Temporal)   Ht _0  (1.575 m)   Wt 208 lb (94.3 kg)   SpO2 96%   BMI 38.04 kg/m  Wt Readings from Last 3 Encounters:  04/28/20 208 lb (94.3 kg)  04/18/20 208 lb (94.3 kg)  01/06/20 208 lb 6.4 oz (94.5  kg)     Health Maintenance Due  Topic Date Due  . Hepatitis C Screening  Never done  . HIV Screening  Never done  . PAP SMEAR-Modifier  01/22/2016  .  COLONOSCOPY (Pts 45-66yr Insurance coverage will need to be confirmed)  05/14/2016  . OPHTHALMOLOGY EXAM  09/18/2019  . FOOT EXAM  12/10/2019  . COVID-19 Vaccine (3 - Booster for Moderna series) 02/23/2020  . HEMOGLOBIN A1C  04/28/2020  . URINE MICROALBUMIN  04/29/2020      Lab Results  Component Value Date   TSH 1.470 10/29/2019   Lab Results  Component Value Date   WBC 12.6 (H) 10/29/2019   HGB 14.5 10/29/2019   HCT 43.4 10/29/2019   MCV 92 10/29/2019   PLT 497 (H) 10/29/2019   Lab Results  Component Value Date   NA 137 10/29/2019   K 4.2 10/29/2019   CO2 26 10/29/2019   GLUCOSE 107 (H) 10/29/2019   BUN 13 10/29/2019   CREATININE 0.86 10/29/2019   BILITOT 0.7 10/29/2019   ALKPHOS 87 10/29/2019   AST 15 10/29/2019   ALT 17 10/29/2019   PROT 6.6 10/29/2019   ALBUMIN 4.2 10/29/2019   CALCIUM 9.8 10/29/2019   Lab Results  Component Value Date   CHOL 171 10/29/2019   Lab Results  Component Value Date   HDL 51 10/29/2019   Lab Results  Component Value Date   LDLCALC 91 10/29/2019   Lab Results  Component Value Date   TRIG 168 (H) 10/29/2019   Lab Results  Component Value Date   CHOLHDL 3.4 10/29/2019   Lab Results  Component Value Date   HGBA1C 6.5 (H) 10/29/2019      Assessment & Plan:   1. Essential hypertension - CBC with Differential/Platelet - Comprehensive metabolic panel -Heart healthy diet  2. Gastroesophageal reflux disease without esophagitis -Continue Prilosec 40 mg BID -Follow-up with Dr BMelina Copa GI   3. Mixed diabetic hyperlipidemia associated with type 2 diabetes mellitus (H[facility]) - Lipid panel - Hemoglobin A1c -Continue Metformin 500 mg BID  -Heart healthy/low-carb diet  4. Pigmented skin lesion of uncertain behavior of torso - Ambulatory referral to Dermatology  5. Chronic pain syndrome  -Continue follow-up with pain management as scheduled   Continue all medication We will call you with lab results and dermatology  appointment Recommend diabetic eye exam, pap smear, and screening colonoscopy (Pt will schedule) Fasting follow-up in 339-month   Follow-up: 3-15-monthsting  Signed, ShaRip HarbourP

## 2020-04-29 LAB — COMPREHENSIVE METABOLIC PANEL
ALT: 22 IU/L (ref 0–32)
AST: 22 IU/L (ref 0–40)
Albumin/Globulin Ratio: 2 (ref 1.2–2.2)
Albumin: 4.5 g/dL (ref 3.8–4.8)
Alkaline Phosphatase: 75 IU/L (ref 44–121)
BUN/Creatinine Ratio: 13 (ref 12–28)
BUN: 10 mg/dL (ref 8–27)
Bilirubin Total: 0.7 mg/dL (ref 0.0–1.2)
CO2: 27 mmol/L (ref 20–29)
Calcium: 9.9 mg/dL (ref 8.7–10.3)
Chloride: 94 mmol/L — ABNORMAL LOW (ref 96–106)
Creatinine, Ser: 0.76 mg/dL (ref 0.57–1.00)
Globulin, Total: 2.2 g/dL (ref 1.5–4.5)
Glucose: 106 mg/dL — ABNORMAL HIGH (ref 65–99)
Potassium: 3.9 mmol/L (ref 3.5–5.2)
Sodium: 138 mmol/L (ref 134–144)
Total Protein: 6.7 g/dL (ref 6.0–8.5)
eGFR: 89 mL/min/{1.73_m2} (ref >59)

## 2020-04-29 LAB — CBC WITH DIFFERENTIAL/PLATELET
Basophils Absolute: 0.1 10*3/uL (ref 0.0–0.2)
Basos: 1 %
EOS (ABSOLUTE): 0.2 10*3/uL (ref 0.0–0.4)
Eos: 2 %
Hematocrit: 41.9 % (ref 34.0–46.6)
Hemoglobin: 13.6 g/dL (ref 11.1–15.9)
Immature Grans (Abs): 0.2 10*3/uL — ABNORMAL HIGH (ref 0.0–0.1)
Immature Granulocytes: 2 %
Lymphocytes Absolute: 3.3 10*3/uL — ABNORMAL HIGH (ref 0.7–3.1)
Lymphs: 30 %
MCH: 29.4 pg (ref 26.6–33.0)
MCHC: 32.5 g/dL (ref 31.5–35.7)
MCV: 91 fL (ref 79–97)
Monocytes Absolute: 1.1 10*3/uL — ABNORMAL HIGH (ref 0.1–0.9)
Monocytes: 10 %
Neutrophils Absolute: 6.2 10*3/uL (ref 1.4–7.0)
Neutrophils: 55 %
Platelets: 487 10*3/uL — ABNORMAL HIGH (ref 150–450)
RBC: 4.63 x10E6/uL (ref 3.77–5.28)
RDW: 13.6 % (ref 11.7–15.4)
WBC: 11.1 10*3/uL — ABNORMAL HIGH (ref 3.4–10.8)

## 2020-04-29 LAB — LIPID PANEL
Chol/HDL Ratio: 4.5 ratio — ABNORMAL HIGH (ref 0.0–4.4)
Cholesterol, Total: 200 mg/dL — ABNORMAL HIGH (ref 100–199)
HDL: 44 mg/dL (ref >39)
LDL Chol Calc (NIH): 123 mg/dL — ABNORMAL HIGH (ref 0–99)
Triglycerides: 186 mg/dL — ABNORMAL HIGH (ref 0–149)
VLDL Cholesterol Cal: 33 mg/dL (ref 5–40)

## 2020-04-29 LAB — HEMOGLOBIN A1C
Est. average glucose Bld gHb Est-mCnc: 143 mg/dL
Hgb A1c MFr Bld: 6.6 % — ABNORMAL HIGH (ref 4.8–5.6)

## 2020-04-29 LAB — CARDIOVASCULAR RISK ASSESSMENT

## 2020-05-01 ENCOUNTER — Telehealth: Payer: Self-pay

## 2020-05-01 NOTE — Progress Notes (Signed)
Chronic Care Management Pharmacy Assistant   Name: PEBBLES ZEIDERS  MRN: [id number] DOB: 11/18/1955  Andrea White is an 65 y.o. year old female who presents for his initial CCM visit with the clinical pharmacist.  Reason for Encounter: Initial call questions    Medications: Outpatient Encounter Medications as of 05/01/2020  Medication Sig  . ALPRAZolam (XANAX) 0.5 MG tablet TAKE 1/2 TO 1 TABLET BY MOUTH EVERY 8 HOURS AS NEEDED.  Marland Kitchen Biotin 10000 MCG TABS Take 1 tablet by mouth daily.  . Blood Glucose Monitoring Suppl (TRUE METRIX METER) w/Device KIT USE AS DIRECTED  . diclofenac (VOLTAREN) 75 MG EC tablet Take 75 mg by mouth 2 (two) times daily.  . DULoxetine (CYMBALTA) 60 MG capsule Take 1 capsule (60 mg total) by mouth daily.  Marland Kitchen glucose blood (TRUE METRIX BLOOD GLUCOSE TEST) test strip USE AS DIRECTED TO CHECK FASTING BLOOD SUGAR  . loratadine (CLARITIN) 10 MG tablet Take 1 tablet (10 mg total) by mouth daily.  . meclizine (ANTIVERT) 25 MG tablet TAKE 1 TABLET TWICE DAILY AS NEEDED FOR DIZZINESS.  . metFORMIN (GLUCOPHAGE) 500 MG tablet TAKE 1 TABLET TWICE DAILY WITH MEALS  . omeprazole (PRILOSEC) 40 MG capsule Take 1 capsule (40 mg total) by mouth 2 (two) times daily.  Marland Kitchen oxybutynin (DITROPAN) 5 MG tablet Take 1 tablet (5 mg total) by mouth 3 (three) times daily.  Marland Kitchen PROVENTIL HFA 108 (90 Base) MCG/ACT inhaler INHALE 2 PUFFS BY MOUTH FOUR TIMES A DAY AS NEEDED FOR WHEEZING  . spironolactone-hydrochlorothiazide (ALDACTAZIDE) 25-25 MG tablet TAKE 1 TABLET EVERY DAY  . traMADol (ULTRAM) 50 MG tablet Take 1 tablet (50 mg total) by mouth 3 (three) times daily as needed.  . TRUEplus Lancets 33G MISC E11.69 use new lancet each time when checking FBS   No facility-administered encounter medications on file as of 05/01/2020.   Have you seen any other providers since your last visit?  Patient stated that she has not seen any other providers since her last visit.  Any changes in your  medications or health?  Patient stated that she has not had any changes in medications or health.   Any side effects from any medications?  Patient reported no side effects from any of her current medications.   Do you have an symptoms or problems not managed by your medications?  Patient reported that she is not experiencing any symptoms or problems not being managed by her medications.  Any concerns about your health right now?   Patient stated that she does not have any concerns about her health right now.   Has your provider asked that you check blood pressure, blood sugar, or follow special diet at home?  Patient stated that she does not check her blood pressure, blood sugar or follow any special diet at home.  Do you get any type of exercise on a regular basis?  [place] ,pulls weeds, walks her dogs, and mows the lawn on a riding mower, so she reported staying active on a daily basis.   Can you think of a goal you would like to reach for your health?  Patient reported that she does not have any goals for her health at this time.  Do you have any problems getting your medications?  Patient reported that she does not have problems getting her medications. However, she is concerned about being able to afford her Albuterol  because her husband found out recently that the cost is going up and they are on the same health coverage plan.    Is there anything that you would like to discuss during the appointment?   Patient reported that she would appreciate help getting patient assistance with Albuterol. I assured her that we will help her in that process.    Please bring medications and supplements to appointment   Marcine Matar, [facility] Clinical Pharmacist Assistant

## 2020-05-03 ENCOUNTER — Other Ambulatory Visit: Payer: Medicare HMO

## 2020-05-04 ENCOUNTER — Ambulatory Visit (INDEPENDENT_AMBULATORY_CARE_PROVIDER_SITE_OTHER): Payer: Medicare HMO

## 2020-05-04 ENCOUNTER — Other Ambulatory Visit: Payer: Self-pay

## 2020-05-04 ENCOUNTER — Telehealth: Payer: Self-pay

## 2020-05-04 DIAGNOSIS — I10 Essential (primary) hypertension: Secondary | ICD-10-CM | POA: Diagnosis not present

## 2020-05-04 DIAGNOSIS — E1169 Type 2 diabetes mellitus with other specified complication: Secondary | ICD-10-CM | POA: Diagnosis not present

## 2020-05-04 DIAGNOSIS — M791 Myalgia, unspecified site: Secondary | ICD-10-CM

## 2020-05-04 DIAGNOSIS — K219 Gastro-esophageal reflux disease without esophagitis: Secondary | ICD-10-CM

## 2020-05-04 DIAGNOSIS — E782 Mixed hyperlipidemia: Secondary | ICD-10-CM

## 2020-05-04 NOTE — Progress Notes (Signed)
    Chronic Care Management Pharmacy Assistant   Name: Andrea White  MRN: [id number] DOB: 05/17/1955  Reason for Encounter: Medication Review for Proventil PAP status    Medications: Outpatient Encounter Medications as of 05/04/2020  Medication Sig  . ALPRAZolam (XANAX) 0.5 MG tablet TAKE 1/2 TO 1 TABLET BY MOUTH EVERY 8 HOURS AS NEEDED.  Marland Kitchen Biotin 10000 MCG TABS Take 1 tablet by mouth daily.  . Blood Glucose Monitoring Suppl (TRUE METRIX METER) w/Device KIT USE AS DIRECTED  . diclofenac (VOLTAREN) 75 MG EC tablet Take 75 mg by mouth 2 (two) times daily.  . DULoxetine (CYMBALTA) 60 MG capsule Take 1 capsule (60 mg total) by mouth daily.  Marland Kitchen glucose blood (TRUE METRIX BLOOD GLUCOSE TEST) test strip USE AS DIRECTED TO CHECK FASTING BLOOD SUGAR  . loratadine (CLARITIN) 10 MG tablet Take 1 tablet (10 mg total) by mouth daily.  . meclizine (ANTIVERT) 25 MG tablet TAKE 1 TABLET TWICE DAILY AS NEEDED FOR DIZZINESS.  . metFORMIN (GLUCOPHAGE) 500 MG tablet TAKE 1 TABLET TWICE DAILY WITH MEALS  . omeprazole (PRILOSEC) 40 MG capsule Take 1 capsule (40 mg total) by mouth 2 (two) times daily.  Marland Kitchen oxybutynin (DITROPAN) 5 MG tablet Take 1 tablet (5 mg total) by mouth 3 (three) times daily.  Marland Kitchen PROVENTIL HFA 108 (90 Base) MCG/ACT inhaler INHALE 2 PUFFS BY MOUTH FOUR TIMES A DAY AS NEEDED FOR WHEEZING  . spironolactone-hydrochlorothiazide (ALDACTAZIDE) 25-25 MG tablet TAKE 1 TABLET EVERY DAY  . traMADol (ULTRAM) 50 MG tablet Take 1 tablet (50 mg total) by mouth 3 (three) times daily as needed.  . TRUEplus Lancets 33G MISC E11.69 use new lancet each time when checking FBS   No facility-administered encounter medications on file as of 05/04/2020.    Donette Larry, CPP, wanted me to call [employer] to check on the status of why the patients application for Proventil was denied.  I called [employer], the representative stated this medication is no longer covered under [employer], that is why the patient received a denial  letter.  I have notified Donette Larry, CPP.    I looked up the medication on [employer], and sent Donette Larry, CPP the information that I found out.  Clarita Leber, [facility] Pharmacist Assistant [phone]

## 2020-05-04 NOTE — Patient Instructions (Addendum)
Visit Information  Thank you for your time discussing your medications. I look forward to working with you to achieve your health care goals. Below is a summary of what we talked about during our visit.   Goals Addressed            This Visit's Progress   . Learn More About My Health       Timeframe:  Long-Range Goal Priority:  High Start Date:                             Expected End Date:                        Follow Up Date 04/11/2021    - tell my story and reason for my visit - repeat what I heard to make sure I understand - bring a list of my medicines to the visit    Why is this important?    The best way to learn about your health and care is by talking to the doctor and nurse.   They will answer your questions and give you information in the way that you like best.    Notes:     Marland Kitchen Manage My Medicine       Timeframe:  Long-Range Goal Priority:  High Start Date:                             Expected End Date:                       Follow Up Date 04/11/2021    - call for medicine refill 2 or 3 days before it runs out - keep a list of all the medicines I take; vitamins and herbals too - use a pillbox to sort medicine    Why is this important?   . These steps will help you keep on track with your medicines.   Notes:     Marland Kitchen Monitor and Manage My Blood Sugar-Diabetes Type 2       Timeframe:  Long-Range Goal Priority:  High Start Date:                             Expected End Date:                       Follow Up Date 04/11/2021    - check blood sugar at prescribed times - take the blood sugar log to all doctor visits    Why is this important?    Checking your blood sugar at home helps to keep it from getting very high or very low.   Writing the results in a diary or log helps the doctor know how to care for you.   Your blood sugar log should have the time, date and the results.   Also, write down the amount of insulin or other medicine that you take.    Other information, like what you ate, exercise done and how you were feeling, will also be helpful.     Notes:        Patient Care Plan: CCM Pharmacy Care Plan    Problem Identified: htn, hld, dm   Priority: High  Onset Date: 05/04/2020    Long-Range Goal: Disease Management  Start Date: 05/04/2020  Expected End Date: 05/04/2021  This Visit's Progress: On track  Priority: High  Note:    Current Barriers:  . Unable to independently afford treatment regimen  Pharmacist Clinical Goal(s):  Marland Kitchen Patient will verbalize ability to afford treatment regimen through collaboration with PharmD and provider.    Interventions: . 1:1 collaboration with Andrea Brome, MD regarding development and update of comprehensive plan of care as evidenced by provider attestation and co-signature . Inter-disciplinary care team collaboration (see longitudinal plan of care) . Comprehensive medication review performed; medication list updated in electronic medical record  Hypertension (BP goal <130/80) -Controlled -Current treatment: . Spironolactone-hydrochlorothiazide 25-25 mg daily - patient was advised to quit medication by Dr. Tobie Poet but has not at this time. Plans to stop and monitor blood pressure.  -Medications previously tried: lisinopril, furosemide  -Current home readings: well controlled  -Current dietary habits: patient reports she eats what she wants. Eats fast food.  -Current exercise habits: works in yard but back pain limits formal exercise.  -Denies hypotensive/hypertensive symptoms -Educated on BP goals and benefits of medications for prevention of heart attack, stroke and kidney damage; Daily salt intake goal < 2300 mg; Exercise goal of 150 minutes per week; -Counseled to monitor BP at home during transition of stopping spironolactone-hydrochlorothiazide, document, and provide log at future appointments -Counseled on diet and exercise extensively Recommended monitoring blood pressure  while stopping spironolactone-hydrochlorothiazide like Dr. Tobie Poet previously recommended.   Hyperlipidemia: (LDL goal < 55) -Uncontrolled -Current treatment: . Pravastatin 20 mg daily  -Medications previously tried:  lipitor (rash) -Current dietary patterns: eats fast food and what she wants per patient.  -Current exercise habits: works in yard -Educated on [employer];  Benefits of statin for ASCVD risk reduction; Importance of limiting foods high in cholesterol; -Counseled on diet and exercise extensively Recommended to continue current medication  Diabetes (A1c goal <7%) -Controlled -Current medications: . metformin 500 mg bid with meals . True Metrix Meter testing supplies -Medications previously tried: none reported  -Current home glucose readings . fasting glucose: 109, 111-121    . post prandial glucose: none reported -Denies hypoglycemic/hyperglycemic symptoms -Current meal patterns:  . breakfast: cereal (Frosted shredded wheat), bacon/eggs/grits/toast, or carnation instant breakfast  . Lunch and dinner: fast food,  corn dog and fries -Current exercise: works in yard -Educated on A1c and blood sugar goals; Complications of diabetes including kidney damage, retinal damage, and cardiovascular disease; -Counseled to check feet daily and get yearly eye exams -Counseled on diet and exercise extensively Recommended to continue current medication  Anxiety/Depression (Goal: manage symptoms) -Controlled -Current treatment  . duloxetine 60 mg daily  . Alprazolam 0.5 mg 1/2-1 tablet by mouth every 8 hours prn  -Medications previously tried: none rpeorted  -Recommended to continue current medication  GERD (Goal: manage symptoms of GERD) -Controlled -Current treatment  . omeprazole 40 mg bid -Medications previously tried: none reported  -Counseled on diet and exercise extensively Recommended to continue current medication  Pain  (Goal: manage pain  ) -Controlled -Current treatment  . volataren 75 mg bid  . voltaren gel applied topically . Tramadol 50 mg tid prn  -Medications previously tried: Celebrex  -Recommended to continue current medication  Overactive Bladder (Goal: manage symptoms of OAB) -Controlled -Current treatment  . oxybutynin 5 mg bid  -Medications previously tried: none reported  -Recommended to continue current medication  Health Maintenance -Vaccine gaps: Third COVID shot -not interested in any more. shingrix - has not had and doesn't want -  Current therapy:  . biotin 10,000 mcg daily  . Meclizine 25 mg bid prn dizziness . Apple cider vinegar gummy daily . Multivitamin daily  -Educated on Cost vs benefit of each product must be carefully weighed by individual consumer -Patient is satisfied with current therapy and denies issues -Counseled on diet and exercise extensively Recommended to continue current medication   Patient Goals/Self-Care Activities . Patient will:  - take medications as prescribed focus on medication adherence by using pill box check glucose daily , document, and provide at future appointments engage in dietary modifications by reducing fried/fatty foods and carbohydrate/sugar consumption.   Follow Up Plan: Telephone follow up appointment with care management team member scheduled for: 03/2021      Andrea White was given information about Chronic Care Management services today including:  1. CCM service includes personalized support from designated clinical staff supervised by her physician, including individualized plan of care and coordination with other care providers 2. 24/7 contact phone numbers for assistance for urgent and routine care needs. 3. Standard insurance, coinsurance, copays and deductibles apply for chronic care management only during months in which we provide at least 20 minutes of these services. Most insurances cover these services at 100%, however patients may be  responsible for any copay, coinsurance and/or deductible if applicable. This service may help you avoid the need for more expensive face-to-face services. 4. Only one practitioner may furnish and bill the service in a calendar month. 5. The patient may stop CCM services at any time (effective at the end of the month) by phone call to the office staff.  Patient agreed to services and verbal consent obtained.   The patient verbalized understanding of instructions, educational materials, and care plan provided today and declined offer to receive copy of patient instructions, educational materials, and care plan.  Telephone follow up appointment with pharmacy team member scheduled for: 03/2021  Sherre Poot, PharmD Clinical Pharmacist [facility] [phone] (office) [phone] (mobile)  [email or web address]">  DASH Eating Plan DASH stands for Dietary Approaches to Stop Hypertension. The DASH eating plan is a healthy eating plan that has been shown to:  Reduce high blood pressure (hypertension).  Reduce your risk for type 2 diabetes, heart disease, and stroke.  Help with weight loss. What are tips for following this plan? Reading food labels  Check food labels for the amount of salt (sodium) per serving. Choose foods with less than 5 percent of the Daily Value of sodium. Generally, foods with less than 300 milligrams (mg) of sodium per serving fit into this eating plan.  To find whole grains, look for the word "whole" as the first word in the ingredient list. Shopping  Buy products labeled as "low-sodium" or "no salt added."  Buy fresh foods. Avoid canned foods and pre-made or frozen meals. Cooking  Avoid adding salt when cooking. Use salt-free seasonings or herbs instead of table salt or sea salt. Check with your health care provider or pharmacist before using salt substitutes.  Do not fry foods. Cook foods using healthy  methods such as baking, boiling, grilling, roasting, and broiling instead.  Cook with heart-healthy oils, such as olive, canola, avocado, soybean, or sunflower oil. Meal planning  Eat a balanced diet that includes: ? 4 or more servings of fruits and 4 or more servings of vegetables each day. Try to fill one-half of your plate with fruits and vegetables. ? 6-8 servings of whole grains each day. ? Less than 6 oz (170 g) of  lean meat, poultry, or fish each day. A 3-oz (85-g) serving of meat is about the same size as a deck of cards. One egg equals 1 oz (28 g). ? 2-3 servings of low-fat dairy each day. One serving is 1 cup (237 mL). ? 1 serving of nuts, seeds, or beans 5 times each week. ? 2-3 servings of heart-healthy fats. Healthy fats called omega-3 fatty acids are found in foods such as walnuts, flaxseeds, fortified milks, and eggs. These fats are also found in cold-water fish, such as sardines, salmon, and mackerel.  Limit how much you eat of: ? Canned or prepackaged foods. ? Food that is high in trans fat, such as some fried foods. ? Food that is high in saturated fat, such as fatty meat. ? Desserts and other sweets, sugary drinks, and other foods with added sugar. ? Full-fat dairy products.  Do not salt foods before eating.  Do not eat more than 4 egg yolks a week.  Try to eat at least 2 vegetarian meals a week.  Eat more home-cooked food and less restaurant, buffet, and fast food.   Lifestyle  When eating at a restaurant, ask that your food be prepared with less salt or no salt, if possible.  If you drink alcohol: ? Limit how much you use to:  0-1 drink a day for women who are not pregnant.  0-2 drinks a day for men. ? Be aware of how much alcohol is in your drink. In the [place] one drink equals one 12 oz bottle of beer (355 mL), one 5 oz glass of wine (148 mL), or one 1 oz glass of hard liquor (44 mL). General information  Avoid eating more than 2,300 mg of salt a day. If  you have hypertension, you may need to reduce your sodium intake to 1,500 mg a day.  Work with your health care provider to maintain a healthy body weight or to lose weight. Ask what an ideal weight is for you.  Get at least 30 minutes of exercise that causes your heart to beat faster (aerobic exercise) most days of the week. Activities may include walking, swimming, or biking.  Work with your health care provider or dietitian to adjust your eating plan to your individual calorie needs. What foods should I eat? Fruits All fresh, dried, or frozen fruit. Canned fruit in natural juice (without added sugar). Vegetables Fresh or frozen vegetables (raw, steamed, roasted, or grilled). Low-sodium or reduced-sodium tomato and vegetable juice. Low-sodium or reduced-sodium tomato sauce and tomato paste. Low-sodium or reduced-sodium canned vegetables. Grains Whole-grain or whole-wheat bread. Whole-grain or whole-wheat pasta. Lovelle Deitrick rice. [place] Morrow. Bulgur. Whole-grain and low-sodium cereals. Pita bread. Low-fat, low-sodium crackers. Whole-wheat flour tortillas. Meats and other proteins Skinless chicken or [place]. Ground chicken or [place]. Pork with fat trimmed off. Fish and seafood. Egg whites. Dried beans, peas, or lentils. Unsalted nuts, nut butters, and seeds. Unsalted canned beans. Lean cuts of beef with fat trimmed off. Low-sodium, lean precooked or cured meat, such as sausages or meat loaves. Dairy Low-fat (1%) or fat-free (skim) milk. Reduced-fat, low-fat, or fat-free cheeses. Nonfat, low-sodium ricotta or cottage cheese. Low-fat or nonfat yogurt. Low-fat, low-sodium cheese. Fats and oils Soft margarine without trans fats. Vegetable oil. Reduced-fat, low-fat, or light mayonnaise and salad dressings (reduced-sodium). Canola, safflower, olive, avocado, soybean, and sunflower oils. Avocado. Seasonings and condiments Herbs. Spices. Seasoning mixes without salt. Other foods Unsalted popcorn and  pretzels. Fat-free sweets. The items listed above may not be  a complete list of foods and beverages you can eat. Contact a dietitian for more information. What foods should I avoid? Fruits Canned fruit in a light or heavy syrup. Fried fruit. Fruit in cream or butter sauce. Vegetables Creamed or fried vegetables. Vegetables in a cheese sauce. Regular canned vegetables (not low-sodium or reduced-sodium). Regular canned tomato sauce and paste (not low-sodium or reduced-sodium). Regular tomato and vegetable juice (not low-sodium or reduced-sodium). Angie Fava. Olives. Grains Baked goods made with fat, such as croissants, muffins, or some breads. Dry pasta or rice meal packs. Meats and other proteins Fatty cuts of meat. Ribs. Fried meat. Berniece Salines. Bologna, salami, and other precooked or cured meats, such as sausages or meat loaves. Fat from the back of a pig (fatback). Bratwurst. Salted nuts and seeds. Canned beans with added salt. Canned or smoked fish. Whole eggs or egg yolks. Chicken or [place] with skin. Dairy Whole or 2% milk, cream, and half-and-half. Whole or full-fat cream cheese. Whole-fat or sweetened yogurt. Full-fat cheese. Nondairy creamers. Whipped toppings. Processed cheese and cheese spreads. Fats and oils Butter. Stick margarine. Lard. Shortening. Ghee. Bacon fat. Tropical oils, such as coconut, palm kernel, or palm oil. Seasonings and condiments Onion salt, garlic salt, seasoned salt, table salt, and sea salt. Worcestershire sauce. Tartar sauce. Barbecue sauce. Teriyaki sauce. Soy sauce, including reduced-sodium. Steak sauce. Canned and packaged gravies. Fish sauce. Oyster sauce. Cocktail sauce. Store-bought horseradish. Ketchup. Mustard. Meat flavorings and tenderizers. Bouillon cubes. Hot sauces. Pre-made or packaged marinades. Pre-made or packaged taco seasonings. Relishes. Regular salad dressings. Other foods Salted popcorn and pretzels. The items listed above may not be a complete list  of foods and beverages you should avoid. Contact a dietitian for more information. Where to find more information  National Heart, Lung, and Blood Institute: https://wilson-eaton.com/  American Heart Association: www.heart.org  Academy of Nutrition and Dietetics: www.eatright.[place]: www.kidney.org Summary  The DASH eating plan is a healthy eating plan that has been shown to reduce high blood pressure (hypertension). It may also reduce your risk for type 2 diabetes, heart disease, and stroke.  When on the DASH eating plan, aim to eat more fresh fruits and vegetables, whole grains, lean proteins, low-fat dairy, and heart-healthy fats.  With the DASH eating plan, you should limit salt (sodium) intake to 2,300 mg a day. If you have hypertension, you may need to reduce your sodium intake to 1,500 mg a day.  Work with your health care provider or dietitian to adjust your eating plan to your individual calorie needs. This information is not intended to replace advice given to you by your health care provider. Make sure you discuss any questions you have with your health care provider. Document Revised: 12/11/2018 Document Reviewed: 12/11/2018 Elsevier Patient Education  2021 [employer].

## 2020-05-05 ENCOUNTER — Telehealth: Payer: Self-pay

## 2020-05-05 NOTE — Chronic Care Management (AMB) (Addendum)
Opened in error

## 2020-05-11 ENCOUNTER — Other Ambulatory Visit: Payer: Self-pay | Admitting: Family Medicine

## 2020-05-11 DIAGNOSIS — D1801 Hemangioma of skin and subcutaneous tissue: Secondary | ICD-10-CM | POA: Diagnosis not present

## 2020-05-11 DIAGNOSIS — L821 Other seborrheic keratosis: Secondary | ICD-10-CM | POA: Diagnosis not present

## 2020-05-11 DIAGNOSIS — D225 Melanocytic nevi of trunk: Secondary | ICD-10-CM | POA: Diagnosis not present

## 2020-05-22 ENCOUNTER — Telehealth: Payer: Self-pay

## 2020-05-22 NOTE — Progress Notes (Signed)
Chronic Care Management Pharmacy Assistant   Name: Andrea White  MRN: [id number] DOB: [birth year]  Andrea White is an 65 y.o. year old female who presents for his follow-up CCM visit with the clinical pharmacist.  Reason for Encounter: Adherence call for HTN                Recent office visits:  No recent office visits noted since last CPP visit.    Recent consult visits:  No recent consult visits noted since last CPP visit.   Hospital visits:  No recent hospital/ ED visits noted since last CPP visit.   Medications: Outpatient Encounter Medications as of 05/22/2020  Medication Sig  . ALPRAZolam (XANAX) 0.5 MG tablet TAKE 1/2 TO 1 TABLET BY MOUTH EVERY 8 HOURS AS NEEDED.  Marland Kitchen APPLE CIDER VINEGAR PO Take by mouth daily.  . Biotin 10000 MCG TABS Take 1 tablet by mouth daily.  . Blood Glucose Monitoring Suppl (TRUE METRIX METER) w/Device KIT USE AS DIRECTED  . diclofenac (VOLTAREN) 75 MG EC tablet Take 75 mg by mouth 2 (two) times daily.  . diclofenac Sodium (VOLTAREN) 1 % GEL Apply topically 4 (four) times daily as needed.  . DULoxetine (CYMBALTA) 60 MG capsule Take 1 capsule (60 mg total) by mouth daily.  Marland Kitchen glucose blood (TRUE METRIX BLOOD GLUCOSE TEST) test strip USE AS DIRECTED TO CHECK FASTING BLOOD SUGAR  . loratadine (CLARITIN) 10 MG tablet Take 1 tablet (10 mg total) by mouth daily.  . meclizine (ANTIVERT) 25 MG tablet TAKE 1 TABLET TWICE DAILY AS NEEDED FOR DIZZINESS.  . metFORMIN (GLUCOPHAGE) 500 MG tablet TAKE 1 TABLET TWICE DAILY WITH MEALS  . Multiple Vitamin (MULTIVITAMIN) tablet Take 1 tablet by mouth daily.  Marland Kitchen omeprazole (PRILOSEC) 40 MG capsule TAKE 1 CAPSULE (40 MG TOTAL) BY MOUTH 2 (TWO) TIMES DAILY.  Marland Kitchen oxybutynin (DITROPAN) 5 MG tablet Take 1 tablet (5 mg total) by mouth 3 (three) times daily. (Patient taking differently: Take 5 mg by mouth 2 (two) times daily.)  . pravastatin (PRAVACHOL) 20 MG tablet Take 20 mg by mouth daily.  Marland Kitchen PROVENTIL HFA 108 (90 Base)  MCG/ACT inhaler INHALE 2 PUFFS BY MOUTH FOUR TIMES A DAY AS NEEDED FOR WHEEZING  . spironolactone-hydrochlorothiazide (ALDACTAZIDE) 25-25 MG tablet TAKE 1 TABLET EVERY DAY (Patient not taking: Reported on 05/04/2020)  . traMADol (ULTRAM) 50 MG tablet Take 1 tablet (50 mg total) by mouth 3 (three) times daily as needed.  . TRUEplus Lancets 33G MISC E11.69 use new lancet each time when checking FBS   No facility-administered encounter medications on file as of 05/22/2020.    Recent Office Vitals: BP Readings from Last 3 Encounters:  04/28/20 110/68  04/18/20 108/60  01/06/20 106/72   Pulse Readings from Last 3 Encounters:  04/28/20 68  04/18/20 87  01/06/20 88    Wt Readings from Last 3 Encounters:  04/28/20 208 lb (94.3 kg)  04/18/20 208 lb (94.3 kg)  01/06/20 208 lb 6.4 oz (94.5 kg)     Kidney Function Lab Results  Component Value Date/Time   CREATININE 0.76 04/28/2020 08:47 AM   CREATININE 0.86 10/29/2019 08:50 AM   GFRNONAA 73 10/29/2019 08:50 AM   GFRAA 84 10/29/2019 08:50 AM    BMP Latest Ref Rng & Units 04/28/2020 10/29/2019 04/30/2019  Glucose 65 - 99 mg/dL 106(H) 107(H) 100(H)  BUN 8 - 27 mg/dL $Remove'10 13 13  'HOLuDXN$ Creatinine 0.57 - 1.00 mg/dL 0.76 0.86 0.87  BUN/Creat Ratio  12 - 28 13 15 15   Sodium 134 - 144 mmol/L 138 137 143  Potassium 3.5 - 5.2 mmol/L 3.9 4.2 4.0  Chloride 96 - 106 mmol/L 94(L) 94(L) 103  CO2 20 - 29 mmol/L 27 26 25   Calcium 8.7 - 10.3 mg/dL 9.9 9.8 9.6     Current antihypertensive regimen:  No current medications noted. Patient not longer taking spironolactone-hctz 25-25mg . Tablets.   Patient stated that she has been feeling great, and has not been checking her blood pressure. She reported that she only takes it if she is not feeling well. She recently discontinued Spironolactone-HCTZ, but stated that she has not monitored her BP since stopping medication.    What diet changes have been made to improve Blood Pressure Control?  Patient stated that she  has made no dietary changes. She reports that she enjoys fast food, and "eats whatever" she wants.   What exercise is being done to improve your Blood Pressure Control?  Patient stated that she works in the yard a little, but does not get any formal, consistent exercise.    Adherence Review: Is the patient currently on ACE/ARB medication? No  Patient stated that she no longer takes Spiro-HCTZ because her BP was running too low.   Does the patient have >5 day gap between last estimated fill dates? Yes/ CPP to review   Star Rating Drugs:  Medication:  Last Fill: Day Supply Metformin 500 mg.  02/12/2020 90DS Pravastatin 20 mg. 02/09/2020 [place], [place] Clinical Pharmacist Assistant

## 2020-05-30 ENCOUNTER — Other Ambulatory Visit: Payer: Self-pay

## 2020-05-30 ENCOUNTER — Ambulatory Visit (INDEPENDENT_AMBULATORY_CARE_PROVIDER_SITE_OTHER): Payer: Medicare HMO

## 2020-05-30 VITALS — BP 138/82 | HR 72 | Temp 98.2°F | Resp 16 | Ht 62.0 in | Wt 210.2 lb

## 2020-05-30 DIAGNOSIS — Z1231 Encounter for screening mammogram for malignant neoplasm of breast: Secondary | ICD-10-CM

## 2020-05-30 DIAGNOSIS — Z0000 Encounter for general adult medical examination without abnormal findings: Secondary | ICD-10-CM

## 2020-05-30 DIAGNOSIS — Z1211 Encounter for screening for malignant neoplasm of colon: Secondary | ICD-10-CM

## 2020-05-30 NOTE — Patient Instructions (Signed)
Colonoscopy, Adult A colonoscopy is a procedure to look at the entire large intestine. This procedure is done using a long, thin, flexible tube that has a camera on the end. You may have a colonoscopy:  As a part of normal colorectal screening.  If you have certain symptoms, such as: ? A low number of red blood cells in your blood (anemia). ? Diarrhea that does not go away. ? Pain in your abdomen. ? Blood in your stool. A colonoscopy can help screen for and diagnose medical problems, including:  Tumors.  Extra tissue that grows where mucus forms (polyps).  Inflammation.  Areas of bleeding. Tell your health care provider about:  Any allergies you have.  All medicines you are taking, including vitamins, herbs, eye drops, creams, and over-the-counter medicines.  Any problems you or family members have had with anesthetic medicines.  Any blood disorders you have.  Any surgeries you have had.  Any medical conditions you have.  Any problems you have had with having bowel movements.  Whether you are pregnant or may be pregnant. What are the risks? Generally, this is a safe procedure. However, problems may occur, including:  Bleeding.  Damage to your intestine.  Allergic reactions to medicines given during the procedure.  Infection. This is rare. What happens before the procedure? Eating and drinking restrictions Follow instructions from your health care provider about eating or drinking restrictions, which may include:  A few days before the procedure: ? Follow a low-fiber diet. ? Avoid nuts, seeds, dried fruit, raw fruits, and vegetables.  1-3 days before the procedure: ? Eat only gelatin dessert or ice pops. ? Drink only clear liquids, such as water, clear juice, clear broth or bouillon, black coffee or tea, or clear soft drinks or sports drinks. ? Avoid liquids that contain red or purple dye.  The day of the procedure: ? Do not eat solid foods. You may  continue to drink clear liquids until up to 2 hours before the procedure. ? Do not eat or drink anything starting 2 hours before the procedure, or within the time period that your health care provider recommends. Bowel prep If you were prescribed a bowel prep to take by mouth (orally) to clean out your colon:  Take it as told by your health care provider. Starting the day before your procedure, you will need to drink a large amount of liquid medicine. The liquid will cause you to have many bowel movements of loose stool until your stool becomes almost clear or light green.  If your skin or the opening between the buttocks (anus) gets irritated from diarrhea, you may relieve the irritation using: ? Wipes with medicine in them, such as adult wet wipes with aloe and vitamin E. ? A product to soothe skin, such as petroleum jelly.  If you vomit while drinking the bowel prep: ? Take a break for up to 60 minutes. ? Begin the bowel prep again. ? Call your health care provider if you keep vomiting or you cannot take the bowel prep without vomiting.  To clean out your colon, you may also be given: ? Laxative medicines. These help you have a bowel movement. ? Instructions for enema use. An enema is liquid medicine injected into your rectum. Medicines Ask your health care provider about:  Changing or stopping your regular medicines or supplements. This is especially important if you are taking iron supplements, diabetes medicines, or blood thinners.  Taking medicines such as aspirin and ibuprofen. These  medicines can thin your blood. Do not take these medicines unless your health care provider tells you to take them.  Taking over-the-counter medicines, vitamins, herbs, and supplements. General instructions  Ask your health care provider what steps will be taken to help prevent infection. These may include washing skin with a germ-killing soap.  Plan to have someone take you home from the hospital  or clinic. What happens during the procedure?  An IV will be inserted into one of your veins.  You may be given one or more of the following: ? A medicine to help you relax (sedative). ? A medicine to numb the area (local anesthetic). ? A medicine to make you fall asleep (general anesthetic). This is rarely needed.  You will lie on your side with your knees bent.  The tube will: ? Have oil or gel put on it (be lubricated). ? Be inserted into your anus. ? Be gently eased through all parts of your large intestine.  Air will be sent into your colon to keep it open. This may cause some pressure or cramping.  Images will be taken with the camera and will appear on a screen.  A small tissue sample may be removed to be looked at under a microscope (biopsy). The tissue may be sent to a lab for testing if any signs of problems are found.  If small polyps are found, they may be removed and checked for cancer cells.  When the procedure is finished, the tube will be removed. The procedure may vary among health care providers and hospitals.   What happens after the procedure?  Your blood pressure, heart rate, breathing rate, and blood oxygen level will be monitored until you leave the hospital or clinic.  You may have a small amount of blood in your stool.  You may pass gas and have mild cramping or bloating in your abdomen. This is caused by the air that was used to open your colon during the exam.  Do not drive for 24 hours after the procedure.  It is up to you to get the results of your procedure. Ask your health care provider, or the department that is doing the procedure, when your results will be ready. Summary  A colonoscopy is a procedure to look at the entire large intestine.  Follow instructions from your health care provider about eating and drinking before the procedure.  If you were prescribed an oral bowel prep to clean out your colon, take it as told by your health care  provider.  During the colonoscopy, a flexible tube with a camera on its end is inserted into the anus and then passed into the other parts of the large intestine. This information is not intended to replace advice given to you by your health care provider. Make sure you discuss any questions you have with your health care provider. Document Revised: 07/31/2018 Document Reviewed: 07/31/2018 Elsevier Patient Education  2021 [place] Prevention in the Home, Adult Falls can cause injuries and can happen to people of all ages. There are many things you can do to make your home safe and to help prevent falls. Ask for help when making these changes. What actions can I take to prevent falls? General Instructions  Use good lighting in all rooms. Replace any light bulbs that burn out.  Turn on the lights in dark areas. Use night-lights.  Keep items that you use often in easy-to-reach places. Lower the shelves around your  home if needed.  Set up your furniture so you have a clear path. Avoid moving your furniture around.  Do not have throw rugs or other things on the floor that can make you trip.  Avoid walking on wet floors.  If any of your floors are uneven, fix them.  Add color or contrast paint or tape to clearly mark and help you see: ? Grab bars or handrails. ? First and last steps of staircases. ? Where the edge of each step is.  If you use a stepladder: ? Make sure that it is fully opened. Do not climb a closed stepladder. ? Make sure the sides of the stepladder are locked in place. ? Ask someone to hold the stepladder while you use it.  Know where your pets are when moving through your home. What can I do in the bathroom?  Keep the floor dry. Clean up any water on the floor right away.  Remove soap buildup in the tub or shower.  Use nonskid mats or decals on the floor of the tub or shower.  Attach bath mats securely with double-sided, nonslip rug tape.  If  you need to sit down in the shower, use a plastic, nonslip stool.  Install grab bars by the toilet and in the tub and shower. Do not use towel bars as grab bars.      What can I do in the bedroom?  Make sure that you have a light by your bed that is easy to reach.  Do not use any sheets or blankets for your bed that hang to the floor.  Have a firm chair with side arms that you can use for support when you get dressed. What can I do in the kitchen?  Clean up any spills right away.  If you need to reach something above you, use a step stool with a grab bar.  Keep electrical cords out of the way.  Do not use floor polish or wax that makes floors slippery. What can I do with my stairs?  Do not leave any items on the stairs.  Make sure that you have a light switch at the top and the bottom of the stairs.  Make sure that there are handrails on both sides of the stairs. Fix handrails that are broken or loose.  Install nonslip stair treads on all your stairs.  Avoid having throw rugs at the top or bottom of the stairs.  Choose a carpet that does not hide the edge of the steps on the stairs.  Check carpeting to make sure that it is firmly attached to the stairs. Fix carpet that is loose or worn. What can I do on the outside of my home?  Use bright outdoor lighting.  Fix the edges of walkways and driveways and fix any cracks.  Remove anything that might make you trip as you walk through a door, such as a raised step or threshold.  Trim any bushes or trees on paths to your home.  Check to see if handrails are loose or broken and that both sides of all steps have handrails.  Install guardrails along the edges of any raised decks and porches.  Clear paths of anything that can make you trip, such as tools or rocks.  Have leaves, snow, or ice cleared regularly.  Use sand or salt on paths during winter.  Clean up any spills in your garage right away. This includes grease or  oil spills. What other actions  can I take?  Wear shoes that: ? Have a low heel. Do not wear high heels. ? Have rubber bottoms. ? Feel good on your feet and fit well. ? Are closed at the toe. Do not wear open-toe sandals.  Use tools that help you move around if needed. These include: ? Canes. ? Walkers. ? Scooters. ? Crutches.  Review your medicines with your doctor. Some medicines can make you feel dizzy. This can increase your chance of falling. Ask your doctor what else you can do to help prevent falls. Where to find more information  Centers for Disease Control and Prevention, [employer]: http://www.wolf.info/  National Institute on Aging: http://kim-miller.com/ Contact a doctor if:  You are afraid of falling at home.  You feel weak, drowsy, or dizzy at home.  You fall at home. Summary  There are many simple things that you can do to make your home safe and to help prevent falls.  Ways to make your home safe include removing things that can make you trip and installing grab bars in the bathroom.  Ask for help when making these changes in your home. This information is not intended to replace advice given to you by your health care provider. Make sure you discuss any questions you have with your health care provider. Document Revised: 08/11/2019 Document Reviewed: 08/11/2019 Elsevier Patient Education  [place] Maintenance, Female Adopting a healthy lifestyle and getting preventive care are important in promoting health and wellness. Ask your health care provider about:  The right schedule for you to have regular tests and exams.  Things you can do on your own to prevent diseases and keep yourself healthy. What should I know about diet, weight, and exercise? Eat a healthy diet  Eat a diet that includes plenty of vegetables, fruits, low-fat dairy products, and lean protein.  Do not eat a lot of foods that are high in solid fats, added sugars, or sodium.   Maintain  a healthy weight Body mass index (BMI) is used to identify weight problems. It estimates body fat based on height and weight. Your health care provider can help determine your BMI and help you achieve or maintain a healthy weight. Get regular exercise Get regular exercise. This is one of the most important things you can do for your health. Most adults should:  Exercise for at least 150 minutes each week. The exercise should increase your heart rate and make you sweat (moderate-intensity exercise).  Do strengthening exercises at least twice a week. This is in addition to the moderate-intensity exercise.  Spend less time sitting. Even light physical activity can be beneficial. Watch cholesterol and blood lipids Have your blood tested for lipids and cholesterol at 63 years of age, then have this test every 5 years. Have your cholesterol levels checked more often if:  Your lipid or cholesterol levels are high.  You are older than 63 years of age.  You are at high risk for heart disease. What should I know about cancer screening? Depending on your health history and family history, you may need to have cancer screening at various ages. This may include screening for:  Breast cancer.  Cervical cancer.  Colorectal cancer.  Skin cancer.  Lung cancer. What should I know about heart disease, diabetes, and high blood pressure? Blood pressure and heart disease  High blood pressure causes heart disease and increases the risk of stroke. This is more likely to develop in people who have high blood  pressure readings, are of African descent, or are overweight.  Have your blood pressure checked: ? Every 3-5 years if you are 30-31 years of age. ? Every year if you are 63 years old or older. Diabetes Have regular diabetes screenings. This checks your fasting blood sugar level. Have the screening done:  Once every three years after age 3 if you are at a normal weight and have a low risk for  diabetes.  More often and at a younger age if you are overweight or have a high risk for diabetes. What should I know about preventing infection? Hepatitis B If you have a higher risk for hepatitis B, you should be screened for this virus. Talk with your health care provider to find out if you are at risk for hepatitis B infection. Hepatitis C Testing is recommended for:  Everyone born from [birth year] through 1963.  Anyone with known risk factors for hepatitis C. Sexually transmitted infections (STIs)  Get screened for STIs, including gonorrhea and chlamydia, if: ? You are sexually active and are younger than 63 years of age. ? You are older than 63 years of age and your health care provider tells you that you are at risk for this type of infection. ? Your sexual activity has changed since you were last screened, and you are at increased risk for chlamydia or gonorrhea. Ask your health care provider if you are at risk.  Ask your health care provider about whether you are at high risk for HIV. Your health care provider may recommend a prescription medicine to help prevent HIV infection. If you choose to take medicine to prevent HIV, you should first get tested for HIV. You should then be tested every 3 months for as long as you are taking the medicine. Pregnancy  If you are about to stop having your period (premenopausal) and you may become pregnant, seek counseling before you get pregnant.  Take 400 to 800 micrograms (mcg) of folic acid every day if you become pregnant.  Ask for birth control (contraception) if you want to prevent pregnancy. Osteoporosis and menopause Osteoporosis is a disease in which the bones lose minerals and strength with aging. This can result in bone fractures. If you are 60 years old or older, or if you are at risk for osteoporosis and fractures, ask your health care provider if you should:  Be screened for bone loss.  Take a calcium or vitamin D supplement to lower  your risk of fractures.  Be given hormone replacement therapy (HRT) to treat symptoms of menopause. Follow these instructions at home: Lifestyle  Do not use any products that contain nicotine or tobacco, such as cigarettes, e-cigarettes, and chewing tobacco. If you need help quitting, ask your health care provider.  Do not use street drugs.  Do not share needles.  Ask your health care provider for help if you need support or information about quitting drugs. Alcohol use  Do not drink alcohol if: ? Your health care provider tells you not to drink. ? You are pregnant, may be pregnant, or are planning to become pregnant.  If you drink alcohol: ? Limit how much you use to 0-1 drink a day. ? Limit intake if you are breastfeeding.  Be aware of how much alcohol is in your drink. In the [place] one drink equals one 12 oz bottle of beer (355 mL), one 5 oz glass of wine (148 mL), or one 1 oz glass of hard liquor (44 mL). General instructions  Schedule regular health, dental, and eye exams.  Stay current with your vaccines.  Tell your health care provider if: ? You often feel depressed. ? You have ever been abused or do not feel safe at home. Summary  Adopting a healthy lifestyle and getting preventive care are important in promoting health and wellness.  Follow your health care provider's instructions about healthy diet, exercising, and getting tested or screened for diseases.  Follow your health care provider's instructions on monitoring your cholesterol and blood pressure. This information is not intended to replace advice given to you by your health care provider. Make sure you discuss any questions you have with your health care provider. Document Revised: 12/31/2017 Document Reviewed: 12/31/2017 Elsevier Patient Education  2021 [employer].

## 2020-05-30 NOTE — Progress Notes (Signed)
Subjective:   Andrea White is a 65 y.o. female who presents for Medicare Annual (Subsequent) preventive examination.  This wellness visit is conducted by a nurse.  The patient's medications were reviewed and reconciled since the patient's last visit.  History details were provided by the patient.  The history appears to be reliable.    Patient's last AWV was one year ago.   Medical History: Patient history and Family history was reviewed  Medications, Allergies, and preventative health maintenance was reviewed and updated.   Review of Systems    Review of Systems  Constitutional: Negative.   HENT: Negative.   Eyes: Negative.   Respiratory: Positive for cough. Negative for chest tightness and shortness of breath.   Cardiovascular: Negative.  Negative for chest pain and palpitations.  Gastrointestinal: Negative.   Genitourinary: Negative.   Musculoskeletal: Positive for arthralgias.  Neurological: Negative for dizziness and headaches.  Psychiatric/Behavioral: Negative.  Negative for confusion, dysphoric mood and suicidal ideas.   Cardiac Risk Factors include: diabetes mellitus;dyslipidemia;hypertension;obesity (BMI >30kg/m2)     Objective:    Today's Vitals   05/30/20 0904  BP: 138/82  Pulse: 72  Resp: 16  Temp: 98.2 F (36.8 C)  SpO2: 94%  Weight: 210 lb 3.2 oz (95.3 kg)  Height: $Remove'5\' 2"'uQRjCOG$  (1.575 m)  PainSc: 2    Body mass index is 38.45 kg/m.  Advanced Directives 05/30/2020 05/26/2019 09/25/2017 06/06/2016  Does Patient Have a Medical Advance Directive? Yes Yes No No  Type of Paramedic of [place];Living will [place];Living will - -  Does patient want to make changes to medical advance directive? No - Patient declined No - Patient declined - -  Copy of [place] in Chart? No - copy requested No - copy requested - -  Would patient like information on creating a medical advance directive? - - No - Patient  declined No - Patient declined    Current Medications (verified) Outpatient Encounter Medications as of 05/30/2020  Medication Sig  . ALPRAZolam (XANAX) 0.5 MG tablet TAKE 1/2 TO 1 TABLET BY MOUTH EVERY 8 HOURS AS NEEDED.  Marland Kitchen APPLE CIDER VINEGAR PO Take by mouth daily.  . Biotin 10000 MCG TABS Take 1 tablet by mouth daily.  . Blood Glucose Monitoring Suppl (TRUE METRIX METER) w/Device KIT USE AS DIRECTED  . diclofenac (VOLTAREN) 75 MG EC tablet Take 75 mg by mouth 2 (two) times daily.  . diclofenac Sodium (VOLTAREN) 1 % GEL Apply topically 4 (four) times daily as needed.  . DULoxetine (CYMBALTA) 60 MG capsule Take 1 capsule (60 mg total) by mouth daily.  Marland Kitchen glucose blood (TRUE METRIX BLOOD GLUCOSE TEST) test strip USE AS DIRECTED TO CHECK FASTING BLOOD SUGAR  . loratadine (CLARITIN) 10 MG tablet Take 1 tablet (10 mg total) by mouth daily.  . meclizine (ANTIVERT) 25 MG tablet TAKE 1 TABLET TWICE DAILY AS NEEDED FOR DIZZINESS.  . metFORMIN (GLUCOPHAGE) 500 MG tablet TAKE 1 TABLET TWICE DAILY WITH MEALS  . Multiple Vitamin (MULTIVITAMIN) tablet Take 1 tablet by mouth daily.  Marland Kitchen omeprazole (PRILOSEC) 40 MG capsule TAKE 1 CAPSULE (40 MG TOTAL) BY MOUTH 2 (TWO) TIMES DAILY.  Marland Kitchen oxybutynin (DITROPAN) 5 MG tablet Take 1 tablet (5 mg total) by mouth 3 (three) times daily. (Patient taking differently: Take 5 mg by mouth 2 (two) times daily.)  . pravastatin (PRAVACHOL) 20 MG tablet Take 20 mg by mouth daily.  Marland Kitchen PROVENTIL HFA 108 (90 Base)  MCG/ACT inhaler INHALE 2 PUFFS BY MOUTH FOUR TIMES A DAY AS NEEDED FOR WHEEZING  . traMADol (ULTRAM) 50 MG tablet Take 1 tablet (50 mg total) by mouth 3 (three) times daily as needed.  . TRUEplus Lancets 33G MISC E11.69 use new lancet each time when checking FBS  . spironolactone-hydrochlorothiazide (ALDACTAZIDE) 25-25 MG tablet TAKE 1 TABLET EVERY DAY (Patient not taking: Reported on 05/30/2020)  . [DISCONTINUED] Blood Glucose Monitoring Suppl (TRUE METRIX AIR GLUCOSE  METER) w/Device KIT E11.69 Use meter to check FBS as directed  . [DISCONTINUED] furosemide (LASIX) 20 MG tablet TAKE 1 TABLET EVERY DAY AS NEEDED FOR EDEMA.  . [DISCONTINUED] meclizine (ANTIVERT) 25 MG tablet Take 1 tablet (25 mg total) by mouth 2 (two) times daily as needed for dizziness.  . [DISCONTINUED] metFORMIN (GLUCOPHAGE) 500 MG tablet TAKE 1 TABLET TWICE DAILY WITH MEALS  . [DISCONTINUED] omeprazole (PRILOSEC) 40 MG capsule Take 1 capsule (40 mg total) by mouth 2 (two) times daily.   No facility-administered encounter medications on file as of 05/30/2020.    Allergies (verified) Morphine and related, Celebrex [celecoxib], Gabapentin, Lisinopril, Guaifenesin & derivatives, Lipitor [atorvastatin], Sulfa antibiotics, and Valium [diazepam]   History: Past Medical History:  Diagnosis Date  . Allergy   . Anxiety   . Arthritis   . Bradycardia, unspecified   . Bruises easily   . Calf pain    when walking  . Fibromyalgia   . Frequent headaches   . GERD (gastroesophageal reflux disease)   . H/O bladder problems   . Hypertension   . IBS (irritable bowel syndrome)   . Localized edema   . Mild persistent asthma, uncomplicated   . Morbid (severe) obesity due to excess calories ([place])   . Muscle pain   . Myocardial infarction ([place])   . Other hypotension   . Ovarian cyst   . Slow transit constipation   . Solitary pulmonary nodule   . Swelling    feet and legs  . Type 2 diabetes mellitus with diabetic nephropathy ([place])   . Varicose veins of bilateral lower extremities with pain    Past Surgical History:  Procedure Laterality Date  . BUNIONECTOMY     left foot 5th toe  . CARDIAC CATHETERIZATION    . carpel tunnel surgery    . CESAREAN SECTION    . CHOLECYSTECTOMY    . GALLBLADDER SURGERY    . TONSILLECTOMY    . TUBAL LIGATION    . VAGINAL HYSTERECTOMY  2003   LAVH/SR   Family History  Problem Relation Age of Onset  . Heart disease Father   . Hypertension Father   .  Stroke Father   . Heart attack Father   . Cerebrovascular Accident Father   . Diabetes Sister   . Kidney disease Sister   . Migraines Daughter   . Cancer Maternal Aunt 74       breast   Social History   Socioeconomic History  . Marital status: Married  . Number of children: 1  Occupational History  . Occupation: Disabled due to foot problems since 1993  Tobacco Use  . Smoking status: Never Smoker  . Smokeless tobacco: Never Used  Vaping Use  . Vaping Use: Never used  Substance and Sexual Activity  . Alcohol use: No  . Drug use: No  . Sexual activity: Yes    Birth control/protection: Surgical    Comment: hyst   Social Determinants of Health   Financial Resource Strain: Not on file  Food Insecurity: No Food Insecurity  . Worried About Charity fundraiser in the Last Year: Never true  . Ran Out of Food in the Last Year: Never true  Transportation Needs: No Transportation Needs  . Lack of Transportation (Medical): No  . Lack of Transportation (Non-Medical): No  Physical Activity: Insufficiently Active  . Days of Exercise per Week: 4 days  . Minutes of Exercise per Session: 30 min  Stress: Not on file  Social Connections: Not on file   Tobacco Counseling Counseling given: Not Answered  Clinical Intake: Pre-visit preparation completed: Yes Pain : 0-10 Pain Score: 2  Pain Type: Chronic pain Pain Descriptors / Indicators: Aching Effect of Pain on Daily Activities: minimal   BMI - recorded: 38.45 Nutritional Status: BMI > 30  Obese Nutritional Risks: None Diabetes: Yes CBG done?: No Did pt. bring in CBG monitor from home?: No (patient reports home readings in low 100's) How often do you need to have someone help you when you read instructions, pamphlets, or other written materials from your doctor or pharmacy?: 1 - Never Interpreter Needed?: No   Activities of Daily Living In your present state of health, do you have any difficulty performing the following  activities: 05/30/2020 04/18/2020  Hearing? N N  Vision? N N  Difficulty concentrating or making decisions? N N  Walking or climbing stairs? N N  Dressing or bathing? N N  Doing errands, shopping? N N  Preparing Food and eating ? N -  Using the Toilet? N -  In the past six months, have you accidently leaked urine? N -  Do you have problems with loss of bowel control? N -  Managing your Medications? N -  Managing your Finances? N -  Housekeeping or managing your Housekeeping? N -  Some recent data might be hidden    Patient Care Team: [name], MD as PCP - General (Family Medicine) Joya Salm, MD as Referring Physician (Orthopedic Surgery) Landis Martins, DPM as Consulting Physician (Podiatry) Delsa Bern, MD as Consulting Physician (Obstetrics and Gynecology) Lynnell Dike, OD (Optometry) Burnice Logan, [facility] as Pharmacist (Pharmacist) Starling Manns, MD (Orthopedic Surgery)     Assessment:   This is a routine wellness examination for [place].   Dietary issues and exercise activities discussed: Current Exercise Habits: Patient is active daily outside working with animals and in the yard however she does not do specific exercise.  Depression Screen PHQ 2/9 Scores 05/30/2020 04/18/2020 10/29/2019 05/26/2019 04/30/2019 04/30/2019 09/25/2017  PHQ - 2 Score 0 0 0 0 0 0 0  PHQ- 9 Score 0 0 0 - - - -    Fall Risk Fall Risk  05/30/2020 04/18/2020 05/26/2019 04/30/2019  Falls in the past year? 1 1 0 0  Number falls in past yr: 0 0 0 0  Injury with Fall? 1 1 0 0  Risk for fall due to : Impaired balance/gait Impaired balance/gait No Fall Risks -  Risk for fall due to: Comment - Tripped in yard - -  Follow up Falls evaluation completed;Falls prevention discussed Falls evaluation completed Falls prevention discussed;Falls evaluation completed Falls evaluation completed    FALL RISK PREVENTION PERTAINING TO THE HOME:  Any stairs in or around the home? Yes  If so, are there any without  handrails? No  Home free of loose throw rugs in walkways, pet beds, electrical cords, etc? Yes  Adequate lighting in your home to reduce risk of falls? Yes   ASSISTIVE DEVICES  UTILIZED TO PREVENT FALLS:  Life alert? No  Use of a cane, walker or w/c? No  Grab bars in the bathroom? No  Shower chair or bench in shower? No  Elevated toilet seat or a handicapped toilet? No  Gait steady and fast without use of assistive device  Cognitive Function:     6CIT Screen 05/30/2020 05/26/2019  What Year? 0 points 0 points  What month? 0 points 0 points  What time? 0 points 0 points  Count back from 20 0 points 0 points  Months in reverse 0 points 0 points  Repeat phrase 0 points 0 points  Total Score 0 0    Immunizations Immunization History  Administered Date(s) Administered  . Influenza Inj Mdck Quad Pf 10/29/2019  . Influenza-Unspecified 11/03/2017, 09/25/2018  . Moderna Sars-Covid-2 Vaccination 07/27/2019, 08/23/2019  . Pneumococcal Polysaccharide-23 11/06/2015  . Tdap 08/29/2015    TDAP status: Up to date  Flu Vaccine status: Up to date  Pneumococcal vaccine status: Up to date  Covid-19 vaccine status: Declined, Patient has had first two [place].  Patient declined recommended third dose.  She was educated on benefits of receiving the vaccine and risks associated with not staying up to date with it.  Screening Tests Health Maintenance  Topic Date Due  . HIV Screening  Never done  . Hepatitis C Screening  Never done  . PAP SMEAR-Modifier  01/22/2016  . COLONOSCOPY (Pts 45-90yrs Insurance coverage will need to be confirmed)  05/14/2016  . OPHTHALMOLOGY EXAM  09/18/2019  . FOOT EXAM  12/10/2019  . COVID-19 Vaccine (3 - Booster for Moderna series) 02/23/2020  . URINE MICROALBUMIN  04/29/2020  . INFLUENZA VACCINE  08/21/2020  . HEMOGLOBIN A1C  10/28/2020  . MAMMOGRAM  08/03/2021  . TETANUS/TDAP  08/28/2025  . PNEUMOCOCCAL POLYSACCHARIDE VACCINE AGE 69-64 HIGH RISK   Completed  . HPV VACCINES  Aged Out    Health Maintenance  Health Maintenance Due  Topic Date Due  . HIV Screening  Never done  . Hepatitis C Screening  Never done  . PAP SMEAR-Modifier  01/22/2016  . COLONOSCOPY (Pts 45-81yrs Insurance coverage will need to be confirmed)  05/14/2016  . OPHTHALMOLOGY EXAM  09/18/2019  . FOOT EXAM  12/10/2019  . COVID-19 Vaccine (3 - Booster for Moderna series) 02/23/2020  . URINE MICROALBUMIN  04/29/2020    Colorectal cancer screening: Referral to GI placed for follow-up colonoscopy. Pt aware the office will call to set up appointment.  Mammogram status: Schedule for the mobile mammogram July 25.  Bone Density status: Completed 2013.   Lung Cancer Screening: (Low Dose CT Chest recommended if Age 74-80 years, 30 pack-year currently smoking OR have quit w/in 15years.) does not qualify.    Additional Screening:  Vision Screening: Recommended annual ophthalmology exams for early detection of glaucoma and other disorders of the eye. Is the patient up to date with their annual eye exam?  No  Who is the provider or what is the name of the office in which the patient attends annual eye exams? Dr Renaldo Fiddler, [place] - Patient will call to schedule follow-up  Dental Screening: Recommended annual dental exams for proper oral hygiene    Plan:     1- Moderna third dose recommended, patient declined 2- Diabetic Eye Exam - Patient will call to make appointment with Dr Renaldo Fiddler 3- Colorectal Cancer Screening - Referral made to Dr Melina Copa for follow-up colonoscopy.  Last one was done in 2013 and was recommended to  follow-up in 5 years.  Patient is overdue.  She denies any problems. 4- Mammogram - patient will be due in July - appointment scheduled to have mammogram on the Mobile Bus 08/14/20  I have personally reviewed and noted the following in the patient's chart:   . Medical and social history . Use of alcohol, tobacco or illicit drugs  . Current  medications and supplements including opioid prescriptions.  . Functional ability and status . Nutritional status . Physical activity . Advanced directives . List of other physicians . Hospitalizations, surgeries, and ER visits in previous 12 months . Vitals . Screenings to include cognitive, depression, and falls . Referrals and appointments  In addition, I have reviewed and discussed with patient certain preventive protocols, quality metrics, and best practice recommendations. A written personalized care plan for preventive services as well as general preventive health recommendations were provided to patient.    Erie Noe, LPN   09/15/35

## 2020-06-09 ENCOUNTER — Telehealth: Payer: Self-pay | Admitting: Sports Medicine

## 2020-06-09 ENCOUNTER — Other Ambulatory Visit: Payer: Self-pay | Admitting: Family Medicine

## 2020-06-09 NOTE — Telephone Encounter (Signed)
Pt left message checking on status of diabetic shoes  I returned call and left messageafter checking and we are missing 1 page from the documents. I have faxed the document with a note to Dr Tobie Poet to see if we could get it signed and faxed directly to me. Once we receive the documents it usually takes between 2 to 3 wks before we get the shoes in and I will call when they come in.

## 2020-07-04 ENCOUNTER — Ambulatory Visit: Payer: Medicare HMO | Admitting: Sports Medicine

## 2020-07-04 ENCOUNTER — Other Ambulatory Visit: Payer: Self-pay

## 2020-07-04 ENCOUNTER — Encounter: Payer: Self-pay | Admitting: Sports Medicine

## 2020-07-04 DIAGNOSIS — M19071 Primary osteoarthritis, right ankle and foot: Secondary | ICD-10-CM

## 2020-07-04 DIAGNOSIS — M19072 Primary osteoarthritis, left ankle and foot: Secondary | ICD-10-CM

## 2020-07-04 DIAGNOSIS — M79675 Pain in left toe(s): Secondary | ICD-10-CM

## 2020-07-04 DIAGNOSIS — M79674 Pain in right toe(s): Secondary | ICD-10-CM

## 2020-07-04 DIAGNOSIS — E1142 Type 2 diabetes mellitus with diabetic polyneuropathy: Secondary | ICD-10-CM

## 2020-07-04 DIAGNOSIS — B351 Tinea unguium: Secondary | ICD-10-CM

## 2020-07-04 DIAGNOSIS — M2142 Flat foot [pes planus] (acquired), left foot: Secondary | ICD-10-CM

## 2020-07-04 DIAGNOSIS — M2141 Flat foot [pes planus] (acquired), right foot: Secondary | ICD-10-CM

## 2020-07-04 NOTE — Progress Notes (Signed)
Subjective: Andrea White is a 65 y.o. female patient who returns to office for diabetic nail care.  Patient reports that her A1C 6.7 and blood sugar 124 this morning, last visit with PCP, Dr. Tobie Poet was 1 month ago. Reports that she is still waiting on her diabetic shoes.  No other acute issues noted.  Patient Active Problem List   Diagnosis Date Noted   Diabetic polyneuropathy associated with type 2 diabetes mellitus ([place]) 04/30/2019   Essential hypertension 04/30/2019   Mixed hyperlipidemia 04/30/2019   Mixed diabetic hyperlipidemia associated with type 2 diabetes mellitus ([place]) 04/30/2019   Major depressive disorder, single episode, mild ([place]) 04/30/2019   Myocardial infarction ([place]) 10/31/2017   Primary fibromyalgia syndrome 10/31/2017   Gastroesophageal reflux disease 03/15/2015   Asthma 03/15/2015   Obesity 03/15/2015   Herniated disc 06/10/2011   History of heart attack 06/10/2011   Cyst of ovary     Current Outpatient Medications on File Prior to Visit  Medication Sig Dispense Refill   ALPRAZolam (XANAX) 0.5 MG tablet TAKE 1/2 TO 1 TABLET BY MOUTH EVERY 8 HOURS AS NEEDED. 30 tablet 5   APPLE CIDER VINEGAR PO Take by mouth daily.     Biotin 10000 MCG TABS Take 1 tablet by mouth daily.     Blood Glucose Monitoring Suppl (TRUE METRIX METER) w/Device KIT USE AS DIRECTED 1 kit 0   diclofenac (VOLTAREN) 75 MG EC tablet Take 75 mg by mouth 2 (two) times daily.     diclofenac Sodium (VOLTAREN) 1 % GEL Apply topically 4 (four) times daily as needed.     DULoxetine (CYMBALTA) 60 MG capsule Take 1 capsule (60 mg total) by mouth daily. 90 capsule 1   glucose blood (TRUE METRIX BLOOD GLUCOSE TEST) test strip USE AS DIRECTED TO CHECK FASTING BLOOD SUGAR 300 strip 2   loratadine (CLARITIN) 10 MG tablet Take 1 tablet (10 mg total) by mouth daily. 90 tablet 3   meclizine (ANTIVERT) 25 MG tablet TAKE 1 TABLET TWICE DAILY  AS  NEEDED  FOR  DIZZINESS. 180 tablet 1   metFORMIN (GLUCOPHAGE) 500 MG  tablet TAKE 1 TABLET TWICE DAILY WITH MEALS 180 tablet 1   Multiple Vitamin (MULTIVITAMIN) tablet Take 1 tablet by mouth daily.     omeprazole (PRILOSEC) 40 MG capsule TAKE 1 CAPSULE (40 MG TOTAL) BY MOUTH 2 (TWO) TIMES DAILY. 180 capsule 1   oxybutynin (DITROPAN) 5 MG tablet Take 1 tablet (5 mg total) by mouth 3 (three) times daily. (Patient taking differently: Take 5 mg by mouth 2 (two) times daily.) 270 tablet 0   pravastatin (PRAVACHOL) 20 MG tablet Take 20 mg by mouth daily.     PROVENTIL HFA 108 (90 Base) MCG/ACT inhaler INHALE 2 PUFFS BY MOUTH FOUR TIMES A DAY AS NEEDED FOR WHEEZING 1 each 2   spironolactone-hydrochlorothiazide (ALDACTAZIDE) 25-25 MG tablet TAKE 1 TABLET EVERY DAY 90 tablet 1   traMADol (ULTRAM) 50 MG tablet Take 1 tablet (50 mg total) by mouth 3 (three) times daily as needed. 90 tablet 1   TRUEplus Lancets 33G MISC E11.69 use new lancet each time when checking FBS 100 each 2   No current facility-administered medications on file prior to visit.    Allergies  Allergen Reactions   Morphine And Related Other (See Comments)    Woozy, "out of body experience"   Celebrex [Celecoxib] Other (See Comments)    Patient does not remember intolerance   Gabapentin Other (See Comments)  Confusion   Lisinopril Cough   Guaifenesin & Derivatives Other (See Comments)    Patient cannot remember specific issue, just made her feel "weird"   Lipitor [Atorvastatin] Rash   Sulfa Antibiotics Rash   Valium [Diazepam] Other (See Comments)    Lightheaded, dizziness     Objective:  General: Alert and oriented x3 in no acute distress  Dermatology: Nails x10 elongated thickened dystrophic consistent with onychomycosis.    Vascular: Dorsalis Pedis and Posterior Tibial pedal pulses palpable, Capillary Fill Time 5 seconds,(+) pedal hair growth bilateral, no edema bilateral lower extremities, Temperature gradient within normal limits.  Neurology: Johney Maine sensation intact via light touch  bilateral.  Musculoskeletal: + pes planus foot type, bunion and hammertoe deformity that is currently asymptomatic.   Assessment and Plan: Problem List Items Addressed This Visit       Endocrine   Diabetic polyneuropathy associated with type 2 diabetes mellitus ([place])   Other Visit Diagnoses     Pain due to onychomycosis of toenails of both feet    -  Primary   Osteoarthritis of both feet, unspecified osteoarthritis type       Pes planus of both feet           -Complete examination performed -Mechanically debrided nails x10 using a sterile nipper without incident -Continue with spacer as tolerated -Awaiting diabetic shoes; PCP to sign off on paper work -Patient to return to office for in 3 months for nail care or sooner if condition worsens.    Landis Martins, DPM

## 2020-08-09 ENCOUNTER — Other Ambulatory Visit: Payer: Self-pay

## 2020-08-09 ENCOUNTER — Telehealth: Payer: Self-pay

## 2020-08-09 MED ORDER — PRAVASTATIN SODIUM 20 MG PO TABS: 20.0000 mg | ORAL_TABLET | Freq: Every day | ORAL | 0 refills | Status: DC

## 2020-08-09 NOTE — Progress Notes (Signed)
Chronic Care Management Pharmacy Assistant   Name: Andrea White  MRN: [id number] DOB: [birth year]  Reason for Encounter: Disease State for diabetes  Recent office visits:  05/30/20-Kimberly Katrinka Blazing LPN (PCP), annual exam, no medication changes noted, follow up one year  Recent consult visits:  07/04/20-Titorya Marylene Land DPM, Podiatry, toenail pain, no medication changes noted, follow up 3 months   Hospital visits:  None in previous 6 months  Medications: Outpatient Encounter Medications as of 08/09/2020  Medication Sig   ALPRAZolam (XANAX) 0.5 MG tablet TAKE 1/2 TO 1 TABLET BY MOUTH EVERY 8 HOURS AS NEEDED.   APPLE CIDER VINEGAR PO Take by mouth daily.   Biotin 22941 MCG TABS Take 1 tablet by mouth daily.   Blood Glucose Monitoring Suppl (TRUE METRIX METER) w/Device KIT USE AS DIRECTED   diclofenac (VOLTAREN) 75 MG EC tablet Take 75 mg by mouth 2 (two) times daily.   diclofenac Sodium (VOLTAREN) 1 % GEL Apply topically 4 (four) times daily as needed.   DULoxetine (CYMBALTA) 60 MG capsule Take 1 capsule (60 mg total) by mouth daily.   glucose blood (TRUE METRIX BLOOD GLUCOSE TEST) test strip USE AS DIRECTED TO CHECK FASTING BLOOD SUGAR   loratadine (CLARITIN) 10 MG tablet Take 1 tablet (10 mg total) by mouth daily.   meclizine (ANTIVERT) 25 MG tablet TAKE 1 TABLET TWICE DAILY  AS  NEEDED  FOR  DIZZINESS.   metFORMIN (GLUCOPHAGE) 500 MG tablet TAKE 1 TABLET TWICE DAILY WITH MEALS   Multiple Vitamin (MULTIVITAMIN) tablet Take 1 tablet by mouth daily.   omeprazole (PRILOSEC) 40 MG capsule TAKE 1 CAPSULE (40 MG TOTAL) BY MOUTH 2 (TWO) TIMES DAILY.   oxybutynin (DITROPAN) 5 MG tablet Take 1 tablet (5 mg total) by mouth 3 (three) times daily. (Patient taking differently: Take 5 mg by mouth 2 (two) times daily.)   pravastatin (PRAVACHOL) 20 MG tablet Take 20 mg by mouth daily.   PROVENTIL HFA 108 (90 Base) MCG/ACT inhaler INHALE 2 PUFFS BY MOUTH FOUR TIMES A DAY AS NEEDED FOR WHEEZING    spironolactone-hydrochlorothiazide (ALDACTAZIDE) 25-25 MG tablet TAKE 1 TABLET EVERY DAY   traMADol (ULTRAM) 50 MG tablet Take 1 tablet (50 mg total) by mouth 3 (three) times daily as needed.   TRUEplus Lancets 33G MISC E11.69 use new lancet each time when checking FBS   No facility-administered encounter medications on file as of 08/09/2020.   Recent Relevant Labs: Lab Results  Component Value Date/Time   HGBA1C 6.6 (H) 04/28/2020 08:47 AM   HGBA1C 6.5 (H) 10/29/2019 08:50 AM   MICROALBUR 30 04/30/2019 12:32 PM    Kidney Function Lab Results  Component Value Date/Time   CREATININE 0.76 04/28/2020 08:47 AM   CREATININE 0.86 10/29/2019 08:50 AM   GFRNONAA 73 10/29/2019 08:50 AM   GFRAA 84 10/29/2019 08:50 AM     Current antihyperglycemic regimen:  Metformin 500 BID    Patient verbally confirms she is taking the above medications as directed. Yes  What recent interventions/DTPs have been made to improve glycemic control:  Patient stated she was supposed to go back on her Pravastatin due to  her numbers increasing,  I have requested a refill be sent to [facility].     Patient reports hypoglycemic symptoms, including Shaky  Patient reports hyperglycemic symptoms, including feeling very hot  How often are you checking your blood sugar? Patient stated she is not checking her blood sugars, she stated the last time was about 3 weeks ago  What are your blood sugars ranging?  Last tested 3 weeks ago per patient, no current readings.   During the week, how often does your blood glucose drop below 70?  She stated she is not checking her levels   Are you checking your feet daily/regularly? Yes  Adherence Review: Is the patient currently on a STATIN medication? Yes Is the patient currently on ACE/ARB medication? No Does the patient have >5 day gap between last estimated fill dates? CPP to review  Care Gaps: Last eye exam / Retinopathy Screening? 09/18/18 Last Annual Wellness Visit?  05/30/20 Last Diabetic Foot Exam?  07/04/20   Star Rating Drugs:  Medication:  Last Fill: Day Supply Metformin  07/08/20 [place], [place] Pharmacist Assistant [phone]

## 2020-08-10 DIAGNOSIS — Z86010 Personal history of colonic polyps: Secondary | ICD-10-CM | POA: Diagnosis not present

## 2020-08-10 DIAGNOSIS — K219 Gastro-esophageal reflux disease without esophagitis: Secondary | ICD-10-CM | POA: Diagnosis not present

## 2020-08-10 DIAGNOSIS — K5904 Chronic idiopathic constipation: Secondary | ICD-10-CM | POA: Diagnosis not present

## 2020-08-14 ENCOUNTER — Ambulatory Visit
Admission: RE | Admit: 2020-08-14 | Discharge: 2020-08-14 | Disposition: A | Discharge status: Home / Self Care | Payer: Medicare HMO | Source: Ambulatory Visit | Attending: Family Medicine | Admitting: Family Medicine

## 2020-08-14 ENCOUNTER — Other Ambulatory Visit: Payer: Self-pay

## 2020-08-14 DIAGNOSIS — Z1231 Encounter for screening mammogram for malignant neoplasm of breast: Secondary | ICD-10-CM | POA: Diagnosis not present

## 2020-08-17 ENCOUNTER — Other Ambulatory Visit: Payer: Self-pay | Admitting: Family Medicine

## 2020-08-21 ENCOUNTER — Telehealth: Payer: Self-pay | Admitting: Sports Medicine

## 2020-08-21 NOTE — Telephone Encounter (Signed)
Pt left message asking if we got her diabetic shoes in yet she asked for a call back or text message back.  They have came in just need to get pt in for an appt in [place] office and there is no schedule open at this time.   I tried to called pt and she has no voicemail set up.Andrea White

## 2020-08-22 ENCOUNTER — Ambulatory Visit: Payer: Medicare HMO

## 2020-08-23 ENCOUNTER — Ambulatory Visit: Payer: Medicare HMO

## 2020-08-23 ENCOUNTER — Other Ambulatory Visit: Payer: Self-pay

## 2020-08-31 ENCOUNTER — Other Ambulatory Visit: Payer: Self-pay | Admitting: Family Medicine

## 2020-08-31 ENCOUNTER — Telehealth: Payer: Self-pay | Admitting: Family Medicine

## 2020-08-31 DIAGNOSIS — G894 Chronic pain syndrome: Secondary | ICD-10-CM | POA: Diagnosis not present

## 2020-08-31 DIAGNOSIS — M791 Myalgia, unspecified site: Secondary | ICD-10-CM | POA: Diagnosis not present

## 2020-08-31 DIAGNOSIS — H43823 Vitreomacular adhesion, bilateral: Secondary | ICD-10-CM | POA: Diagnosis not present

## 2020-08-31 DIAGNOSIS — Z6836 Body mass index (BMI) 36.0-36.9, adult: Secondary | ICD-10-CM | POA: Diagnosis not present

## 2020-08-31 DIAGNOSIS — E119 Type 2 diabetes mellitus without complications: Secondary | ICD-10-CM | POA: Diagnosis not present

## 2020-08-31 DIAGNOSIS — M5136 Other intervertebral disc degeneration, lumbar region: Secondary | ICD-10-CM | POA: Diagnosis not present

## 2020-08-31 DIAGNOSIS — Z79899 Other long term (current) drug therapy: Secondary | ICD-10-CM | POA: Diagnosis not present

## 2020-08-31 DIAGNOSIS — I10 Essential (primary) hypertension: Secondary | ICD-10-CM | POA: Diagnosis not present

## 2020-08-31 DIAGNOSIS — H524 Presbyopia: Secondary | ICD-10-CM | POA: Diagnosis not present

## 2020-08-31 DIAGNOSIS — E1110 Type 2 diabetes mellitus with ketoacidosis without coma: Secondary | ICD-10-CM | POA: Diagnosis not present

## 2020-08-31 DIAGNOSIS — H35033 Hypertensive retinopathy, bilateral: Secondary | ICD-10-CM | POA: Diagnosis not present

## 2020-08-31 DIAGNOSIS — Z79891 Long term (current) use of opiate analgesic: Secondary | ICD-10-CM | POA: Diagnosis not present

## 2020-08-31 LAB — HM DIABETES EYE EXAM

## 2020-08-31 NOTE — Telephone Encounter (Signed)
Bp was 180/102. Pt took some diltiazem 90 mg one twice a day. BP has improved to 140s/90 She is on spironolactone/hctz once daily.  She has been worried about her husband, who has UTI and passed out last week. He is set up for a follow up next week.  Pt scheduled on Tuesday at 1:45 pm after his appt at 1:30 pm.

## 2020-09-02 DIAGNOSIS — M2041 Other hammer toe(s) (acquired), right foot: Secondary | ICD-10-CM | POA: Diagnosis not present

## 2020-09-02 DIAGNOSIS — M2042 Other hammer toe(s) (acquired), left foot: Secondary | ICD-10-CM | POA: Diagnosis not present

## 2020-09-02 DIAGNOSIS — M2141 Flat foot [pes planus] (acquired), right foot: Secondary | ICD-10-CM | POA: Diagnosis not present

## 2020-09-02 DIAGNOSIS — M2142 Flat foot [pes planus] (acquired), left foot: Secondary | ICD-10-CM | POA: Diagnosis not present

## 2020-09-02 DIAGNOSIS — E1142 Type 2 diabetes mellitus with diabetic polyneuropathy: Secondary | ICD-10-CM | POA: Diagnosis not present

## 2020-09-05 ENCOUNTER — Other Ambulatory Visit: Payer: Self-pay

## 2020-09-05 ENCOUNTER — Encounter: Payer: Self-pay | Admitting: Family Medicine

## 2020-09-05 ENCOUNTER — Ambulatory Visit (INDEPENDENT_AMBULATORY_CARE_PROVIDER_SITE_OTHER): Payer: Medicare HMO | Admitting: Family Medicine

## 2020-09-05 VITALS — BP 162/92 | HR 78 | Temp 97.2°F | Ht 61.0 in | Wt 193.0 lb

## 2020-09-05 DIAGNOSIS — E1142 Type 2 diabetes mellitus with diabetic polyneuropathy: Secondary | ICD-10-CM

## 2020-09-05 DIAGNOSIS — I252 Old myocardial infarction: Secondary | ICD-10-CM | POA: Diagnosis not present

## 2020-09-05 DIAGNOSIS — I10 Essential (primary) hypertension: Secondary | ICD-10-CM

## 2020-09-05 DIAGNOSIS — E782 Mixed hyperlipidemia: Secondary | ICD-10-CM

## 2020-09-05 DIAGNOSIS — E1169 Type 2 diabetes mellitus with other specified complication: Secondary | ICD-10-CM | POA: Diagnosis not present

## 2020-09-05 LAB — POCT UA - MICROALBUMIN: Microalbumin Ur, POC: 80 mg/L

## 2020-09-05 MED ORDER — VALSARTAN 80 MG PO TABS: 80.0000 mg | ORAL_TABLET | Freq: Every day | ORAL | 3 refills | Status: DC

## 2020-09-05 NOTE — Patient Instructions (Addendum)
Increase diltiazem 90 mg 2 pills twice a day until valsartan comes in then decrease diltiazem 90 mg one pill twice daily. Start on valsartan 80 mg once daily.

## 2020-09-05 NOTE — Progress Notes (Signed)
Acute Office Visit  Subjective:    Patient ID: Andrea White, female    DOB: 06/15/1955, 65 y.o.   MRN: [id number]  Chief Complaint  Patient presents with   Hypertension    HPI Patient is in today for hypertension states she restarted taking diltiazem 90 mg 1.5 tablets twice daily since 08/25/2020. Initially started retaking medication due to feeling hot and flushed. C/o some light headedness. At home readings have ranged from 149-195/90-110 since last Thursday. On spironolactone - hctz 25-25 mg once daily. Last night she took diltiazem 90 mg 2 and felt little woozy overnight.  DM: sugar was 143 postprandial. Last a1c 6.6.  Eating healthy. Not exercising due to pain.    Past Medical History:  Diagnosis Date   Allergy    Anxiety    Arthritis    Bradycardia, unspecified    Bruises easily    Calf pain    when walking   Fibromyalgia    Frequent headaches    GERD (gastroesophageal reflux disease)    H/O bladder problems    Hypertension    IBS (irritable bowel syndrome)    Localized edema    Mild persistent asthma, uncomplicated    Morbid (severe) obesity due to excess calories (HCC)    Muscle pain    Myocardial infarction (HCC)    Other hypotension    Ovarian cyst    Slow transit constipation    Solitary pulmonary nodule    Swelling    feet and legs   Type 2 diabetes mellitus with diabetic nephropathy (HCC)    Varicose veins of bilateral lower extremities with pain     Past Surgical History:  Procedure Laterality Date   BUNIONECTOMY     left foot 5th toe   CARDIAC CATHETERIZATION     carpel tunnel surgery     CESAREAN SECTION     CHOLECYSTECTOMY     GALLBLADDER SURGERY     TONSILLECTOMY     TUBAL LIGATION     VAGINAL HYSTERECTOMY  2003   LAVH/SR    Family History  Problem Relation Age of Onset   Heart disease Father    Hypertension Father    Stroke Father    Heart attack Father    Cerebrovascular Accident Father    Diabetes Sister    Kidney disease  Sister    Migraines Daughter    Breast cancer Maternal Aunt    Cancer Maternal Aunt 74       breast    Social History   Socioeconomic History   Marital status: Married    Spouse name: Not on file   Number of children: 1   Years of education: Not on file   Highest education level: Not on file  Occupational History   Occupation: Disabled due to foot problems since 1993  Tobacco Use   Smoking status: Never   Smokeless tobacco: Never  Vaping Use   Vaping Use: Never used  Substance and Sexual Activity   Alcohol use: No   Drug use: No   Sexual activity: Yes    Birth control/protection: Surgical    Comment: hyst  Other Topics Concern   Not on file  Social History Narrative   Not on file   Social Determinants of Health   Financial Resource Strain: Not on file  Food Insecurity: No Food Insecurity   Worried About Running Out of Food in the Last Year: Never true   [place] in the Last Year:  Never true  Transportation Needs: No Transportation Needs   Lack of Transportation (Medical): No   Lack of Transportation (Non-Medical): No  Physical Activity: Insufficiently Active   Days of Exercise per Week: 4 days   Minutes of Exercise per Session: 30 min  Stress: Not on file  Social Connections: Not on file  Intimate Partner Violence: Not on file    Outpatient Medications Prior to Visit  Medication Sig Dispense Refill   diltiazem (CARDIZEM) 90 MG tablet Take 90 mg by mouth 2 (two) times daily. Taking 1.5 tablets     ALPRAZolam (XANAX) 0.5 MG tablet TAKE 1/2 TO 1 TABLET BY MOUTH EVERY 8 HOURS AS NEEDED. 30 tablet 5   APPLE CIDER VINEGAR PO Take by mouth daily.     Biotin 10000 MCG TABS Take 1 tablet by mouth daily.     Blood Glucose Monitoring Suppl (TRUE METRIX METER) w/Device KIT USE AS DIRECTED 1 kit 0   diclofenac (VOLTAREN) 75 MG EC tablet Take 75 mg by mouth 2 (two) times daily.     diclofenac Sodium (VOLTAREN) 1 % GEL Apply topically 4 (four) times daily as needed.      DULoxetine (CYMBALTA) 60 MG capsule TAKE 1 CAPSULE EVERY DAY 90 capsule 1   glucose blood (TRUE METRIX BLOOD GLUCOSE TEST) test strip USE AS DIRECTED TO CHECK FASTING BLOOD SUGAR 300 strip 2   loratadine (CLARITIN) 10 MG tablet Take 1 tablet (10 mg total) by mouth daily. 90 tablet 3   meclizine (ANTIVERT) 25 MG tablet TAKE 1 TABLET TWICE DAILY  AS  NEEDED  FOR  DIZZINESS. 180 tablet 1   metFORMIN (GLUCOPHAGE) 500 MG tablet TAKE 1 TABLET TWICE DAILY WITH MEALS 180 tablet 1   Multiple Vitamin (MULTIVITAMIN) tablet Take 1 tablet by mouth daily.     omeprazole (PRILOSEC) 40 MG capsule TAKE 1 CAPSULE (40 MG TOTAL) BY MOUTH 2 (TWO) TIMES DAILY. 180 capsule 1   oxybutynin (DITROPAN) 5 MG tablet Take 1 tablet (5 mg total) by mouth 3 (three) times daily. (Patient taking differently: Take 5 mg by mouth 2 (two) times daily.) 270 tablet 0   pravastatin (PRAVACHOL) 20 MG tablet Take 1 tablet (20 mg total) by mouth daily. 90 tablet 0   PROVENTIL HFA 108 (90 Base) MCG/ACT inhaler INHALE 2 PUFFS BY MOUTH FOUR TIMES A DAY AS NEEDED FOR WHEEZING 1 each 2   spironolactone-hydrochlorothiazide (ALDACTAZIDE) 25-25 MG tablet TAKE 1 TABLET EVERY DAY 90 tablet 1   traMADol (ULTRAM) 50 MG tablet Take 1 tablet (50 mg total) by mouth 3 (three) times daily as needed. 90 tablet 1   TRUEplus Lancets 33G MISC E11.69 use new lancet each time when checking FBS 100 each 2   No facility-administered medications prior to visit.    Allergies  Allergen Reactions   Morphine And Related Other (See Comments)    Woozy, "out of body experience"   Celebrex [Celecoxib] Other (See Comments)    Patient does not remember intolerance   Gabapentin Other (See Comments)    Confusion   Lisinopril Cough   Guaifenesin & Derivatives Other (See Comments)    Patient cannot remember specific issue, just made her feel "weird"   Lipitor [Atorvastatin] Rash   Sulfa Antibiotics Rash   Valium [Diazepam] Other (See Comments)    Lightheaded,  dizziness     Review of Systems  Constitutional:  Negative for chills, fatigue and fever.  HENT:  Negative for congestion, ear pain, rhinorrhea and sore throat.  Respiratory:  Negative for cough and shortness of breath.   Cardiovascular:  Negative for chest pain.  Gastrointestinal:  Negative for abdominal pain, constipation, diarrhea, nausea and vomiting.  Genitourinary:  Negative for dysuria and urgency.  Musculoskeletal:  Positive for arthralgias and back pain. Negative for myalgias.  Neurological:  Positive for dizziness. Negative for weakness, light-headedness and headaches.  Psychiatric/Behavioral:  Negative for dysphoric mood. The patient is not nervous/anxious.       Objective:    Physical Exam Vitals reviewed.  Constitutional:      Appearance: Normal appearance. She is normal weight.  HENT:     Right Ear: Tympanic membrane, ear canal and external ear normal.     Left Ear: Tympanic membrane, ear canal and external ear normal.     Nose: Nose normal.     Mouth/Throat:     Pharynx: Oropharynx is clear.  Neck:     Vascular: No carotid bruit.  Cardiovascular:     Rate and Rhythm: Normal rate and regular rhythm.     Pulses: Normal pulses.     Heart sounds: Normal heart sounds. No murmur heard. Pulmonary:     Effort: Pulmonary effort is normal. No respiratory distress.     Breath sounds: Normal breath sounds.  Abdominal:     General: Abdomen is flat. Bowel sounds are normal.     Palpations: Abdomen is soft.     Tenderness: There is no abdominal tenderness.  Neurological:     Mental Status: She is alert and oriented to person, place, and time.  Psychiatric:        Mood and Affect: Mood normal.        Behavior: Behavior normal.    BP (!) 162/92   Pulse 78   Temp (!) 97.2 F (36.2 C)   Ht _0  (1.549 m)   Wt 193 lb (87.5 kg)   SpO2 99%   BMI 36.47 kg/m  Wt Readings from Last 3 Encounters:  09/05/20 193 lb (87.5 kg)  05/30/20 210 lb 3.2 oz (95.3 kg)  04/28/20  208 lb (94.3 kg)    Health Maintenance Due  Topic Date Due   HIV Screening  Never done   Hepatitis C Screening  Never done   Zoster Vaccines- Shingrix (1 of 2) Never done   PAP SMEAR-Modifier  01/22/2016   COLONOSCOPY (Pts 45-58yr Insurance coverage will need to be confirmed)  05/14/2016   Pneumococcal Vaccine [id number] old (2 - PCV) 11/05/2016   COVID-19 Vaccine (3 - Moderna risk series) 09/20/2019   FOOT EXAM  12/10/2019   INFLUENZA VACCINE  08/21/2020    There are no preventive care reminders to display for this patient.   Lab Results  Component Value Date   TSH 1.470 10/29/2019   Lab Results  Component Value Date   WBC 11.1 (H) 04/28/2020   HGB 13.6 04/28/2020   HCT 41.9 04/28/2020   MCV 91 04/28/2020   PLT 487 (H) 04/28/2020   Lab Results  Component Value Date   NA 138 04/28/2020   K 3.9 04/28/2020   CO2 27 04/28/2020   GLUCOSE 106 (H) 04/28/2020   BUN 10 04/28/2020   CREATININE 0.76 04/28/2020   BILITOT 0.7 04/28/2020   ALKPHOS 75 04/28/2020   AST 22 04/28/2020   ALT 22 04/28/2020   PROT 6.7 04/28/2020   ALBUMIN 4.5 04/28/2020   CALCIUM 9.9 04/28/2020   EGFR 89 04/28/2020   Lab Results  Component Value Date   CHOL 200 (  H) 04/28/2020   Lab Results  Component Value Date   HDL 44 04/28/2020   Lab Results  Component Value Date   LDLCALC 123 (H) 04/28/2020   Lab Results  Component Value Date   TRIG 186 (H) 04/28/2020   Lab Results  Component Value Date   CHOLHDL 4.5 (H) 04/28/2020   Lab Results  Component Value Date   HGBA1C 6.6 (H) 04/28/2020       Assessment & Plan:  1. Diabetic polyneuropathy associated with type 2 diabetes mellitus (HCC) Control: good Recommend check sugars fasting daily. Recommend check feet daily. Recommend annual eye exams. Medicines: no changes.  Continue to work on eating a healthy diet and exercise.  Labs drawn today.   - Hemoglobin A1c - POCT UA - Microalbumin  2. Essential hypertension Increase  diltiazem 90 mg 2 pills twice a day until valsartan comes in then decrease diltiazem 90 mg one pill twice daily. Start valsartan 80 mg once daily.  - CBC with Differential/Platelet - Comprehensive metabolic panel - valsartan (DIOVAN) 80 MG tablet; Take 1 tablet (80 mg total) by mouth daily.  Dispense: 90 tablet; Refill: 3 - TSH - EKG 12-Lead  3. Mixed hyperlipidemia Return for FLP in 1 month   4. History of heart attack   Meds ordered this encounter  Medications   valsartan (DIOVAN) 80 MG tablet    Sig: Take 1 tablet (80 mg total) by mouth daily.    Dispense:  90 tablet    Refill:  3     Orders Placed This Encounter  Procedures   CBC with Differential/Platelet   Hemoglobin A1c   Comprehensive metabolic panel   TSH   POCT UA - Microalbumin   EKG 12-Lead   HM DIABETES EYE EXAM     Follow-up: Return in about 4 weeks (around 10/03/2020) for fasting.  An After Visit Summary was printed and given to the patient.  [name], MD [facility] [phone]

## 2020-09-06 LAB — COMPREHENSIVE METABOLIC PANEL
ALT: 13 IU/L (ref 0–32)
AST: 18 IU/L (ref 0–40)
Albumin/Globulin Ratio: 1.8 (ref 1.2–2.2)
Albumin: 4.9 g/dL — ABNORMAL HIGH (ref 3.8–4.8)
Alkaline Phosphatase: 94 IU/L (ref 44–121)
BUN/Creatinine Ratio: 22 (ref 12–28)
BUN: 22 mg/dL (ref 8–27)
Bilirubin Total: 0.8 mg/dL (ref 0.0–1.2)
CO2: 25 mmol/L (ref 20–29)
Calcium: 10.5 mg/dL — ABNORMAL HIGH (ref 8.7–10.3)
Chloride: 94 mmol/L — ABNORMAL LOW (ref 96–106)
Creatinine, Ser: 0.99 mg/dL (ref 0.57–1.00)
Globulin, Total: 2.7 g/dL (ref 1.5–4.5)
Glucose: 102 mg/dL — ABNORMAL HIGH (ref 65–99)
Potassium: 4.1 mmol/L (ref 3.5–5.2)
Sodium: 136 mmol/L (ref 134–144)
Total Protein: 7.6 g/dL (ref 6.0–8.5)
eGFR: 64 mL/min/{1.73_m2} (ref >59)

## 2020-09-06 LAB — CBC WITH DIFFERENTIAL/PLATELET
Basophils Absolute: 0.1 10*3/uL (ref 0.0–0.2)
Basos: 0 %
EOS (ABSOLUTE): 0.1 10*3/uL (ref 0.0–0.4)
Eos: 1 %
Hematocrit: 45.3 % (ref 34.0–46.6)
Hemoglobin: 15.4 g/dL (ref 11.1–15.9)
Immature Grans (Abs): 0.2 10*3/uL — ABNORMAL HIGH (ref 0.0–0.1)
Immature Granulocytes: 1 %
Lymphocytes Absolute: 3.2 10*3/uL — ABNORMAL HIGH (ref 0.7–3.1)
Lymphs: 17 %
MCH: 29.1 pg (ref 26.6–33.0)
MCHC: 34 g/dL (ref 31.5–35.7)
MCV: 86 fL (ref 79–97)
Monocytes Absolute: 1.8 10*3/uL — ABNORMAL HIGH (ref 0.1–0.9)
Monocytes: 10 %
Neutrophils Absolute: 13.4 10*3/uL — ABNORMAL HIGH (ref 1.4–7.0)
Neutrophils: 71 %
Platelets: 582 10*3/uL — ABNORMAL HIGH (ref 150–450)
RBC: 5.3 x10E6/uL — ABNORMAL HIGH (ref 3.77–5.28)
RDW: 13 % (ref 11.7–15.4)
WBC: 18.8 10*3/uL — ABNORMAL HIGH (ref 3.4–10.8)

## 2020-09-06 LAB — TSH: TSH: 1.31 u[IU]/mL (ref 0.450–4.500)

## 2020-09-06 LAB — HEMOGLOBIN A1C
Est. average glucose Bld gHb Est-mCnc: 137 mg/dL
Hgb A1c MFr Bld: 6.4 % — ABNORMAL HIGH (ref 4.8–5.6)

## 2020-09-07 ENCOUNTER — Other Ambulatory Visit: Payer: Self-pay

## 2020-09-07 DIAGNOSIS — R7989 Other specified abnormal findings of blood chemistry: Secondary | ICD-10-CM

## 2020-09-08 ENCOUNTER — Telehealth: Payer: Self-pay | Admitting: Oncology

## 2020-09-08 NOTE — Telephone Encounter (Signed)
Est patient referred by Dr [name] for Abnormal CBC.  Appt made 09/12/20 Labs 9:30 am - Follow Up 10:00 am

## 2020-09-11 ENCOUNTER — Other Ambulatory Visit: Payer: Self-pay | Admitting: Oncology

## 2020-09-11 DIAGNOSIS — D72829 Elevated white blood cell count, unspecified: Secondary | ICD-10-CM

## 2020-09-12 ENCOUNTER — Encounter: Payer: Self-pay | Admitting: Oncology

## 2020-09-12 ENCOUNTER — Inpatient Hospital Stay: Payer: Medicare HMO | Attending: Oncology

## 2020-09-12 ENCOUNTER — Other Ambulatory Visit: Payer: Self-pay

## 2020-09-12 ENCOUNTER — Inpatient Hospital Stay: Payer: Medicare HMO | Admitting: Oncology

## 2020-09-12 ENCOUNTER — Other Ambulatory Visit: Payer: Self-pay | Admitting: Oncology

## 2020-09-12 DIAGNOSIS — D72823 Leukemoid reaction: Secondary | ICD-10-CM | POA: Diagnosis not present

## 2020-09-12 DIAGNOSIS — D75839 Thrombocytosis, unspecified: Secondary | ICD-10-CM

## 2020-09-12 DIAGNOSIS — D72829 Elevated white blood cell count, unspecified: Secondary | ICD-10-CM

## 2020-09-12 DIAGNOSIS — Z86010 Personal history of colonic polyps: Secondary | ICD-10-CM | POA: Diagnosis not present

## 2020-09-12 DIAGNOSIS — D7289 Other specified disorders of white blood cells: Secondary | ICD-10-CM | POA: Diagnosis not present

## 2020-09-12 LAB — HEPATIC FUNCTION PANEL
ALT: 18 (ref 7–35)
AST: 27 (ref 13–35)
Alkaline Phosphatase: 85 (ref 25–125)
Bilirubin, Total: 0.9

## 2020-09-12 LAB — BASIC METABOLIC PANEL
BUN: 23 — AB (ref 4–21)
CO2: 29 — AB (ref 13–22)
Chloride: 99 (ref 99–108)
Creatinine: 1.3 — AB (ref 0.5–1.1)
Glucose: 157
Potassium: 4.3 (ref 3.4–5.3)
Sodium: 136 — AB (ref 137–147)

## 2020-09-12 LAB — CBC AND DIFFERENTIAL
HCT: 42 (ref 36–46)
Hemoglobin: 14 (ref 12.0–16.0)
Neutrophils Absolute: 9.66
Platelets: 409 — AB (ref 150–399)
WBC: 14.2

## 2020-09-12 LAB — CBC: RBC: 4.6 (ref 3.87–5.11)

## 2020-09-12 LAB — COMPREHENSIVE METABOLIC PANEL
Albumin: 4.2 (ref 3.5–5.0)
Calcium: 8.9 (ref 8.7–10.7)

## 2020-09-12 NOTE — Progress Notes (Signed)
[facility]  [address] [place],  [place]  [address] [phone]  Clinic Day:  09/12/2020  Referring physician: [name], MD  This document serves as a record of services personally performed by Hosie Poisson, MD. It was created on their behalf by Curry,Lauren E, a trained medical scribe. The creation of this record is based on the scribe's personal observations and the provider's statements to them.  CHIEF COMPLAINT:  CC: Leukocytosis  Current Treatment:  Surveillance   HISTORY OF PRESENT ILLNESS:  Andrea White is a 65 y.o. female with leukocytosis.  When I look back to her blood counts from September of 2019, she had a white count of 10,000 with a normal hemoglobin and a platelet count of 458,000. In November of 2019, her white count was still 10.2 with a hemoglobin of 13.9 and a platelet count of 431,000. When she had her labs checked on March 24, 2018, her white count was up to 16.0 with 57% neutrophils, 30% lymphocytes, 9% monocytes, and 1% basophils.  There was a notation of possible immature granulocytes.  Her hemoglobin was 14.7 with an MCV of 90 and platelet count was up to 519,000. Her comprehensive metabolic profile was normal other than a total bilirubin of 1.3.  Lipid profile was normal and hemoglobin A1c was 7.0.  When I question the patient, she has had multiple steroid injections, 3 in her neck and 1 of her left foot as well as 1 in her back.  She has multiple sources of chronic pain including a Morton's neuroma and fusion of her ankle, as well as degenerative changes of her spine.  When I saw her in March of 2020, her white count was down to 11.5, but her platelets were elevated at 479,000 with a hemoglobin of 14.9, and normal differential.  I did an evaluation including testing for JAK2 mutation which was negative.  ANA and rheumatoid factor were negative.  SPEP was negative for monoclonal spike.  Iron studies were suggestive of  partial iron deficiency.  I did not feel a bone marrow was indicated and recommended observation, but I do suspect a myeloproliferative syndrome.  INTERVAL HISTORY:  Andrea White has been referred back by Dr. Tobie Poet due to an abnormal CBC.  Lab work up from August 16th revealed a white count of 18.8 with an ANC of 13.4, 17% lymphocytes, 10% monocytes, 1% eosinophils, 0% basophils, and 1% immature granulocytes.  Platelets were also elevated at 582,000, and hemoglobin was high normal at 15.4.  She did receive a steroid injections of the cervical spine on August 11th, which is likely the source of her worsening leukocytosis.  She continues injections every 3 months for her chronic pain.  She denies recent infections.  She does have a morning productive cough.  Her white count today is 14.2 with an ANC of 9.66, platelets are 409,000 and hemoglobin is normal.  Chemistries are unremarkable except for a BUN of 23 and a creatinine of 1.3, which was normal last week.  Her calcium is back to normal today.  She knows to push fluids.  Her  appetite is good, and she has lost 21 pounds since her last visit in 2020.  She denies fever, chills or other signs of infection.  She denies nausea, vomiting, bowel issues, or abdominal pain.  She denies sore throat, cough, dyspnea, or chest pain.  REVIEW OF SYSTEMS:  Review of Systems  Constitutional: Negative.  Negative for appetite change, chills, fatigue, fever and  unexpected weight change.  HENT:  Negative.    Eyes: Negative.   Respiratory:  Positive for cough (in the mornings, productive). Negative for chest tightness, hemoptysis, shortness of breath and wheezing.   Cardiovascular: Negative.  Negative for chest pain, leg swelling and palpitations.  Gastrointestinal: Negative.  Negative for abdominal distention, abdominal pain, blood in stool, constipation, diarrhea, nausea and vomiting.  Endocrine: Negative.   Genitourinary: Negative.  Negative for difficulty urinating, dysuria,  frequency and hematuria.   Musculoskeletal:  Positive for arthralgias (chronic), back pain, myalgias (chronic) and neck pain. Negative for flank pain and gait problem.  Skin: Negative.   Neurological: Negative.  Negative for dizziness, extremity weakness, gait problem, headaches, light-headedness, numbness, seizures and speech difficulty.  Hematological: Negative.   Psychiatric/Behavioral: Negative.  Negative for depression and sleep disturbance. The patient is not nervous/anxious.     VITALS:  Blood pressure 138/79, pulse 83, temperature 97.8 F (36.6 C), temperature source Oral, resp. rate 18, weight 193 lb 4.8 oz (87.7 kg), SpO2 97 %.  Wt Readings from Last 3 Encounters:  09/12/20 193 lb 4.8 oz (87.7 kg)  09/05/20 193 lb (87.5 kg)  05/30/20 210 lb 3.2 oz (95.3 kg)    Body mass index is 36.52 kg/m.  Performance status (ECOG): 1 - Symptomatic but completely ambulatory  PHYSICAL EXAM:  Physical Exam Constitutional:      General: She is not in acute distress.    Appearance: Normal appearance. She is normal weight.  HENT:     Head: Normocephalic and atraumatic.  Eyes:     General: No scleral icterus.    Extraocular Movements: Extraocular movements intact.     Conjunctiva/sclera: Conjunctivae normal.     Pupils: Pupils are equal, round, and reactive to light.  Cardiovascular:     Rate and Rhythm: Normal rate and regular rhythm.     Pulses: Normal pulses.     Heart sounds: Normal heart sounds. No murmur heard.   No friction rub. No gallop.  Pulmonary:     Effort: Pulmonary effort is normal. No respiratory distress.     Breath sounds: Normal breath sounds.  Abdominal:     General: Bowel sounds are normal. There is no distension.     Palpations: Abdomen is soft. There is splenomegaly (3 finger breadths below the left costal margin, firm and mildly tender). There is no hepatomegaly or mass.     Tenderness: There is no abdominal tenderness.  Musculoskeletal:        General:  Normal range of motion.     Cervical back: Normal range of motion and neck supple.     Right lower leg: No edema.     Left lower leg: No edema.  Lymphadenopathy:     Cervical: No cervical adenopathy.  Skin:    General: Skin is warm and dry.  Neurological:     General: No focal deficit present.     Mental Status: She is alert and oriented to person, place, and time. Mental status is at baseline.  Psychiatric:        Mood and Affect: Mood normal.        Behavior: Behavior normal.        Thought Content: Thought content normal.        Judgment: Judgment normal.    LABS:   CBC Latest Ref Rng & Units 09/12/2020 09/05/2020 04/28/2020  WBC - 14.2 18.8(H) 11.1(H)  Hemoglobin 12.0 - 16.0 14.0 15.4 13.6  Hematocrit 36 - 46 42 45.3 41.9  Platelets 150 - 399 409(A) 582(H) 487(H)   CMP Latest Ref Rng & Units 09/12/2020 09/05/2020 04/28/2020  Glucose 65 - 99 mg/dL - 102(H) 106(H)  BUN 4 - 21 23(A) 22 10  Creatinine 0.5 - 1.1 1.3(A) 0.99 0.76  Sodium 137 - 147 136(A) 136 138  Potassium 3.4 - 5.3 4.3 4.1 3.9  Chloride 99 - 108 99 94(L) 94(L)  CO2 13 - 22 29(A) 25 27  Calcium 8.7 - 10.7 8.9 10.5(H) 9.9  Total Protein 6.0 - 8.5 g/dL - 7.6 6.7  Total Bilirubin 0.0 - 1.2 mg/dL - 0.8 0.7  Alkaline Phos 25 - 125 85 94 75  AST 13 - 35 $Re'27 18 22  'luA$ ALT 7 - 35 $Re'18 13 22     'dZK$ STUDIES:  Mammogram 3D SCREEN BREAST BILATERAL  Result Date: 08/18/2020 CLINICAL DATA:  Screening. EXAM: DIGITAL SCREENING BILATERAL MAMMOGRAM WITH TOMOSYNTHESIS AND CAD TECHNIQUE: Bilateral screening digital craniocaudal and mediolateral oblique mammograms were obtained. Bilateral screening digital breast tomosynthesis was performed. The images were evaluated with computer-aided detection. COMPARISON:  Previous exam(s). ACR Breast Density Category a: The breast tissue is almost entirely fatty. FINDINGS: There are no findings suspicious for malignancy. IMPRESSION: No mammographic evidence of malignancy. A result letter of this screening  mammogram will be mailed directly to the patient. RECOMMENDATION: Screening mammogram in one year. (Code:SM-B-01Y) BI-RADS CATEGORY  1: Negative. Electronically Signed   By: Audie Pinto M.D.   On: 08/18/2020 15:45     Allergies:  Allergies  Allergen Reactions   Morphine And Related Other (See Comments)    Woozy, "out of body experience"   Celebrex [Celecoxib] Other (See Comments)    Patient does not remember intolerance   Gabapentin Other (See Comments)    Confusion   Lisinopril Cough   Guaifenesin & Derivatives Other (See Comments)    Patient cannot remember specific issue, just made her feel "weird"   Lipitor [Atorvastatin] Rash   Sulfa Antibiotics Rash   Valium [Diazepam] Other (See Comments)    Lightheaded, dizziness     Current Medications: Current Outpatient Medications  Medication Sig Dispense Refill   ALPRAZolam (XANAX) 0.5 MG tablet TAKE 1/2 TO 1 TABLET BY MOUTH EVERY 8 HOURS AS NEEDED. 30 tablet 5   APPLE CIDER VINEGAR PO Take by mouth daily.     Biotin 10000 MCG TABS Take 1 tablet by mouth daily.     Blood Glucose Monitoring Suppl (TRUE METRIX METER) w/Device KIT USE AS DIRECTED 1 kit 0   diclofenac (VOLTAREN) 75 MG EC tablet Take 75 mg by mouth 2 (two) times daily.     diclofenac Sodium (VOLTAREN) 1 % GEL Apply topically 4 (four) times daily as needed.     diltiazem (CARDIZEM) 90 MG tablet Take 90 mg by mouth 2 (two) times daily. Taking 1.5 tablets     DULoxetine (CYMBALTA) 60 MG capsule TAKE 1 CAPSULE EVERY DAY 90 capsule 1   glucose blood (TRUE METRIX BLOOD GLUCOSE TEST) test strip USE AS DIRECTED TO CHECK FASTING BLOOD SUGAR 300 strip 2   loratadine (CLARITIN) 10 MG tablet Take 1 tablet (10 mg total) by mouth daily. 90 tablet 3   meclizine (ANTIVERT) 25 MG tablet TAKE 1 TABLET TWICE DAILY  AS  NEEDED  FOR  DIZZINESS. 180 tablet 1   metFORMIN (GLUCOPHAGE) 500 MG tablet TAKE 1 TABLET TWICE DAILY WITH MEALS 180 tablet 1   Multiple Vitamin (MULTIVITAMIN) tablet  Take 1 tablet by mouth daily.  omeprazole (PRILOSEC) 40 MG capsule TAKE 1 CAPSULE (40 MG TOTAL) BY MOUTH 2 (TWO) TIMES DAILY. 180 capsule 1   oxybutynin (DITROPAN) 5 MG tablet Take 1 tablet (5 mg total) by mouth 3 (three) times daily. (Patient taking differently: Take 5 mg by mouth 2 (two) times daily.) 270 tablet 0   pravastatin (PRAVACHOL) 20 MG tablet Take 1 tablet (20 mg total) by mouth daily. 90 tablet 0   PROVENTIL HFA 108 (90 Base) MCG/ACT inhaler INHALE 2 PUFFS BY MOUTH FOUR TIMES A DAY AS NEEDED FOR WHEEZING 1 each 2   traMADol (ULTRAM) 50 MG tablet Take 1 tablet (50 mg total) by mouth 3 (three) times daily as needed. 90 tablet 1   TRUEplus Lancets 33G MISC E11.69 use new lancet each time when checking FBS 100 each 2   valsartan (DIOVAN) 80 MG tablet Take 1 tablet (80 mg total) by mouth daily. 90 tablet 3   No current facility-administered medications for this visit.     ASSESSMENT & PLAN:   Assessment:   1. Leukocytosis which is improved, but fluctuates constantly.  Her white count has improved to 14.2 today.  I do feel this likely represents a form of myeloproliferative syndrome, but as this has remained fairly stable, we will defer bone marrow evaluation.  Previous JAK 2 mutation was negative. I will see if we can draw a myeloproliferative neoplasm panel from her peripheral blood.  2. Thrombocytosis.  This could represent essential thrombocythemia and the JAK 2 mutation is only positive 50% of the time.  This would mainly be diagnosed by following her over time.  The other cause of thrombocytosis can be iron deficiency but her iron studies in the past have been adequate.  3. History of adenomatous colon polyps.  I do think she needs to continue follow up with Dr. Melina Copa.  4.  Dehydration with associated renal insufficiency.  She knows to push fluids.  5.  Mild splenomegaly.  This is likely related to her myeloproliferative syndrome.  Plan: I do feel that she likely has a  form of myeloproliferative syndrome but it has been fairly stable for several years and does not require intervention.  As a bone marrow biopsy would not change our treatment plan, we will defer.  We will consider a myeloproliferative neoplasm panel.  I would recommend that she continue to have her CBC monitored periodically.  If her leukocytosis or thrombocytosis progress significantly, I would be glad to see her back as needed.  I have explained this to the patient, and her daughter and they understand and agree with this plan of care.  I have answered their questions and they know to call with any concerns.   I provided 35 minutes of face-to-face time during this this encounter and > 50% was spent counseling as documented under my assessment and plan.    [name], MD [facility] AT [facility] [address] [place] [address] Dept: [phone] Dept Fax: [phone]   I, Rita Ohara, am acting as scribe for [name], MD  I have reviewed this report as typed by the medical scribe, and it is complete and accurate.

## 2020-09-13 ENCOUNTER — Other Ambulatory Visit: Payer: Self-pay

## 2020-09-13 MED ORDER — DILTIAZEM HCL 90 MG PO TABS: 90.0000 mg | ORAL_TABLET | Freq: Two times a day (BID) | ORAL | 0 refills | Status: DC

## 2020-09-15 ENCOUNTER — Encounter: Payer: Self-pay | Admitting: Oncology

## 2020-09-18 ENCOUNTER — Other Ambulatory Visit: Payer: Self-pay | Admitting: Family Medicine

## 2020-09-18 MED ORDER — DILTIAZEM HCL 90 MG PO TABS: 90.0000 mg | ORAL_TABLET | Freq: Two times a day (BID) | ORAL | 1 refills | Status: DC

## 2020-09-20 ENCOUNTER — Other Ambulatory Visit: Payer: Self-pay | Admitting: Family Medicine

## 2020-09-22 ENCOUNTER — Telehealth: Payer: Self-pay

## 2020-09-22 NOTE — Chronic Care Management (AMB) (Signed)
Chronic Care Management Pharmacy Assistant   Name: Andrea White  MRN: [id number] DOB: 28-May-1955  Reason for Encounter: Disease State/ Hypertension  Recent office visits:  09-05-2020 Andrea Brome, MD. START Valsartan 80 mg daily. WBC= 18.8, RBC= 5.30, Platelets= 582, Neutrophils absolute= 13.4, Lymphocytes= 3.2, Monocytes= 1.8, Immature Grans (ABS)= 0.2  Recent consult visits:  09-12-2020 Andrea Kaplan, MD (Oncology). STOP Aldactazide.  Hospital visits:  None in previous 6 months  Medications: Outpatient Encounter Medications as of 09/22/2020  Medication Sig   ALPRAZolam (XANAX) 0.5 MG tablet TAKE 1/2 TO 1 TABLET BY MOUTH EVERY 8 HOURS AS NEEDED.   APPLE CIDER VINEGAR PO Take by mouth daily.   Biotin 10000 MCG TABS Take 1 tablet by mouth daily.   Blood Glucose Monitoring Suppl (TRUE METRIX METER) w/Device KIT USE AS DIRECTED   diclofenac (VOLTAREN) 75 MG EC tablet Take 75 mg by mouth 2 (two) times daily.   diclofenac Sodium (VOLTAREN) 1 % GEL Apply topically 4 (four) times daily as needed.   diltiazem (CARDIZEM) 90 MG tablet Take 1 tablet (90 mg total) by mouth 2 (two) times daily. Take 1 tablet (90 mg total) by mouth 2 (two) times daily.   DULoxetine (CYMBALTA) 60 MG capsule TAKE 1 CAPSULE EVERY DAY   glucose blood (TRUE METRIX BLOOD GLUCOSE TEST) test strip USE AS DIRECTED TO CHECK FASTING BLOOD SUGAR   loratadine (CLARITIN) 10 MG tablet Take 1 tablet (10 mg total) by mouth daily.   meclizine (ANTIVERT) 25 MG tablet TAKE 1 TABLET TWICE DAILY  AS  NEEDED  FOR  DIZZINESS.   metFORMIN (GLUCOPHAGE) 500 MG tablet TAKE 1 TABLET TWICE DAILY WITH MEALS   Multiple Vitamin (MULTIVITAMIN) tablet Take 1 tablet by mouth daily.   omeprazole (PRILOSEC) 40 MG capsule TAKE 1 CAPSULE (40 MG TOTAL) BY MOUTH 2 (TWO) TIMES DAILY.   oxybutynin (DITROPAN) 5 MG tablet TAKE 1 TABLET THREE TIMES DAILY   pravastatin (PRAVACHOL) 20 MG tablet Take 1 tablet (20 mg total) by mouth daily.    PROVENTIL HFA 108 (90 Base) MCG/ACT inhaler INHALE 2 PUFFS BY MOUTH FOUR TIMES A DAY AS NEEDED FOR WHEEZING   traMADol (ULTRAM) 50 MG tablet Take 1 tablet (50 mg total) by mouth 3 (three) times daily as needed.   TRUEplus Lancets 33G MISC E11.69 use new lancet each time when checking FBS   valsartan (DIOVAN) 80 MG tablet Take 1 tablet (80 mg total) by mouth daily.   No facility-administered encounter medications on file as of 09/22/2020.    Recent Office Vitals: BP Readings from Last 3 Encounters:  09/12/20 138/79  09/05/20 (!) 162/92  05/30/20 138/82   Pulse Readings from Last 3 Encounters:  09/12/20 83  09/05/20 78  05/30/20 72    Wt Readings from Last 3 Encounters:  09/12/20 193 lb 4.8 oz (87.7 kg)  09/05/20 193 lb (87.5 kg)  05/30/20 210 lb 3.2 oz (95.3 kg)     Kidney Function Lab Results  Component Value Date/Time   CREATININE 1.3 (A) 09/12/2020 12:00 AM   CREATININE 0.99 09/05/2020 02:48 PM   CREATININE 0.76 04/28/2020 08:47 AM   GFRNONAA 73 10/29/2019 08:50 AM   GFRAA 84 10/29/2019 08:50 AM    BMP Latest Ref Rng & Units 09/12/2020 09/05/2020 04/28/2020  Glucose 65 - 99 mg/dL - 102(H) 106(H)  BUN 4 - 21 23(A) 22 10  Creatinine 0.5 - 1.1 1.3(A) 0.99 0.76  BUN/Creat Ratio 12 - 28 - 22 13  Sodium 137 - 147 136(A) 136 138  Potassium 3.4 - 5.3 4.3 4.1 3.9  Chloride 99 - 108 99 94(L) 94(L)  CO2 13 - 22 29(A) 25 27  Calcium 8.7 - 10.7 8.9 10.5(H) 9.9    09-22-2020: 1st attempt left VM 09-29-2020: 2nd attempt left VM  Care Gaps: Last annual wellness visit? 05-30-2020 Hep C screening overdue Shingrix overdue PAP overdue Colonoscopy overdue Pneumococcal overdue Yearly foot exam= 10-04-2020 RAF= 0.943%  Star Rating Drugs:  Medication:  Last Fill: Day Supply Valsartan 80 mg daily 09-05-2020 90 Pravastatin 20 mg 08-09-2020 90 Metformin 500 mg 09-21-2020 Andrea White Clinical Pharmacist Assistant [phone]

## 2020-09-26 ENCOUNTER — Telehealth: Payer: Self-pay

## 2020-09-26 NOTE — Telephone Encounter (Signed)
I called patient to sch her for lab test that we now have arth. per Butch Penny. Pt answered and identified x2  (name,dob). Pt stated she can come in on tomorrow's date at 1015. Same was scheduled. Patient agreed. Call complete.

## 2020-09-26 NOTE — Progress Notes (Unsigned)
Approval [id number]    Received a call from [facility]  confirming authorization approval for CPT 48350; Valid 09/12/2020 - 12/10/2020; 1 visit

## 2020-09-27 ENCOUNTER — Inpatient Hospital Stay: Payer: Medicare HMO

## 2020-10-03 ENCOUNTER — Ambulatory Visit: Payer: Medicare HMO | Admitting: Family Medicine

## 2020-10-04 ENCOUNTER — Other Ambulatory Visit: Payer: Self-pay

## 2020-10-04 ENCOUNTER — Ambulatory Visit: Payer: Medicare HMO | Admitting: Sports Medicine

## 2020-10-04 ENCOUNTER — Encounter: Payer: Self-pay | Admitting: Sports Medicine

## 2020-10-04 ENCOUNTER — Encounter: Payer: Self-pay | Admitting: Nurse Practitioner

## 2020-10-04 ENCOUNTER — Other Ambulatory Visit: Payer: Self-pay | Admitting: Nurse Practitioner

## 2020-10-04 ENCOUNTER — Ambulatory Visit (INDEPENDENT_AMBULATORY_CARE_PROVIDER_SITE_OTHER): Payer: Medicare HMO | Admitting: Nurse Practitioner

## 2020-10-04 VITALS — BP 112/80 | HR 75 | Temp 94.5°F | Ht 62.0 in | Wt 194.0 lb

## 2020-10-04 DIAGNOSIS — I10 Essential (primary) hypertension: Secondary | ICD-10-CM | POA: Diagnosis not present

## 2020-10-04 DIAGNOSIS — M79674 Pain in right toe(s): Secondary | ICD-10-CM

## 2020-10-04 DIAGNOSIS — T63421A Toxic effect of venom of ants, accidental (unintentional), initial encounter: Secondary | ICD-10-CM | POA: Diagnosis not present

## 2020-10-04 DIAGNOSIS — B351 Tinea unguium: Secondary | ICD-10-CM

## 2020-10-04 DIAGNOSIS — M79675 Pain in left toe(s): Secondary | ICD-10-CM | POA: Diagnosis not present

## 2020-10-04 DIAGNOSIS — E1142 Type 2 diabetes mellitus with diabetic polyneuropathy: Secondary | ICD-10-CM

## 2020-10-04 DIAGNOSIS — J3089 Other allergic rhinitis: Secondary | ICD-10-CM

## 2020-10-04 MED ORDER — TRIAMCINOLONE ACETONIDE 40 MG/ML IJ SUSP
60.0000 mg | Freq: Once | INTRAMUSCULAR | Status: AC
  Administered 2020-10-04: 60 mg via INTRAMUSCULAR

## 2020-10-04 MED ORDER — LORATADINE 10 MG PO TABS: 10.0000 mg | ORAL_TABLET | Freq: Every day | ORAL | 3 refills | Status: DC

## 2020-10-04 NOTE — Progress Notes (Signed)
Subjective: Andrea White is a 65 y.o. female patient who returns to office for diabetic nail care.  Patient reports that her A1C 6.4 and blood sugar ranges from 90-1 24 but not recorded this morning, last visit with PCP, Dr. Sedalia Muta was this morning.  No other acute issues noted.  Patient Active Problem List   Diagnosis Date Noted   Hx of adenomatous colonic polyps 09/12/2020    Class: History of   Diabetic polyneuropathy associated with type 2 diabetes mellitus (HCC) 04/30/2019   Essential hypertension 04/30/2019   Mixed hyperlipidemia 04/30/2019   Mixed diabetic hyperlipidemia associated with type 2 diabetes mellitus (HCC) 04/30/2019   Major depressive disorder, single episode, mild (HCC) 04/30/2019   Leukocytosis 04/17/2018    Class: Minor   Thrombocytosis, unspecified 04/17/2018    Class: Diagnosis of   Myocardial infarction (HCC) 10/31/2017   Primary fibromyalgia syndrome 10/31/2017   Gastroesophageal reflux disease 03/15/2015   Asthma 03/15/2015   Obesity 03/15/2015   Herniated disc 06/10/2011   History of heart attack 06/10/2011   Cyst of ovary     Current Outpatient Medications on File Prior to Visit  Medication Sig Dispense Refill   ALPRAZolam (XANAX) 0.5 MG tablet TAKE 1/2 TO 1 TABLET BY MOUTH EVERY 8 HOURS AS NEEDED. 30 tablet 5   APPLE CIDER VINEGAR PO Take by mouth daily.     Biotin 44920 MCG TABS Take 1 tablet by mouth daily.     Blood Glucose Monitoring Suppl (TRUE METRIX METER) w/Device KIT USE AS DIRECTED 1 kit 0   diclofenac (VOLTAREN) 75 MG EC tablet Take 75 mg by mouth 2 (two) times daily.     diclofenac Sodium (VOLTAREN) 1 % GEL Apply topically 4 (four) times daily as needed.     diltiazem (CARDIZEM) 90 MG tablet Take 1 tablet (90 mg total) by mouth 2 (two) times daily. Take 1 tablet (90 mg total) by mouth 2 (two) times daily. 180 tablet 1   DULoxetine (CYMBALTA) 60 MG capsule TAKE 1 CAPSULE EVERY DAY 90 capsule 1   glucose blood (TRUE METRIX BLOOD GLUCOSE  TEST) test strip USE AS DIRECTED TO CHECK FASTING BLOOD SUGAR 300 strip 2   meclizine (ANTIVERT) 25 MG tablet TAKE 1 TABLET TWICE DAILY  AS  NEEDED  FOR  DIZZINESS. 180 tablet 1   metFORMIN (GLUCOPHAGE) 500 MG tablet TAKE 1 TABLET TWICE DAILY WITH MEALS 180 tablet 1   Multiple Vitamin (MULTIVITAMIN) tablet Take 1 tablet by mouth daily.     omeprazole (PRILOSEC) 40 MG capsule TAKE 1 CAPSULE (40 MG TOTAL) BY MOUTH 2 (TWO) TIMES DAILY. 180 capsule 1   oxybutynin (DITROPAN) 5 MG tablet TAKE 1 TABLET THREE TIMES DAILY 270 tablet 0   pravastatin (PRAVACHOL) 20 MG tablet Take 1 tablet (20 mg total) by mouth daily. 90 tablet 0   PROVENTIL HFA 108 (90 Base) MCG/ACT inhaler INHALE 2 PUFFS BY MOUTH FOUR TIMES A DAY AS NEEDED FOR WHEEZING 1 each 2   traMADol (ULTRAM) 50 MG tablet Take 1 tablet (50 mg total) by mouth 3 (three) times daily as needed. 90 tablet 1   TRUEplus Lancets 33G MISC E11.69 use new lancet each time when checking FBS 100 each 2   valsartan (DIOVAN) 80 MG tablet Take 1 tablet (80 mg total) by mouth daily. 90 tablet 3   No current facility-administered medications on file prior to visit.    Allergies  Allergen Reactions   Morphine And Related Other (See Comments)  Woozy, "out of body experience"   Celebrex [Celecoxib] Other (See Comments)    Patient does not remember intolerance   Gabapentin Other (See Comments)    Confusion   Lisinopril Cough   Guaifenesin & Derivatives Other (See Comments)    Patient cannot remember specific issue, just made her feel "weird"   Lipitor [Atorvastatin] Rash   Sulfa Antibiotics Rash   Valium [Diazepam] Other (See Comments)    Lightheaded, dizziness     Objective:  General: Alert and oriented x3 in no acute distress  Dermatology: Nails x10 elongated thickened dystrophic consistent with onychomycosis.    Vascular: Dorsalis Pedis and Posterior Tibial pedal pulses palpable, Capillary Fill Time 5 seconds,(+) pedal hair growth bilateral, no edema  bilateral lower extremities, Temperature gradient within normal limits.  Neurology: Johney Maine sensation intact via light touch bilateral.  Musculoskeletal: + pes planus foot type, bunion and hammertoe deformity that is currently asymptomatic.   Assessment and Plan: Problem List Items Addressed This Visit       Endocrine   Diabetic polyneuropathy associated with type 2 diabetes mellitus ([place])   Other Visit Diagnoses     Pain due to onychomycosis of toenails of both feet    -  Primary       -Complete examination performed -Mechanically debrided nails x10 using a sterile nipper without incident -Continue with spacer as tolerated -Rx Budding hammertoe regulator for left 2nd toe  -Continue with good supportive diabetic shoe -Patient to return to office for in 3 months for nail care or sooner if condition worsens.    Landis Martins, DPM

## 2020-10-04 NOTE — Patient Instructions (Addendum)
Kenalog steroid injection 60 mg given in office today Continue medications as prescribed Monitor BP at home Follow-up fasting 42-months or sooner if needed  Insect Bite, Adult An insect bite can make your skin red, itchy, and swollen. Some insects can spread disease to people with a bite. However, most insect bites do not lead to disease, and most are not serious. What are the causes? Insects may bite for many reasons, including: Hunger. To defend themselves. Insects that bite include: Spiders. Mosquitoes. Ticks. Fleas. Ants. Flies. Kissing bugs. Chiggers. What are the signs or symptoms? Symptoms of this condition include: Itching or pain in the bite area. Redness and swelling in the bite area. An open wound (skin ulcer). Symptoms often last for 2-4 days. In rare cases, a person may have a very bad allergic reaction (anaphylactic reaction) to a bite. Symptoms of an anaphylactic reaction may include: Feeling warm in the face (flushed). Your face may turn red. Itchy, red, swollen areas of skin (hives). Swelling of the: Eyes. Lips. Face. Mouth. Tongue. Throat. Trouble with any of these: Breathing. Talking. Swallowing. Loud breathing (wheezing). Feeling dizzy or light-headed. Passing out (fainting). Pain or cramps in your belly. Throwing up (vomiting). Watery poop (diarrhea). How is this treated? Treatment is usually not needed. Symptoms often go away on their own. When treatment is needed, it may involve: Putting a cream or lotion on the bite area. This helps with itching. Taking an antibiotic medicine. This treatment is needed if the bite area gets infected. Getting a tetanus shot, if you are not up to date on this vaccine. Putting ice on the affected area. Using medicines called antihistamines. This treatment may be needed if you have itching or an allergic reaction to the insect bite. Giving yourself a shot of medicine (epinephrine) using an auto-injector "pen" if  you have an anaphylactic reaction to a bite. Your doctor will teach you how to use this pen. Follow these instructions at home: Bite area care  Do not scratch the bite area. Keep the bite area clean and dry. Wash the bite area every day with soap and water as told by your doctor. Check the bite area every day for signs of infection. Check for: Redness, swelling, or pain. Fluid or blood. Warmth. Pus or a bad smell. Managing pain, itching, and swelling  You may put any of these on the bite area as told by your doctor: A paste made of baking soda and water. Cortisone cream. Calamine lotion. If told, put ice on the bite area. Put ice in a plastic bag. Place a towel between your skin and the bag. Leave the ice on for 20 minutes, 2-3 times a day. General instructions Apply or take over-the-counter and prescription medicines only as told by your doctor. If you were prescribed an antibiotic medicine, take or apply it as told by your doctor. Do not stop using the antibiotic even if your condition improves. Keep all follow-up visits as told by your doctor. This is important. How is this prevented? To help you have a lower risk of insect bites: When you are outside, wear clothing that covers your arms and legs. Use insect repellent. The best insect repellents contain one of these: DEET. Picaridin. Oil of lemon eucalyptus (OLE). [id number]. Consider spraying your clothing with a pesticide called permethrin. Permethrin helps prevent insect bites. It works for several weeks and for up to 5-6 clothing washes. Do not apply permethrin directly to the skin. If your home windows do not have screens,  think about putting some in. If you will be sleeping in an area where there are mosquitoes, consider covering your sleeping area with a mosquito net. Contact a doctor if: You have redness, swelling, or pain in the bite area. You have fluid or blood coming from the bite area. The bite area feels warm to  the touch. You have pus or a bad smell coming from the bite area. You have a fever. Get help right away if: You have joint pain. You have a rash. You feel more tired or sleepy than you normally do. You have neck pain. You have a headache. You feel weaker than you normally do. You have signs of an anaphylactic reaction. Signs may include: Feeling warm in the face. Itchy, red, swollen areas of skin. Swelling of your: Eyes. Lips. Face. Mouth. Tongue. Throat. Trouble with any of these: Breathing. Talking. Swallowing. Loud breathing. Feeling dizzy or light-headed. Passing out. Pain or cramps in your belly. Throwing up. Watery poop. These symptoms may be an emergency. Do not wait to see if the symptoms will go away. Do this right away: Use your auto-injector pen as you have been told. Get medical help. Call your local emergency services (911 in the [place] Do not drive yourself to the hospital. Summary An insect bite can make your skin red, itchy, and swollen. Treatment is usually not needed. Symptoms often go away on their own. Do not scratch the bite area. Keep it clean and dry. Ice can help with pain and itching from the bite. This information is not intended to replace advice given to you by your health care provider. Make sure you discuss any questions you have with your health care provider. Document Revised: 11/30/2019 Document Reviewed: 07/18/2017 Elsevier Patient Education  2022 [place] Eating Plan DASH stands for Dietary Approaches to Stop Hypertension. The DASH eating plan is a healthy eating plan that has been shown to: Reduce high blood pressure (hypertension). Reduce your risk for type 2 diabetes, heart disease, and stroke. Help with weight loss. What are tips for following this plan? Reading food labels Check food labels for the amount of salt (sodium) per serving. Choose foods with less than 5 percent of the Daily Value of sodium. Generally, foods  with less than 300 milligrams (mg) of sodium per serving fit into this eating plan. To find whole grains, look for the word "whole" as the first word in the ingredient list. Shopping Buy products labeled as "low-sodium" or "no salt added." Buy fresh foods. Avoid canned foods and pre-made or frozen meals. Cooking Avoid adding salt when cooking. Use salt-free seasonings or herbs instead of table salt or sea salt. Check with your health care provider or pharmacist before using salt substitutes. Do not fry foods. Cook foods using healthy methods such as baking, boiling, grilling, roasting, and broiling instead. Cook with heart-healthy oils, such as olive, canola, avocado, soybean, or sunflower oil. Meal planning  Eat a balanced diet that includes: 4 or more servings of fruits and 4 or more servings of vegetables each day. Try to fill one-half of your plate with fruits and vegetables. 6-8 servings of whole grains each day. Less than 6 oz (170 g) of lean meat, poultry, or fish each day. A 3-oz (85-g) serving of meat is about the same size as a deck of cards. One egg equals 1 oz (28 g). 2-3 servings of low-fat dairy each day. One serving is 1 cup (237 mL). 1 serving of nuts, seeds, or  beans 5 times each week. 2-3 servings of heart-healthy fats. Healthy fats called omega-3 fatty acids are found in foods such as walnuts, flaxseeds, fortified milks, and eggs. These fats are also found in cold-water fish, such as sardines, salmon, and mackerel. Limit how much you eat of: Canned or prepackaged foods. Food that is high in trans fat, such as some fried foods. Food that is high in saturated fat, such as fatty meat. Desserts and other sweets, sugary drinks, and other foods with added sugar. Full-fat dairy products. Do not salt foods before eating. Do not eat more than 4 egg yolks a week. Try to eat at least 2 vegetarian meals a week. Eat more home-cooked food and less restaurant, buffet, and fast  food. Lifestyle When eating at a restaurant, ask that your food be prepared with less salt or no salt, if possible. If you drink alcohol: Limit how much you use to: 0-1 drink a day for women who are not pregnant. 0-2 drinks a day for men. Be aware of how much alcohol is in your drink. In the [place] one drink equals one 12 oz bottle of beer (355 mL), one 5 oz glass of wine (148 mL), or one 1 oz glass of hard liquor (44 mL). General information Avoid eating more than 2,300 mg of salt a day. If you have hypertension, you may need to reduce your sodium intake to 1,500 mg a day. Work with your health care provider to maintain a healthy body weight or to lose weight. Ask what an ideal weight is for you. Get at least 30 minutes of exercise that causes your heart to beat faster (aerobic exercise) most days of the week. Activities may include walking, swimming, or biking. Work with your health care provider or dietitian to adjust your eating plan to your individual calorie needs. What foods should I eat? Fruits All fresh, dried, or frozen fruit. Canned fruit in natural juice (without added sugar). Vegetables Fresh or frozen vegetables (raw, steamed, roasted, or grilled). Low-sodium or reduced-sodium tomato and vegetable juice. Low-sodium or reduced-sodium tomato sauce and tomato paste. Low-sodium or reduced-sodium canned vegetables. Grains Whole-grain or whole-wheat bread. Whole-grain or whole-wheat pasta. Brown rice. [place]. Bulgur. Whole-grain and low-sodium cereals. Pita bread. Low-fat, low-sodium crackers. Whole-wheat flour tortillas. Meats and other proteins Skinless chicken or [place]. Ground chicken or [place]. Pork with fat trimmed off. Fish and seafood. Egg whites. Dried beans, peas, or lentils. Unsalted nuts, nut butters, and seeds. Unsalted canned beans. Lean cuts of beef with fat trimmed off. Low-sodium, lean precooked or cured meat, such as sausages or meat loaves. Dairy Low-fat  (1%) or fat-free (skim) milk. Reduced-fat, low-fat, or fat-free cheeses. Nonfat, low-sodium ricotta or cottage cheese. Low-fat or nonfat yogurt. Low-fat, low-sodium cheese. Fats and oils Soft margarine without trans fats. Vegetable oil. Reduced-fat, low-fat, or light mayonnaise and salad dressings (reduced-sodium). Canola, safflower, olive, avocado, soybean, and sunflower oils. Avocado. Seasonings and condiments Herbs. Spices. Seasoning mixes without salt. Other foods Unsalted popcorn and pretzels. Fat-free sweets. The items listed above may not be a complete list of foods and beverages you can eat. Contact a dietitian for more information. What foods should I avoid? Fruits Canned fruit in a light or heavy syrup. Fried fruit. Fruit in cream or butter sauce. Vegetables Creamed or fried vegetables. Vegetables in a cheese sauce. Regular canned vegetables (not low-sodium or reduced-sodium). Regular canned tomato sauce and paste (not low-sodium or reduced-sodium). Regular tomato and vegetable juice (not low-sodium or reduced-sodium). Angie Fava.  Olives. Grains Baked goods made with fat, such as croissants, muffins, or some breads. Dry pasta or rice meal packs. Meats and other proteins Fatty cuts of meat. Ribs. Fried meat. Berniece Salines. Bologna, salami, and other precooked or cured meats, such as sausages or meat loaves. Fat from the back of a pig (fatback). Bratwurst. Salted nuts and seeds. Canned beans with added salt. Canned or smoked fish. Whole eggs or egg yolks. Chicken or [place] with skin. Dairy Whole or 2% milk, cream, and half-and-half. Whole or full-fat cream cheese. Whole-fat or sweetened yogurt. Full-fat cheese. Nondairy creamers. Whipped toppings. Processed cheese and cheese spreads. Fats and oils Butter. Stick margarine. Lard. Shortening. Ghee. Bacon fat. Tropical oils, such as coconut, palm kernel, or palm oil. Seasonings and condiments Onion salt, garlic salt, seasoned salt, table salt, and sea  salt. Worcestershire sauce. Tartar sauce. Barbecue sauce. Teriyaki sauce. Soy sauce, including reduced-sodium. Steak sauce. Canned and packaged gravies. Fish sauce. Oyster sauce. Cocktail sauce. Store-bought horseradish. Ketchup. Mustard. Meat flavorings and tenderizers. Bouillon cubes. Hot sauces. Pre-made or packaged marinades. Pre-made or packaged taco seasonings. Relishes. Regular salad dressings. Other foods Salted popcorn and pretzels. The items listed above may not be a complete list of foods and beverages you should avoid. Contact a dietitian for more information. Where to find more information National Heart, Lung, and Blood Institute: https://wilson-eaton.com/ American Heart Association: www.heart.org Academy of Nutrition and Dietetics: www.eatright.[place]: www.kidney.org Summary The DASH eating plan is a healthy eating plan that has been shown to reduce high blood pressure (hypertension). It may also reduce your risk for type 2 diabetes, heart disease, and stroke. When on the DASH eating plan, aim to eat more fresh fruits and vegetables, whole grains, lean proteins, low-fat dairy, and heart-healthy fats. With the DASH eating plan, you should limit salt (sodium) intake to 2,300 mg a day. If you have hypertension, you may need to reduce your sodium intake to 1,500 mg a day. Work with your health care provider or dietitian to adjust your eating plan to your individual calorie needs. This information is not intended to replace advice given to you by your health care provider. Make sure you discuss any questions you have with your health care provider. Document Revised: 12/11/2018 Document Reviewed: 12/11/2018 Elsevier Patient Education  2022 [place] Your Hypertension Hypertension, also called high blood pressure, is when the force of the blood pressing against the walls of the arteries is too strong. Arteries are blood vessels that carry blood from your heart  throughout your body. Hypertension forces the heart to work harder to pump blood and may cause the arteries to become narrow or stiff. Understanding blood pressure readings Your personal target blood pressure may vary depending on your medical conditions, your age, and other factors. A blood pressure reading includes a higher number over a lower number. Ideally, your blood pressure should be below 120/80. You should know that: The first, or top, number is called the systolic pressure. It is a measure of the pressure in your arteries as your heart beats. The second, or bottom number, is called the diastolic pressure. It is a measure of the pressure in your arteries as the heart relaxes. Blood pressure is classified into four stages. Based on your blood pressure reading, your health care provider may use the following stages to determine what type of treatment you need, if any. Systolic pressure and diastolic pressure are measured in a unit called mmHg. Normal Systolic pressure: below 756. Diastolic pressure:  below 80. Elevated Systolic pressure: 185-631. Diastolic pressure: below 80. Hypertension stage 1 Systolic pressure: 497-026. Diastolic pressure: 37-85. Hypertension stage 2 Systolic pressure: 885 or above. Diastolic pressure: 90 or above. How can this condition affect me? Managing your hypertension is an important responsibility. Over time, hypertension can damage the arteries and decrease blood flow to important parts of the body, including the brain, heart, and kidneys. Having untreated or uncontrolled hypertension can lead to: A heart attack. A stroke. A weakened blood vessel (aneurysm). Heart failure. Kidney damage. Eye damage. Metabolic syndrome. Memory and concentration problems. Vascular dementia. What actions can I take to manage this condition? Hypertension can be managed by making lifestyle changes and possibly by taking medicines. Your health care provider will help you  make a plan to bring your blood pressure within a normal range. Nutrition  Eat a diet that is high in fiber and potassium, and low in salt (sodium), added sugar, and fat. An example eating plan is called the Dietary Approaches to Stop Hypertension (DASH) diet. To eat this way: Eat plenty of fresh fruits and vegetables. Try to fill one-half of your plate at each meal with fruits and vegetables. Eat whole grains, such as whole-wheat pasta, brown rice, or whole-grain bread. Fill about one-fourth of your plate with whole grains. Eat low-fat dairy products. Avoid fatty cuts of meat, processed or cured meats, and poultry with skin. Fill about one-fourth of your plate with lean proteins such as fish, chicken without skin, beans, eggs, and tofu. Avoid pre-made and processed foods. These tend to be higher in sodium, added sugar, and fat. Reduce your daily sodium intake. Most people with hypertension should eat less than 1,500 mg of sodium a day. Lifestyle  Work with your health care provider to maintain a healthy body weight or to lose weight. Ask what an ideal weight is for you. Get at least 30 minutes of exercise that causes your heart to beat faster (aerobic exercise) most days of the week. Activities may include walking, swimming, or biking. Include exercise to strengthen your muscles (resistance exercise), such as weight lifting, as part of your weekly exercise routine. Try to do these types of exercises for 30 minutes at least 3 days a week. Do not use any products that contain nicotine or tobacco, such as cigarettes, e-cigarettes, and chewing tobacco. If you need help quitting, ask your health care provider. Control any long-term (chronic) conditions you have, such as high cholesterol or diabetes. Identify your sources of stress and find ways to manage stress. This may include meditation, deep breathing, or making time for fun activities. Alcohol use Do not drink alcohol if: Your health care  provider tells you not to drink. You are pregnant, may be pregnant, or are planning to become pregnant. If you drink alcohol: Limit how much you use to: 0-1 drink a day for women. 0-2 drinks a day for men. Be aware of how much alcohol is in your drink. In the [place] one drink equals one 12 oz bottle of beer (355 mL), one 5 oz glass of wine (148 mL), or one 1 oz glass of hard liquor (44 mL). Medicines Your health care provider may prescribe medicine if lifestyle changes are not enough to get your blood pressure under control and if: Your systolic blood pressure is 130 or higher. Your diastolic blood pressure is 80 or higher. Take medicines only as told by your health care provider. Follow the directions carefully. Blood pressure medicines must be taken as told  by your health care provider. The medicine does not work as well when you skip doses. Skipping doses also puts you at risk for problems. Monitoring Before you monitor your blood pressure: Do not smoke, drink caffeinated beverages, or exercise within 30 minutes before taking a measurement. Use the bathroom and empty your bladder (urinate). Sit quietly for at least 5 minutes before taking measurements. Monitor your blood pressure at home as told by your health care provider. To do this: Sit with your back straight and supported. Place your feet flat on the floor. Do not cross your legs. Support your arm on a flat surface, such as a table. Make sure your upper arm is at heart level. Each time you measure, take two or three readings one minute apart and record the results. You may also need to have your blood pressure checked regularly by your health care provider. General information Talk with your health care provider about your diet, exercise habits, and other lifestyle factors that may be contributing to hypertension. Review all the medicines you take with your health care provider because there may be side effects or  interactions. Keep all visits as told by your health care provider. Your health care provider can help you create and adjust your plan for managing your high blood pressure. Where to find more information National Heart, Lung, and Blood Institute: https://wilson-eaton.com/ American Heart Association: www.heart.org Contact a health care provider if: You think you are having a reaction to medicines you have taken. You have repeated (recurrent) headaches. You feel dizzy. You have swelling in your ankles. You have trouble with your vision. Get help right away if: You develop a severe headache or confusion. You have unusual weakness or numbness, or you feel faint. You have severe pain in your chest or abdomen. You vomit repeatedly. You have trouble breathing. These symptoms may represent a serious problem that is an emergency. Do not wait to see if the symptoms will go away. Get medical help right away. Call your local emergency services (911 in the [place] Do not drive yourself to the hospital. Summary Hypertension is when the force of blood pumping through your arteries is too strong. If this condition is not controlled, it may put you at risk for serious complications. Your personal target blood pressure may vary depending on your medical conditions, your age, and other factors. For most people, a normal blood pressure is less than 120/80. Hypertension is managed by lifestyle changes, medicines, or both. Lifestyle changes to help manage hypertension include losing weight, eating a healthy, low-sodium diet, exercising more, stopping smoking, and limiting alcohol. This information is not intended to replace advice given to you by your health care provider. Make sure you discuss any questions you have with your health care provider. Document Revised: 02/12/2019 Document Reviewed: 12/08/2018 Elsevier Patient Education  2022 [employer].

## 2020-10-04 NOTE — Progress Notes (Signed)
Subjective:  Patient ID: Andrea White, female    DOB: 01/10/1956  Age: 65 y.o. MRN: [id number]  Chief Complaint  Patient presents with   Diabetes   Hyperlipidemia   Hypertension    HPI  Pt is present for follow-up of hypertension. BP medications were adjusted 4-weeks ago at last visit.   Aila states she has multiple fire ant bites to bilateral ankles. States she was mowing grass with push-lawn mower two days ago when she realized both of her feet were covered with fire ants. States bites are pruritic and burning. She denies previous treatment.  Declines flu vaccine today. States she will return in October to obtain flu vaccine.  She was last seen for hypertension 4 weeks ago.  BP at that visit was 162/92. Management since that visit includes Valsartan 80 mg and Cardizem 90 mg BID.  She reports fair compliance with treatment.States she stopped Valsartan 80 mg two days ago She is not having side effects.  She is following a Low Sodium diet. She is not exercising. She does not smoke.  Use of agents associated with hypertension: NSAIDS.   Outside blood pressures are one-teens over seventies. Symptoms: No chest pain No chest pressure  No palpitations No syncope  No dyspnea No orthopnea  No paroxysmal nocturnal dyspnea No lower extremity edema   Pertinent labs: Lab Results  Component Value Date   CHOL 200 (H) 04/28/2020   HDL 44 04/28/2020   LDLCALC 123 (H) 04/28/2020   TRIG 186 (H) 04/28/2020   CHOLHDL 4.5 (H) 04/28/2020   Lab Results  Component Value Date   NA 136 (A) 09/12/2020   K 4.3 09/12/2020   CREATININE 1.3 (A) 09/12/2020   GFRNONAA 73 10/29/2019   GFRAA 84 10/29/2019   GLUCOSE 102 (H) 09/05/2020     The ASCVD Risk score (Arnett DK, et al., 2019) failed to calculate for the following reasons:   The patient has a prior MI or stroke diagnosis   Current Outpatient Medications on File Prior to Visit  Medication Sig Dispense Refill   ALPRAZolam (XANAX)  0.5 MG tablet TAKE 1/2 TO 1 TABLET BY MOUTH EVERY 8 HOURS AS NEEDED. 30 tablet 5   APPLE CIDER VINEGAR PO Take by mouth daily.     Biotin 10000 MCG TABS Take 1 tablet by mouth daily.     Blood Glucose Monitoring Suppl (TRUE METRIX METER) w/Device KIT USE AS DIRECTED 1 kit 0   diclofenac (VOLTAREN) 75 MG EC tablet Take 75 mg by mouth 2 (two) times daily.     diclofenac Sodium (VOLTAREN) 1 % GEL Apply topically 4 (four) times daily as needed.     diltiazem (CARDIZEM) 90 MG tablet Take 1 tablet (90 mg total) by mouth 2 (two) times daily. Take 1 tablet (90 mg total) by mouth 2 (two) times daily. 180 tablet 1   DULoxetine (CYMBALTA) 60 MG capsule TAKE 1 CAPSULE EVERY DAY 90 capsule 1   glucose blood (TRUE METRIX BLOOD GLUCOSE TEST) test strip USE AS DIRECTED TO CHECK FASTING BLOOD SUGAR 300 strip 2   loratadine (CLARITIN) 10 MG tablet Take 1 tablet (10 mg total) by mouth daily. 90 tablet 3   meclizine (ANTIVERT) 25 MG tablet TAKE 1 TABLET TWICE DAILY  AS  NEEDED  FOR  DIZZINESS. 180 tablet 1   metFORMIN (GLUCOPHAGE) 500 MG tablet TAKE 1 TABLET TWICE DAILY WITH MEALS 180 tablet 1   Multiple Vitamin (MULTIVITAMIN) tablet Take 1 tablet by mouth daily.  omeprazole (PRILOSEC) 40 MG capsule TAKE 1 CAPSULE (40 MG TOTAL) BY MOUTH 2 (TWO) TIMES DAILY. 180 capsule 1   oxybutynin (DITROPAN) 5 MG tablet TAKE 1 TABLET THREE TIMES DAILY 270 tablet 0   pravastatin (PRAVACHOL) 20 MG tablet Take 1 tablet (20 mg total) by mouth daily. 90 tablet 0   PROVENTIL HFA 108 (90 Base) MCG/ACT inhaler INHALE 2 PUFFS BY MOUTH FOUR TIMES A DAY AS NEEDED FOR WHEEZING 1 each 2   traMADol (ULTRAM) 50 MG tablet Take 1 tablet (50 mg total) by mouth 3 (three) times daily as needed. 90 tablet 1   TRUEplus Lancets 33G MISC E11.69 use new lancet each time when checking FBS 100 each 2   valsartan (DIOVAN) 80 MG tablet Take 1 tablet (80 mg total) by mouth daily. 90 tablet 3   No current facility-administered medications on file prior to  visit.   Past Medical History:  Diagnosis Date   Allergy    Anxiety    Arthritis    Bradycardia, unspecified    Bruises easily    Calf pain    when walking   Fibromyalgia    Frequent headaches    GERD (gastroesophageal reflux disease)    H/O bladder problems    Hypertension    IBS (irritable bowel syndrome)    Localized edema    Mild persistent asthma, uncomplicated    Morbid (severe) obesity due to excess calories (HCC)    Muscle pain    Myocardial infarction (HCC)    Other hypotension    Ovarian cyst    Slow transit constipation    Solitary pulmonary nodule    Swelling    feet and legs   Type 2 diabetes mellitus with diabetic nephropathy (HCC)    Varicose veins of bilateral lower extremities with pain    Past Surgical History:  Procedure Laterality Date   BUNIONECTOMY     left foot 5th toe   CARDIAC CATHETERIZATION     carpel tunnel surgery     CESAREAN SECTION     CHOLECYSTECTOMY     GALLBLADDER SURGERY     TONSILLECTOMY     TUBAL LIGATION     VAGINAL HYSTERECTOMY  2003   LAVH/SR    Family History  Problem Relation Age of Onset   Heart disease Father    Hypertension Father    Stroke Father    Heart attack Father    Cerebrovascular Accident Father    Diabetes Sister    Kidney disease Sister    Migraines Daughter    Breast cancer Maternal Aunt    Cancer Maternal Aunt 74       breast   Social History   Socioeconomic History   Marital status: Married    Spouse name: Not on file   Number of children: 1   Years of education: Not on file   Highest education level: Not on file  Occupational History   Occupation: Disabled due to foot problems since 1993  Tobacco Use   Smoking status: Never   Smokeless tobacco: Never  Vaping Use   Vaping Use: Never used  Substance and Sexual Activity   Alcohol use: No   Drug use: No   Sexual activity: Yes    Birth control/protection: Surgical    Comment: hyst  Other Topics Concern   Not on file  Social  History Narrative   Not on file   Social Determinants of Health   Financial Resource Strain: Not on file  Food Insecurity: No Food Insecurity   Worried About Charity fundraiser in the Last Year: Never true   Ran Out of Food in the Last Year: Never true  Transportation Needs: No Transportation Needs   Lack of Transportation (Medical): No   Lack of Transportation (Non-Medical): No  Physical Activity: Insufficiently Active   Days of Exercise per Week: 4 days   Minutes of Exercise per Session: 30 min  Stress: Not on file  Social Connections: Not on file    Review of Systems  Constitutional:  Negative for chills, fatigue and fever.  HENT:  Negative for congestion, ear pain, rhinorrhea and sore throat.   Eyes: Negative.   Respiratory:  Negative for cough and shortness of breath.   Cardiovascular:  Negative for chest pain.  Gastrointestinal:  Negative for abdominal pain, constipation, diarrhea, nausea and vomiting.       IBS  Endocrine: Negative.   Genitourinary:  Negative for dysuria and urgency.  Musculoskeletal:  Positive for arthralgias, back pain and myalgias.  Allergic/Immunologic: Positive for environmental allergies.  Neurological:  Negative for dizziness, weakness, light-headedness and headaches.  Psychiatric/Behavioral:  Negative for dysphoric mood. The patient is not nervous/anxious.     Objective:  BP 112/80   Pulse 75   Temp (!) 94.5 F (34.7 C)   Ht $R'5\' 2"'us$  (1.575 m)   Wt 194 lb (88 kg)   SpO2 95%   BMI 35.48 kg/m   BP/Weight 10/04/2020 09/12/2020 1/61/0960  Systolic BP 454 098 119  Diastolic BP 80 79 92  Wt. (Lbs) 194 193.3 193  BMI 35.48 36.52 36.47    Physical Exam Vitals reviewed.  Constitutional:      Appearance: Normal appearance.  HENT:     Head: Normocephalic.  Cardiovascular:     Rate and Rhythm: Normal rate and regular rhythm.     Pulses: Normal pulses.     Heart sounds: Normal heart sounds.  Pulmonary:     Effort: Pulmonary effort is  normal.     Breath sounds: Normal breath sounds.  Abdominal:     General: Bowel sounds are normal.     Palpations: Abdomen is soft.  Musculoskeletal:        General: Normal range of motion.     Cervical back: Neck supple.  Skin:    General: Skin is warm and dry.     Capillary Refill: Capillary refill takes less than 2 seconds.     Findings: Lesion and rash present. Rash is vesicular.          Comments: Multiple vesicular skin lesion to bilateral ankles with erythema   Neurological:     General: No focal deficit present.     Mental Status: She is alert and oriented to person, place, and time.  Psychiatric:        Mood and Affect: Mood normal.        Behavior: Behavior normal.        Lab Results  Component Value Date   WBC 14.2 09/12/2020   HGB 14.0 09/12/2020   HCT 42 09/12/2020   PLT 409 (A) 09/12/2020   GLUCOSE 102 (H) 09/05/2020   CHOL 200 (H) 04/28/2020   TRIG 186 (H) 04/28/2020   HDL 44 04/28/2020   LDLCALC 123 (H) 04/28/2020   ALT 18 09/12/2020   AST 27 09/12/2020   NA 136 (A) 09/12/2020   K 4.3 09/12/2020   CL 99 09/12/2020   CREATININE 1.3 (A) 09/12/2020   BUN 23 (A)  09/12/2020   CO2 29 (A) 09/12/2020   TSH 1.310 09/05/2020   HGBA1C 6.4 (H) 09/05/2020   MICROALBUR 80 09/05/2020      Assessment & Plan:   1. Essential hypertension-well controlled -continue Diltiazem 90 mg BID -continue Valsartan 80 mg daily -DASH diet -monitor BP at home -notify office of hypertension/hypotension  2. Fire ant bite, accidental or unintentional, initial encounter - triamcinolone acetonide (KENALOG-40) injection 60 mg    Kenalog steroid injection 60 mg given in office today Continue medications as prescribed Monitor BP at home Follow-up fasting 58-months or sooner if needed     Problem List Items Addressed This Visit       Cardiovascular and Mediastinum   Essential hypertension     Digestive   Gastroesophageal reflux disease     Endocrine   Diabetic  polyneuropathy associated with type 2 diabetes mellitus ([place]) - Primary     Other   Mixed hyperlipidemia  .    Follow-up: 68-months, fasting with Dr Tobie Poet; pt to return in Oct 2022 for flu vaccine  An After Visit Summary was printed and given to the patient.  Rip Harbour, NP [place] [phone]

## 2020-10-09 ENCOUNTER — Telehealth: Payer: Self-pay

## 2020-10-09 NOTE — Chronic Care Management (AMB) (Signed)
Chronic Care Management Pharmacy Assistant   Name: Andrea White  MRN: [id number] DOB: [birth year]  Reason for Encounter: Disease State/ Hypertension   Recent office visits:  10-04-2020 Andrea Morning, NP. Kenelog steroid injection 60 mg given.  Recent consult visits:  10-04-2020 Andrea White, DPM [facility] visits:  None in previous 6 months  Medications: Outpatient Encounter Medications as of 10/09/2020  Medication Sig   ALPRAZolam (XANAX) 0.5 MG tablet TAKE 1/2 TO 1 TABLET BY MOUTH EVERY 8 HOURS AS NEEDED.   APPLE CIDER VINEGAR PO Take by mouth daily.   Biotin 15991 MCG TABS Take 1 tablet by mouth daily.   Blood Glucose Monitoring Suppl (TRUE METRIX METER) w/Device KIT USE AS DIRECTED   diclofenac (VOLTAREN) 75 MG EC tablet Take 75 mg by mouth 2 (two) times daily.   diclofenac Sodium (VOLTAREN) 1 % GEL Apply topically 4 (four) times daily as needed.   diltiazem (CARDIZEM) 90 MG tablet Take 1 tablet (90 mg total) by mouth 2 (two) times daily. Take 1 tablet (90 mg total) by mouth 2 (two) times daily.   DULoxetine (CYMBALTA) 60 MG capsule TAKE 1 CAPSULE EVERY DAY   glucose blood (TRUE METRIX BLOOD GLUCOSE TEST) test strip USE AS DIRECTED TO CHECK FASTING BLOOD SUGAR   loratadine (CLARITIN) 10 MG tablet Take 1 tablet (10 mg total) by mouth daily.   meclizine (ANTIVERT) 25 MG tablet TAKE 1 TABLET TWICE DAILY  AS  NEEDED  FOR  DIZZINESS.   metFORMIN (GLUCOPHAGE) 500 MG tablet TAKE 1 TABLET TWICE DAILY WITH MEALS   Multiple Vitamin (MULTIVITAMIN) tablet Take 1 tablet by mouth daily.   omeprazole (PRILOSEC) 40 MG capsule TAKE 1 CAPSULE (40 MG TOTAL) BY MOUTH 2 (TWO) TIMES DAILY.   oxybutynin (DITROPAN) 5 MG tablet TAKE 1 TABLET THREE TIMES DAILY   pravastatin (PRAVACHOL) 20 MG tablet Take 1 tablet (20 mg total) by mouth daily.   PROVENTIL HFA 108 (90 Base) MCG/ACT inhaler INHALE 2 PUFFS BY MOUTH FOUR TIMES A DAY AS NEEDED FOR WHEEZING   traMADol (ULTRAM) 50 MG  tablet Take 1 tablet (50 mg total) by mouth 3 (three) times daily as needed.   TRUEplus Lancets 33G MISC E11.69 use new lancet each time when checking FBS   valsartan (DIOVAN) 80 MG tablet Take 1 tablet (80 mg total) by mouth daily.   No facility-administered encounter medications on file as of 10/09/2020.    Recent Office Vitals: BP Readings from Last 3 Encounters:  10/04/20 112/80  09/12/20 138/79  09/05/20 (!) 162/92   Pulse Readings from Last 3 Encounters:  10/04/20 75  09/12/20 83  09/05/20 78    Wt Readings from Last 3 Encounters:  10/04/20 194 lb (88 kg)  09/12/20 193 lb 4.8 oz (87.7 kg)  09/05/20 193 lb (87.5 kg)     Kidney Function Lab Results  Component Value Date/Time   CREATININE 1.3 (A) 09/12/2020 12:00 AM   CREATININE 0.99 09/05/2020 02:48 PM   CREATININE 0.76 04/28/2020 08:47 AM   GFRNONAA 73 10/29/2019 08:50 AM   GFRAA 84 10/29/2019 08:50 AM    BMP Latest Ref Rng & Units 09/12/2020 09/05/2020 04/28/2020  Glucose 65 - 99 mg/dL - 484(W) 032(V)  BUN 4 - 21 23(A) 22 10  Creatinine 0.5 - 1.1 1.3(A) 0.99 0.76  BUN/Creat Ratio 12 - 28 - 22 13  Sodium 137 - 147 136(A) 136 138  Potassium 3.4 - 5.3 4.3 4.1 3.9  Chloride 99 -  108 99 94(L) 94(L)  CO2 13 - 22 29(A) 25 27  Calcium 8.7 - 10.7 8.9 10.5(H) 9.9     Current antihypertensive regimen:  Spironolactone-hydrochlorothiazide 25-25 mg daily Diltiazem 90 mg twice daily  Patient verbally confirms she is taking the above medications as directed. Yes  How often are you checking your Blood Pressure? daily  she checks her blood pressure in the [facility] before taking her medication.   DATE:             BP               PULSE 10-08-2020  111/62  74 10-09-2020  120/83  81   Wrist or arm cuff: cuff Caffeine intake: Patient states about 20 ounces of  Salt intake: limited kosher salt OTC medications including pseudoephedrine or NSAIDs? None  Any readings above 180/120? No  What recent interventions/DTPs have  been made by any provider to improve Blood Pressure control since last CPP Visit:  Educated on BP goals and benefits of medications for prevention of heart attack, stroke and kidney damage Daily salt intake goal < 2300 mg Exercise goal of 150 minutes per week  Any recent hospitalizations or ED visits since last visit with CPP? No  What diet changes have been made to improve Blood Pressure Control?  Patient states she uses kosher salt eat vegetables/fruits. Patient states she is not a water drinker.  What exercise is being done to improve your Blood Pressure Control?  Patient states she does yard work and walks dogs daily.  Adherence Review: Is the patient currently on ACE/ARB medication? Yes Does the patient have >5 day gap between last estimated fill dates? CPP to review  NOTES: Patient states she discontinued Valsartan because it dropped her blood pressure.   Care Gaps: Last annual wellness visit? 05-30-2020 Yearly foot exam= 10-04-2020 Hep C screening overdue Shingrix overdue PAP overdue Colonoscopy overdue Pneumococcal overdue  Star Rating Drugs:  Medication:  Last Fill: Day Supply Valsartan 80 mg 09-05-2020     90 Pravastatin 20 mg       08-09-2020 90 Metformin 500 mg 09-21-2020 [place] Clinical Pharmacist Assistant [phone]

## 2020-11-06 ENCOUNTER — Other Ambulatory Visit: Payer: Self-pay | Admitting: Family Medicine

## 2020-11-06 NOTE — Telephone Encounter (Signed)
Refill sent to pharmacy.   

## 2020-11-26 ENCOUNTER — Other Ambulatory Visit: Payer: Self-pay | Admitting: Physician Assistant

## 2020-12-11 ENCOUNTER — Other Ambulatory Visit: Payer: Self-pay

## 2020-12-11 MED ORDER — ALPRAZOLAM 0.5 MG PO TABS: ORAL_TABLET | ORAL | 5 refills | Status: DC

## 2020-12-12 ENCOUNTER — Ambulatory Visit (INDEPENDENT_AMBULATORY_CARE_PROVIDER_SITE_OTHER): Payer: Medicare HMO

## 2020-12-12 ENCOUNTER — Ambulatory Visit: Payer: Medicare HMO

## 2020-12-12 DIAGNOSIS — Z23 Encounter for immunization: Secondary | ICD-10-CM

## 2020-12-28 ENCOUNTER — Telehealth: Payer: Self-pay

## 2020-12-28 NOTE — Chronic Care Management (AMB) (Signed)
Chronic Care Management Pharmacy Assistant   Name: Andrea White  MRN: [id number] DOB: 03-Nov-1955  Reason for Encounter: Disease State/ Cholesterol  Recent office visits:  12-12-2020 [id number]. Flu vaccine given.  Recent consult visits:  None  Hospital visits:  None in previous 6 months  Medications: Outpatient Encounter Medications as of 12/28/2020  Medication Sig   ALPRAZolam (XANAX) 0.5 MG tablet TAKE 1/2 TO 1 TABLET BY MOUTH EVERY 8 HOURS AS NEEDED.   APPLE CIDER VINEGAR PO Take by mouth daily.   Biotin 87029 MCG TABS Take 1 tablet by mouth daily.   Blood Glucose Monitoring Suppl (TRUE METRIX METER) w/Device KIT USE AS DIRECTED   diclofenac (VOLTAREN) 75 MG EC tablet Take 75 mg by mouth 2 (two) times daily.   diclofenac Sodium (VOLTAREN) 1 % GEL Apply topically 4 (four) times daily as needed.   diltiazem (CARDIZEM) 90 MG tablet Take 1 tablet (90 mg total) by mouth 2 (two) times daily. Take 1 tablet (90 mg total) by mouth 2 (two) times daily.   DULoxetine (CYMBALTA) 60 MG capsule TAKE 1 CAPSULE EVERY DAY   glucose blood (TRUE METRIX BLOOD GLUCOSE TEST) test strip USE AS DIRECTED TO CHECK FASTING BLOOD SUGAR   loratadine (CLARITIN) 10 MG tablet Take 1 tablet (10 mg total) by mouth daily.   meclizine (ANTIVERT) 25 MG tablet TAKE 1 TABLET TWICE DAILY  AS  NEEDED  FOR  DIZZINESS.   metFORMIN (GLUCOPHAGE) 500 MG tablet TAKE 1 TABLET TWICE DAILY WITH MEALS   Multiple Vitamin (MULTIVITAMIN) tablet Take 1 tablet by mouth daily.   omeprazole (PRILOSEC) 40 MG capsule TAKE 1 CAPSULE TWICE DAILY   oxybutynin (DITROPAN) 5 MG tablet TAKE 1 TABLET THREE TIMES DAILY   pravastatin (PRAVACHOL) 20 MG tablet TAKE 1 TABLET (20 MG TOTAL) BY MOUTH DAILY.   PROVENTIL HFA 108 (90 Base) MCG/ACT inhaler INHALE 2 PUFFS BY MOUTH FOUR TIMES A DAY AS NEEDED FOR WHEEZING   traMADol (ULTRAM) 50 MG tablet Take 1 tablet (50 mg total) by mouth 3 (three) times daily as needed.   TRUEplus Lancets 33G MISC  E11.69 use new lancet each time when checking FBS   valsartan (DIOVAN) 80 MG tablet Take 1 tablet (80 mg total) by mouth daily.   No facility-administered encounter medications on file as of 12/28/2020.   Lipid Panel    Component Value Date/Time   CHOL 200 (H) 04/28/2020 0847   TRIG 186 (H) 04/28/2020 0847   HDL 44 04/28/2020 0847   LDLCALC 123 (H) 04/28/2020 0847    10-year ASCVD risk score: The ASCVD Risk score (Arnett DK, et al., 2019) failed to calculate for the following reasons:   The patient has a prior MI or stroke diagnosis  Current antihyperlipidemic regimen:  Pravastatin 20 mg daily   Previous antihyperlipidemic medications tried: Lipitor  ASCVD risk enhancing conditions: HTN  What recent interventions/DTPs have been made by any provider to improve Cholesterol control since last CPP Visit:  Educated on Cholesterol goals;  Benefits of statin for ASCVD risk reduction; Importance of limiting foods high in cholesterol; -Counseled on diet and exercise extensively Recommended to continue current medication  Any recent hospitalizations or ED visits since last visit with CPP? No  What diet changes have been made to improve Cholesterol?  Patient states she has limited her fried food intake.   What exercise is being done to improve Cholesterol?  Patient states she does yard work and walks dogs daily.  Adherence Review: Does  the patient have >5 day gap between last estimated fill dates? Yes  Care Gaps: Last annual wellness visit? 05-30-2020 Yearly foot exam: 10-04-2020 Hep C screening overdue Shingrix overdue PAP overdue Colonoscopy overdue Pneumococcal overdue  Star Rating Drugs: Metformin 500 mg- Last filled 09-22-2020 90 DS (Called [facility] last filled 12-05-2020 90 DS) Pravastatin 20 mg- Last filled 11-08-2020 90 DS  [place] Clinical Pharmacist Assistant [phone]

## 2021-01-03 ENCOUNTER — Ambulatory Visit: Payer: Medicare HMO | Admitting: Sports Medicine

## 2021-01-03 ENCOUNTER — Encounter: Payer: Self-pay | Admitting: Sports Medicine

## 2021-01-03 DIAGNOSIS — M2141 Flat foot [pes planus] (acquired), right foot: Secondary | ICD-10-CM

## 2021-01-03 DIAGNOSIS — M79675 Pain in left toe(s): Secondary | ICD-10-CM

## 2021-01-03 DIAGNOSIS — B351 Tinea unguium: Secondary | ICD-10-CM | POA: Diagnosis not present

## 2021-01-03 DIAGNOSIS — M2142 Flat foot [pes planus] (acquired), left foot: Secondary | ICD-10-CM

## 2021-01-03 DIAGNOSIS — E1142 Type 2 diabetes mellitus with diabetic polyneuropathy: Secondary | ICD-10-CM

## 2021-01-03 DIAGNOSIS — M21619 Bunion of unspecified foot: Secondary | ICD-10-CM

## 2021-01-03 DIAGNOSIS — M2041 Other hammer toe(s) (acquired), right foot: Secondary | ICD-10-CM

## 2021-01-03 DIAGNOSIS — M2042 Other hammer toe(s) (acquired), left foot: Secondary | ICD-10-CM

## 2021-01-03 DIAGNOSIS — M79674 Pain in right toe(s): Secondary | ICD-10-CM

## 2021-01-03 NOTE — Progress Notes (Signed)
Subjective: Andrea White is a 65 y.o. female patient who returns to office for diabetic nail care.  Patient reports that she is doing good. Requests new cushions. No other acute issues noted.  Last in person visit was 3 months ago however has had telephone visits a few times for medication management  Last A1c 6.4  Patient Active Problem List   Diagnosis Date Noted   Hx of adenomatous colonic polyps 09/12/2020    Class: History of   Diabetic polyneuropathy associated with type 2 diabetes mellitus ([place]) 04/30/2019   Essential hypertension 04/30/2019   Mixed hyperlipidemia 04/30/2019   Mixed diabetic hyperlipidemia associated with type 2 diabetes mellitus ([place]) 04/30/2019   Major depressive disorder, single episode, mild ([place]) 04/30/2019   Leukocytosis 04/17/2018    Class: Minor   Thrombocytosis, unspecified 04/17/2018    Class: Diagnosis of   Myocardial infarction ([place]) 10/31/2017   Primary fibromyalgia syndrome 10/31/2017   Gastroesophageal reflux disease 03/15/2015   Asthma 03/15/2015   Obesity 03/15/2015   Herniated disc 06/10/2011   History of heart attack 06/10/2011   Cyst of ovary     Current Outpatient Medications on File Prior to Visit  Medication Sig Dispense Refill   ALPRAZolam (XANAX) 0.5 MG tablet TAKE 1/2 TO 1 TABLET BY MOUTH EVERY 8 HOURS AS NEEDED. 30 tablet 5   APPLE CIDER VINEGAR PO Take by mouth daily.     Biotin 10000 MCG TABS Take 1 tablet by mouth daily.     Blood Glucose Monitoring Suppl (TRUE METRIX METER) w/Device KIT USE AS DIRECTED 1 kit 0   diclofenac (VOLTAREN) 75 MG EC tablet Take 75 mg by mouth 2 (two) times daily.     diclofenac Sodium (VOLTAREN) 1 % GEL Apply topically 4 (four) times daily as needed.     diltiazem (CARDIZEM) 90 MG tablet Take 1 tablet (90 mg total) by mouth 2 (two) times daily. Take 1 tablet (90 mg total) by mouth 2 (two) times daily. 180 tablet 1   DULoxetine (CYMBALTA) 60 MG capsule TAKE 1 CAPSULE EVERY DAY 90 capsule 1    glucose blood (TRUE METRIX BLOOD GLUCOSE TEST) test strip USE AS DIRECTED TO CHECK FASTING BLOOD SUGAR 300 strip 2   loratadine (CLARITIN) 10 MG tablet Take 1 tablet (10 mg total) by mouth daily. 90 tablet 3   meclizine (ANTIVERT) 25 MG tablet TAKE 1 TABLET TWICE DAILY  AS  NEEDED  FOR  DIZZINESS. 180 tablet 1   metFORMIN (GLUCOPHAGE) 500 MG tablet TAKE 1 TABLET TWICE DAILY WITH MEALS 180 tablet 1   Multiple Vitamin (MULTIVITAMIN) tablet Take 1 tablet by mouth daily.     omeprazole (PRILOSEC) 40 MG capsule TAKE 1 CAPSULE TWICE DAILY 180 capsule 1   oxybutynin (DITROPAN) 5 MG tablet TAKE 1 TABLET THREE TIMES DAILY 270 tablet 0   pravastatin (PRAVACHOL) 20 MG tablet TAKE 1 TABLET (20 MG TOTAL) BY MOUTH DAILY. 90 tablet 0   PROVENTIL HFA 108 (90 Base) MCG/ACT inhaler INHALE 2 PUFFS BY MOUTH FOUR TIMES A DAY AS NEEDED FOR WHEEZING 1 each 2   spironolactone-hydrochlorothiazide (ALDACTAZIDE) 25-25 MG tablet      traMADol (ULTRAM) 50 MG tablet Take 1 tablet (50 mg total) by mouth 3 (three) times daily as needed. 90 tablet 1   TRUEplus Lancets 33G MISC E11.69 use new lancet each time when checking FBS 100 each 2   valsartan (DIOVAN) 80 MG tablet Take 1 tablet (80 mg total) by mouth daily. 90 tablet 3  No current facility-administered medications on file prior to visit.    Allergies  Allergen Reactions   Morphine And Related Other (See Comments)    Woozy, "out of body experience"   Celebrex [Celecoxib] Other (See Comments)    Patient does not remember intolerance   Gabapentin Other (See Comments)    Confusion   Lisinopril Cough   Guaifenesin & Derivatives Other (See Comments)    Patient cannot remember specific issue, just made her feel "weird"   Lipitor [Atorvastatin] Rash   Sulfa Antibiotics Rash   Valium [Diazepam] Other (See Comments)    Lightheaded, dizziness     Objective:  General: Alert and oriented x3 in no acute distress  Dermatology: Nails x10 elongated thickened dystrophic  consistent with onychomycosis.    Vascular: Dorsalis Pedis and Posterior Tibial pedal pulses palpable, Capillary Fill Time 5 seconds,(+) pedal hair growth bilateral, no edema bilateral lower extremities, Temperature gradient within normal limits.  Neurology: Johney Maine sensation intact via light touch bilateral.  Musculoskeletal: + pes planus foot type, bunion and hammertoe deformity that is currently asymptomatic.   Assessment and Plan: Problem List Items Addressed This Visit       Endocrine   Diabetic polyneuropathy associated with type 2 diabetes mellitus ([place])   Other Visit Diagnoses     Pain due to onychomycosis of toenails of both feet    -  Primary   Pes planus of both feet       Bunion       Hammer toes of both feet           -Complete examination performed -Mechanically debrided nails x10 using a sterile nipper without incident -Continue with spacer as tolerated -Dispensed new toe cap and spacer  -Continue with good supportive diabetic shoe -Patient to return to office for in 3 months for nail care or sooner if condition worsens.    Landis Martins, DPM

## 2021-03-22 ENCOUNTER — Other Ambulatory Visit: Payer: Self-pay | Admitting: Family Medicine

## 2021-03-22 ENCOUNTER — Other Ambulatory Visit: Payer: Self-pay | Admitting: Physician Assistant

## 2021-03-27 ENCOUNTER — Telehealth: Payer: Self-pay

## 2021-03-27 NOTE — Chronic Care Management (AMB) (Signed)
? ? ?Chronic Care Management ?Pharmacy Assistant  ? ?Name: Andrea White  MRN: [id number] DOB: [birth year] ? ? ?Reason for Encounter: Disease State call for DM  ?  ?Recent office visits:  ?None ? ?Recent consult visits:  ?01/03/21 (Podiatry) Cannon Kettle, Titorya DPM. Seen for Nail Problem. No med changes.  ? ?Hospital visits:  ?None ? ?Medications: ?Outpatient Encounter Medications as of 03/27/2021  ?Medication Sig  ? ALPRAZolam (XANAX) 0.5 MG tablet TAKE 1/2 TO 1 TABLET BY MOUTH EVERY 8 HOURS AS NEEDED.  ? APPLE CIDER VINEGAR PO Take by mouth daily.  ? Biotin 10000 MCG TABS Take 1 tablet by mouth daily.  ? Blood Glucose Monitoring Suppl (TRUE METRIX METER) w/Device KIT USE AS DIRECTED  ? diclofenac (VOLTAREN) 75 MG EC tablet Take 75 mg by mouth 2 (two) times daily.  ? diclofenac Sodium (VOLTAREN) 1 % GEL Apply topically 4 (four) times daily as needed.  ? diltiazem (CARDIZEM) 90 MG tablet Take 1 tablet (90 mg total) by mouth 2 (two) times daily. Take 1 tablet (90 mg total) by mouth 2 (two) times daily.  ? DULoxetine (CYMBALTA) 60 MG capsule TAKE 1 CAPSULE EVERY DAY  ? loratadine (CLARITIN) 10 MG tablet Take 1 tablet (10 mg total) by mouth daily.  ? meclizine (ANTIVERT) 25 MG tablet TAKE 1 TABLET TWICE DAILY  AS  NEEDED  FOR  DIZZINESS.  ? metFORMIN (GLUCOPHAGE) 500 MG tablet TAKE 1 TABLET TWICE DAILY WITH MEALS  ? Multiple Vitamin (MULTIVITAMIN) tablet Take 1 tablet by mouth daily.  ? omeprazole (PRILOSEC) 40 MG capsule TAKE 1 CAPSULE TWICE DAILY  ? oxybutynin (DITROPAN) 5 MG tablet TAKE 1 TABLET THREE TIMES DAILY  ? pravastatin (PRAVACHOL) 20 MG tablet TAKE 1 TABLET EVERY DAY  ? PROVENTIL HFA 108 (90 Base) MCG/ACT inhaler INHALE 2 PUFFS BY MOUTH FOUR TIMES A DAY AS NEEDED FOR WHEEZING  ? spironolactone-hydrochlorothiazide (ALDACTAZIDE) 25-25 MG tablet TAKE 1 TABLET EVERY DAY  ? traMADol (ULTRAM) 50 MG tablet Take 1 tablet (50 mg total) by mouth 3 (three) times daily as needed.  ? TRUE METRIX BLOOD GLUCOSE TEST test strip  USE AS DIRECTED TO CHECK FASTING BLOOD SUGAR.  ? TRUEplus Lancets 33G MISC E11.69 use new lancet each time when checking FBS  ? valsartan (DIOVAN) 80 MG tablet Take 1 tablet (80 mg total) by mouth daily.  ? ?No facility-administered encounter medications on file as of 03/27/2021.  ? ? ?Recent Relevant Labs: ?Lab Results  ?Component Value Date/Time  ? HGBA1C 6.4 (H) 09/05/2020 02:48 PM  ? HGBA1C 6.6 (H) 04/28/2020 08:47 AM  ? MICROALBUR 80 09/05/2020 04:42 PM  ? MICROALBUR 30 04/30/2019 12:32 PM  ?  ?Kidney Function ?Lab Results  ?Component Value Date/Time  ? CREATININE 1.3 (A) 09/12/2020 12:00 AM  ? CREATININE 0.99 09/05/2020 02:48 PM  ? CREATININE 0.76 04/28/2020 08:47 AM  ? GFRNONAA 73 10/29/2019 08:50 AM  ? GFRAA 84 10/29/2019 08:50 AM  ? ? ? ?Current antihyperglycemic regimen:  ?Metformin 543m Take 1 tablet twice daily.  ?Patient verbally confirms she is taking the above medications as directed. Yes ? ?What recent interventions/DTPs have been made to improve glycemic control:  ?Pt denies any changes  ? ?Have there been any recent hospitalizations or ED visits since last visit with CPP? No ? ?Patient denies hypoglycemic symptoms, including None ? ?Patient denies hyperglycemic symptoms, including none ? ?How often are you checking your blood sugar? Pt does not check often but checks when she feels something feels  off  ? ?What are your blood sugars ranging?  ?Fasting: 03/29/21 109, 03/28/21 126, 03/27/21 89 ? ?On insulin? No ? ?During the week, how often does your blood glucose drop below 70? Never ? ?Are you checking your feet daily/regularly? Yes ? ?Adherence Review: ?Is the patient currently on a STATIN medication? Yes ?Is the patient currently on ACE/ARB medication? Yes ?Does the patient have >5 day gap between last estimated fill dates? CPP to review ? ?Care Gaps: ?Last eye exam / Retinopathy Screening? 08/31/20 ?Last Annual Wellness Visit? 05/30/20 ?Last Diabetic Foot Exam? 12/10/18 ? ? ?Star Rating Drugs:   ?Medication:  Last Fill: Day Supply ?Metformin   02/14/21 90ds ?   12/04/20 90ds ? ? ?Elray Mcgregor, CMA ?Clinical Pharmacist Assistant  ?[phone]  ?

## 2021-04-03 ENCOUNTER — Other Ambulatory Visit: Payer: Self-pay

## 2021-04-03 ENCOUNTER — Ambulatory Visit (INDEPENDENT_AMBULATORY_CARE_PROVIDER_SITE_OTHER): Payer: Medicare HMO | Admitting: Family Medicine

## 2021-04-03 ENCOUNTER — Encounter: Payer: Self-pay | Admitting: Family Medicine

## 2021-04-03 ENCOUNTER — Encounter: Payer: Self-pay | Admitting: Sports Medicine

## 2021-04-03 ENCOUNTER — Ambulatory Visit (INDEPENDENT_AMBULATORY_CARE_PROVIDER_SITE_OTHER): Payer: Medicare HMO | Admitting: Sports Medicine

## 2021-04-03 VITALS — BP 108/78 | HR 88 | Temp 97.1°F | Resp 18 | Ht 62.0 in | Wt 200.0 lb

## 2021-04-03 DIAGNOSIS — E782 Mixed hyperlipidemia: Secondary | ICD-10-CM | POA: Diagnosis not present

## 2021-04-03 DIAGNOSIS — J3089 Other allergic rhinitis: Secondary | ICD-10-CM | POA: Diagnosis not present

## 2021-04-03 DIAGNOSIS — E1142 Type 2 diabetes mellitus with diabetic polyneuropathy: Secondary | ICD-10-CM

## 2021-04-03 DIAGNOSIS — K219 Gastro-esophageal reflux disease without esophagitis: Secondary | ICD-10-CM

## 2021-04-03 DIAGNOSIS — I152 Hypertension secondary to endocrine disorders: Secondary | ICD-10-CM

## 2021-04-03 DIAGNOSIS — M2042 Other hammer toe(s) (acquired), left foot: Secondary | ICD-10-CM

## 2021-04-03 DIAGNOSIS — M79674 Pain in right toe(s): Secondary | ICD-10-CM | POA: Diagnosis not present

## 2021-04-03 DIAGNOSIS — M79675 Pain in left toe(s): Secondary | ICD-10-CM | POA: Diagnosis not present

## 2021-04-03 DIAGNOSIS — I10 Essential (primary) hypertension: Secondary | ICD-10-CM

## 2021-04-03 DIAGNOSIS — E1159 Type 2 diabetes mellitus with other circulatory complications: Secondary | ICD-10-CM

## 2021-04-03 DIAGNOSIS — M797 Fibromyalgia: Secondary | ICD-10-CM | POA: Diagnosis not present

## 2021-04-03 DIAGNOSIS — M19072 Primary osteoarthritis, left ankle and foot: Secondary | ICD-10-CM

## 2021-04-03 DIAGNOSIS — Z1211 Encounter for screening for malignant neoplasm of colon: Secondary | ICD-10-CM | POA: Diagnosis not present

## 2021-04-03 DIAGNOSIS — B351 Tinea unguium: Secondary | ICD-10-CM | POA: Diagnosis not present

## 2021-04-03 DIAGNOSIS — M19071 Primary osteoarthritis, right ankle and foot: Secondary | ICD-10-CM

## 2021-04-03 DIAGNOSIS — M2142 Flat foot [pes planus] (acquired), left foot: Secondary | ICD-10-CM

## 2021-04-03 DIAGNOSIS — M2141 Flat foot [pes planus] (acquired), right foot: Secondary | ICD-10-CM

## 2021-04-03 DIAGNOSIS — M21619 Bunion of unspecified foot: Secondary | ICD-10-CM

## 2021-04-03 DIAGNOSIS — J4520 Mild intermittent asthma, uncomplicated: Secondary | ICD-10-CM

## 2021-04-03 DIAGNOSIS — M2041 Other hammer toe(s) (acquired), right foot: Secondary | ICD-10-CM

## 2021-04-03 MED ORDER — LORATADINE 10 MG PO TABS: 10.0000 mg | ORAL_TABLET | Freq: Every day | ORAL | 3 refills | Status: DC

## 2021-04-03 MED ORDER — METFORMIN HCL 500 MG PO TABS
500.0000 mg | ORAL_TABLET | Freq: Two times a day (BID) | ORAL | 1 refills | Status: DC
Start: 2021-04-03 — End: 2021-05-24

## 2021-04-03 MED ORDER — DICLOFENAC SODIUM 1 % EX GEL
4.0000 g | Freq: Four times a day (QID) | CUTANEOUS | 3 refills | Status: DC | PRN
Start: 2021-04-03 — End: 2023-12-16

## 2021-04-03 MED ORDER — VALSARTAN 80 MG PO TABS: 80.0000 mg | ORAL_TABLET | Freq: Every day | ORAL | 3 refills | Status: DC

## 2021-04-03 MED ORDER — OMEPRAZOLE 40 MG PO CPDR: 40.0000 mg | DELAYED_RELEASE_CAPSULE | Freq: Two times a day (BID) | ORAL | 1 refills | Status: DC

## 2021-04-03 MED ORDER — ALPRAZOLAM 0.5 MG PO TABS: ORAL_TABLET | ORAL | 5 refills | Status: DC

## 2021-04-03 MED ORDER — DULOXETINE HCL 60 MG PO CPEP: 60.0000 mg | ORAL_CAPSULE | Freq: Every day | ORAL | 1 refills | Status: DC

## 2021-04-03 MED ORDER — PRAVASTATIN SODIUM 20 MG PO TABS: 20.0000 mg | ORAL_TABLET | Freq: Every day | ORAL | 0 refills | Status: DC

## 2021-04-03 MED ORDER — OXYBUTYNIN CHLORIDE 5 MG PO TABS: 5.0000 mg | ORAL_TABLET | Freq: Three times a day (TID) | ORAL | 0 refills | Status: DC

## 2021-04-03 MED ORDER — PROVENTIL HFA 108 (90 BASE) MCG/ACT IN AERS: INHALATION_SPRAY | RESPIRATORY_TRACT | 2 refills | Status: DC

## 2021-04-03 MED ORDER — MECLIZINE HCL 25 MG PO TABS: 25.0000 mg | ORAL_TABLET | Freq: Two times a day (BID) | ORAL | 1 refills | Status: DC

## 2021-04-03 MED ORDER — DICLOFENAC SODIUM 75 MG PO TBEC: 75.0000 mg | DELAYED_RELEASE_TABLET | Freq: Two times a day (BID) | ORAL | 1 refills | Status: DC

## 2021-04-03 MED ORDER — SPIRONOLACTONE-HCTZ 25-25 MG PO TABS: 1.0000 | ORAL_TABLET | Freq: Every day | ORAL | 0 refills | Status: DC

## 2021-04-03 NOTE — Assessment & Plan Note (Signed)
The current medical regimen is effective;  continue present plan and medications. ?Uses albuterol as needed only.  ?

## 2021-04-03 NOTE — Progress Notes (Signed)
? ?Subjective:  ?Patient ID: Andrea White, female    DOB: 1955-08-05  Age: 66 y.o. MRN: [id number] ? ?Chief Complaint  ?Patient presents with  ? Diabetes  ? Hypertension  ? ? ?HPI ?Diabetes:  ?Complications: neuropathy. ?Glucose checking: daily  ?Glucose logs: Range 96-126. ?Hypoglycemia: none ?Most recent A1C: 6.4 ?Current medications: Metformin 500 mg twice daily  ?Foot checks: daily. ? ?Hyperlipidemia: ?Current medications: Pravastatin 20 mg daily.  ? ?Hypertension: ?Current medications: Aldactazide 25-25 daily, Valsartan 80 mg daily, patient took herself off diltiazem due to low sbp 100.  ? ?GERD: on omeprazole 40 mg one oral twice daily.  ?Urge incontinence: oxybutynin 5 mg three times a day.  ?FM: on duloxetine 60 mg before bed. Takes tramadol about once daily. Taking diclofenac.  ?Allergic rhinitis: claritin. ? ?Diet: eats fairly healthy.  ?Exercise:  20 toe touches daily. Gardening.  ? ?Current Outpatient Medications on File Prior to Visit  ?Medication Sig Dispense Refill  ? APPLE CIDER VINEGAR PO Take by mouth daily.    ? Biotin 10000 MCG TABS Take 1 tablet by mouth daily.    ? Blood Glucose Monitoring Suppl (TRUE METRIX METER) w/Device KIT USE AS DIRECTED 1 kit 0  ? Multiple Vitamin (MULTIVITAMIN) tablet Take 1 tablet by mouth daily.    ? traMADol (ULTRAM) 50 MG tablet Take 1 tablet (50 mg total) by mouth 3 (three) times daily as needed. 90 tablet 1  ? TRUE METRIX BLOOD GLUCOSE TEST test strip USE AS DIRECTED TO CHECK FASTING BLOOD SUGAR. 300 strip 2  ? TRUEplus Lancets 33G MISC E11.69 use new lancet each time when checking FBS 100 each 2  ? diltiazem (CARDIZEM) 90 MG tablet Take 1 tablet (90 mg total) by mouth 2 (two) times daily. Take 1 tablet (90 mg total) by mouth 2 (two) times daily. (Patient not taking: Reported on 04/03/2021) 180 tablet 1  ? ?No current facility-administered medications on file prior to visit.  ? ?Past Medical History:  ?Diagnosis Date  ? Allergy   ? Anxiety   ? Arthritis   ?  Bradycardia, unspecified   ? Bruises easily   ? Calf pain   ? when walking  ? Fibromyalgia   ? Frequent headaches   ? GERD (gastroesophageal reflux disease)   ? H/O bladder problems   ? Hypertension   ? IBS (irritable bowel syndrome)   ? Localized edema   ? Mild persistent asthma, uncomplicated   ? Morbid (severe) obesity due to excess calories ([place])   ? Muscle pain   ? Myocardial infarction [facility])   ? Other hypotension   ? Ovarian cyst   ? Slow transit constipation   ? Solitary pulmonary nodule   ? Swelling   ? feet and legs  ? Type 2 diabetes mellitus with diabetic nephropathy ([place])   ? Varicose veins of bilateral lower extremities with pain   ? ?Past Surgical History:  ?Procedure Laterality Date  ? BUNIONECTOMY    ? left foot 5th toe  ? CARDIAC CATHETERIZATION    ? carpel tunnel surgery    ? CESAREAN SECTION    ? CHOLECYSTECTOMY    ? GALLBLADDER SURGERY    ? TONSILLECTOMY    ? TUBAL LIGATION    ? VAGINAL HYSTERECTOMY  2003  ? LAVH/SR  ?  ?Family History  ?Problem Relation Age of Onset  ? Heart disease Father   ? Hypertension Father   ? Stroke Father   ? Heart attack Father   ? Cerebrovascular  Accident Father   ? Diabetes Sister   ? Kidney disease Sister   ? Migraines Daughter   ? Breast cancer Maternal Aunt   ? Cancer Maternal Aunt 74  ?     breast  ? ?Social History  ? ?Socioeconomic History  ? Marital status: Married  ?  Spouse name: Not on file  ? Number of children: 1  ? Years of education: Not on file  ? Highest education level: Not on file  ?Occupational History  ? Occupation: Disabled due to foot problems since 1993  ?Tobacco Use  ? Smoking status: Never  ? Smokeless tobacco: Never  ?Vaping Use  ? Vaping Use: Never used  ?Substance and Sexual Activity  ? Alcohol use: No  ? Drug use: No  ? Sexual activity: Yes  ?  Birth control/protection: Surgical  ?  Comment: hyst  ?Other Topics Concern  ? Not on file  ?Social History Narrative  ? Not on file  ? ?Social Determinants of Health  ? ?Financial Resource Strain:  Not on file  ?Food Insecurity: No Food Insecurity  ? Worried About Charity fundraiser in the Last Year: Never true  ? Ran Out of Food in the Last Year: Never true  ?Transportation Needs: No Transportation Needs  ? Lack of Transportation (Medical): No  ? Lack of Transportation (Non-Medical): No  ?Physical Activity: Insufficiently Active  ? Days of Exercise per Week: 4 days  ? Minutes of Exercise per Session: 30 min  ?Stress: Not on file  ?Social Connections: Not on file  ? ? ?Review of Systems  ?Constitutional:  Negative for chills, fatigue and fever.  ?HENT:  Negative for congestion, rhinorrhea and sore throat.   ?Respiratory:  Positive for cough. Negative for shortness of breath.   ?Cardiovascular:  Negative for chest pain and palpitations.  ?Gastrointestinal:  Positive for abdominal pain (left-sided pain). Negative for constipation, diarrhea, nausea and vomiting.  ?Genitourinary:  Negative for dysuria and urgency.  ?Musculoskeletal:  Positive for arthralgias (shoulder, wrist. knees and feet), back pain and neck pain. Negative for myalgias.  ?Neurological:  Negative for dizziness (controlled w/antivert), weakness, light-headedness and headaches.  ?Psychiatric/Behavioral:  Negative for dysphoric mood. The patient is not nervous/anxious.   ? ? ?Objective:  ?BP 108/78   Pulse 88   Temp (!) 97.1 ?F (36.2 ?C)   Resp 18   Ht 5' 2" (1.575 m)   Wt 200 lb (90.7 kg)   BMI 36.58 kg/m?  ? ? ?  04/03/2021  ?  8:27 AM 10/04/2020  ?  7:54 AM 09/12/2020  ? 10:12 AM  ?BP/Weight  ?Systolic BP 035 465 681  ?Diastolic BP 78 80 79  ?Wt. (Lbs) 200 194 193.3  ?BMI 36.58 kg/m2 35.48 kg/m2 36.52 kg/m2  ? ? ?Physical Exam ?Vitals reviewed.  ?Constitutional:   ?   Appearance: Normal appearance. She is normal weight.  ?Neck:  ?   Vascular: No carotid bruit.  ?Cardiovascular:  ?   Rate and Rhythm: Normal rate and regular rhythm.  ?   Heart sounds: Normal heart sounds.  ?Pulmonary:  ?   Effort: Pulmonary effort is normal. No respiratory  distress.  ?   Breath sounds: Normal breath sounds.  ?Abdominal:  ?   General: Abdomen is flat. Bowel sounds are normal.  ?   Palpations: Abdomen is soft.  ?   Tenderness: There is no abdominal tenderness.  ?Musculoskeletal:     ?   General: Tenderness (FM trigger points positive.) present.  ?  Neurological:  ?   Mental Status: She is alert and oriented to person, place, and time.  ?Psychiatric:     ?   Mood and Affect: Mood normal.     ?   Behavior: Behavior normal.  ? ? ?Diabetic Foot Exam - Simple   ?Simple Foot Form ?Diabetic Foot exam was performed with the following findings: Yes 04/03/2021  9:13 AM  ?Visual Inspection ?See comments: Yes ?Sensation Testing ?Intact to touch and monofilament testing bilaterally: Yes ?Pulse Check ?Posterior Tibialis and Dorsalis pulse intact bilaterally: Yes ?Comments ?Flat feet. Crossing first and second digits of BL feet.  ?  ?  ? ?Lab Results  ?Component Value Date  ? WBC 10.3 04/03/2021  ? HGB 12.9 04/03/2021  ? HCT 39.3 04/03/2021  ? PLT 446 04/03/2021  ? GLUCOSE 104 (H) 04/03/2021  ? CHOL 154 04/03/2021  ? TRIG 279 (H) 04/03/2021  ? HDL 40 04/03/2021  ? [place] 69 04/03/2021  ? ALT 14 04/03/2021  ? AST 15 04/03/2021  ? NA 138 04/03/2021  ? K 4.2 04/03/2021  ? CL 96 04/03/2021  ? CREATININE 1.29 (H) 04/03/2021  ? BUN 10 04/03/2021  ? CO2 28 04/03/2021  ? TSH 1.310 09/05/2020  ? HGBA1C 6.5 (H) 04/03/2021  ? MICROALBUR 80 09/05/2020  ? ? ? ? ?Assessment & Plan:  ? ?Problem List Items Addressed This Visit   ? ?  ? Cardiovascular and Mediastinum  ? Hypertension associated with diabetes ([place])  ?  The current medical regimen is effective;  continue present plan and medications. Continue aldactazide and valsartan. Remain off diltiazem. ?Recommend continue to work on eating healthy diet and exercise. ?Check labs. ?  ?  ? Relevant Medications  ? metFORMIN (GLUCOPHAGE) 500 MG tablet  ? pravastatin (PRAVACHOL) 20 MG tablet  ? spironolactone-hydrochlorothiazide (ALDACTAZIDE) 25-25 MG  tablet  ? valsartan (DIOVAN) 80 MG tablet  ? Other Relevant Orders  ? Cardiovascular Risk Assessment (Completed)  ?  ? Respiratory  ? Allergic rhinitis due to allergen  ?  Continue claritin ?  ?  ? Relevant Medication

## 2021-04-03 NOTE — Assessment & Plan Note (Signed)
The current medical regimen is effective;  continue present plan and medications. Continue omeprazole 40 mg daily  

## 2021-04-03 NOTE — Assessment & Plan Note (Signed)
>>  ASSESSMENT AND PLAN FOR ASTHMA WRITTEN ON 04/03/2021 10:09 PM BY COX, KIRSTEN, MD  The current medical regimen is effective;  continue present plan and medications. Uses albuterol  as needed only.

## 2021-04-03 NOTE — Assessment & Plan Note (Signed)
The current medical regimen is effective;  continue present plan and medications. Continue aldactazide and valsartan. Remain off diltiazem. ?Recommend continue to work on eating healthy diet and exercise. ?Check labs. ?

## 2021-04-03 NOTE — Progress Notes (Signed)
Subjective: ?Andrea White is a 66 y.o. female patient who returns to office for diabetic nail care.  Patient reports that it is time for new diabetic shoes. ?Andrea Maxwell, MD, PCP last visit this morning ?Fasting blood sugar not recorded ?Last A1c 6.4 ? ?Patient Active Problem List  ? Diagnosis Date Noted  ? Hx of adenomatous colonic polyps 09/12/2020  ?  Class: History of  ? Diabetic polyneuropathy associated with type 2 diabetes mellitus ([place]) 04/30/2019  ? Essential hypertension 04/30/2019  ? Mixed hyperlipidemia 04/30/2019  ? Mixed diabetic hyperlipidemia associated with type 2 diabetes mellitus ([place]) 04/30/2019  ? Major depressive disorder, single episode, mild ([place]) 04/30/2019  ? Leukocytosis 04/17/2018  ?  Class: Minor  ? Thrombocytosis, unspecified 04/17/2018  ?  Class: Diagnosis of  ? Myocardial infarction ([place]) 10/31/2017  ? Primary fibromyalgia syndrome 10/31/2017  ? Gastroesophageal reflux disease 03/15/2015  ? Asthma 03/15/2015  ? Obesity 03/15/2015  ? Herniated disc 06/10/2011  ? History of heart attack 06/10/2011  ? Cyst of ovary   ? ? ?Current Outpatient Medications on File Prior to Visit  ?Medication Sig Dispense Refill  ? ALPRAZolam (XANAX) 0.5 MG tablet TAKE 1/2 TO 1 TABLET BY MOUTH EVERY 8 HOURS AS NEEDED. 30 tablet 5  ? APPLE CIDER VINEGAR PO Take by mouth daily.    ? Biotin 10000 MCG TABS Take 1 tablet by mouth daily.    ? Blood Glucose Monitoring Suppl (TRUE METRIX METER) w/Device KIT USE AS DIRECTED 1 kit 0  ? diclofenac (VOLTAREN) 75 MG EC tablet Take 1 tablet (75 mg total) by mouth 2 (two) times daily. 180 tablet 1  ? diclofenac Sodium (VOLTAREN) 1 % GEL Apply 4 g topically 4 (four) times daily as needed (knees, foot, or hand pain). 1000 g 3  ? diltiazem (CARDIZEM) 90 MG tablet Take 1 tablet (90 mg total) by mouth 2 (two) times daily. Take 1 tablet (90 mg total) by mouth 2 (two) times daily. (Patient not taking: Reported on 04/03/2021) 180 tablet 1  ? DULoxetine (CYMBALTA) 60 MG capsule  Take 1 capsule (60 mg total) by mouth daily. 90 capsule 1  ? loratadine (CLARITIN) 10 MG tablet Take 1 tablet (10 mg total) by mouth daily. 90 tablet 3  ? meclizine (ANTIVERT) 25 MG tablet Take 1 tablet (25 mg total) by mouth 2 (two) times daily. 180 tablet 1  ? metFORMIN (GLUCOPHAGE) 500 MG tablet Take 1 tablet (500 mg total) by mouth 2 (two) times daily with a meal. 180 tablet 1  ? Multiple Vitamin (MULTIVITAMIN) tablet Take 1 tablet by mouth daily.    ? omeprazole (PRILOSEC) 40 MG capsule Take 1 capsule (40 mg total) by mouth 2 (two) times daily. 180 capsule 1  ? oxybutynin (DITROPAN) 5 MG tablet Take 1 tablet (5 mg total) by mouth 3 (three) times daily. 270 tablet 0  ? pravastatin (PRAVACHOL) 20 MG tablet Take 1 tablet (20 mg total) by mouth daily. 90 tablet 0  ? PROVENTIL HFA 108 (90 Base) MCG/ACT inhaler INHALE 2 PUFFS BY MOUTH FOUR TIMES A DAY AS NEEDED FOR WHEEZING 1 each 2  ? spironolactone-hydrochlorothiazide (ALDACTAZIDE) 25-25 MG tablet Take 1 tablet by mouth daily. 90 tablet 0  ? traMADol (ULTRAM) 50 MG tablet Take 1 tablet (50 mg total) by mouth 3 (three) times daily as needed. 90 tablet 1  ? TRUE METRIX BLOOD GLUCOSE TEST test strip USE AS DIRECTED TO CHECK FASTING BLOOD SUGAR. 300 strip 2  ? TRUEplus Lancets  33G MISC E11.69 use new lancet each time when checking FBS 100 each 2  ? valsartan (DIOVAN) 80 MG tablet Take 1 tablet (80 mg total) by mouth daily. 90 tablet 3  ? ?No current facility-administered medications on file prior to visit.  ? ? ?Allergies  ?Allergen Reactions  ? Morphine And Related Other (See Comments)  ?  Woozy, "out of body experience"  ? Celebrex [Celecoxib] Other (See Comments)  ?  Patient does not remember intolerance  ? Gabapentin Other (See Comments)  ?  Confusion  ? Lisinopril Cough  ? Guaifenesin & Derivatives Other (See Comments)  ?  Patient cannot remember specific issue, just made her feel "weird"  ? Lipitor [Atorvastatin] Rash  ? Sulfa Antibiotics Rash  ? Valium  [Diazepam] Other (See Comments)  ?  Lightheaded, dizziness   ? ? ?Objective:  ?General: Alert and oriented x3 in no acute distress ? ?Dermatology: Nails x10 elongated thickened dystrophic consistent with onychomycosis.   ? ?Vascular: Dorsalis Pedis and Posterior Tibial pedal pulses palpable, Capillary Fill Time 5 seconds,(+) pedal hair growth bilateral, no edema bilateral lower extremities, Temperature gradient within normal limits. ? ?Neurology: Gross sensation intact via light touch bilateral. ? ?Musculoskeletal: + pes planus foot type, bunion and hammertoe deformity that is currently asymptomatic. ? ? ?Assessment and Plan: ?Problem List Items Addressed This Visit   ? ?  ? Endocrine  ? Diabetic polyneuropathy associated with type 2 diabetes mellitus ([place])  ? ?Other Visit Diagnoses   ? ? Pain due to onychomycosis of toenails of both feet    -  Primary  ? Pes planus of both feet      ? Bunion      ? Hammer toes of both feet      ? Osteoarthritis of both feet, unspecified osteoarthritis type      ? ?  ? ? ?-Complete examination performed ?-Mechanically debrided nails x10 using a sterile nipper without incident except minor bleeding noted to the left fourth toe which was treated with Lumicain advised patient to monitor if worsens to return to office sooner ?-Continue with good supportive shoes daily for foot type until she can be remeasured for new diabetic shoes for the year ?-Patient to return to office for in 3 months for nail care and scheduled for diabetic shoe measurements or sooner if condition worsens.   ? ?Landis Martins, DPM ? ?

## 2021-04-03 NOTE — Assessment & Plan Note (Signed)
Control: good ?Recommend check sugars fasting daily. ?Recommend check feet daily. ?Recommend annual eye exams. ?Medicines: Continue metformin. ?Continue to work on eating a healthy diet and exercise.  ?Labs drawn today.   ? ?

## 2021-04-09 ENCOUNTER — Encounter: Payer: Self-pay | Admitting: Family Medicine

## 2021-04-09 LAB — COMPREHENSIVE METABOLIC PANEL
ALT: 14 IU/L (ref 0–32)
AST: 15 IU/L (ref 0–40)
Albumin/Globulin Ratio: 2 (ref 1.2–2.2)
Albumin: 4.3 g/dL (ref 3.8–4.8)
Alkaline Phosphatase: 69 IU/L (ref 44–121)
BUN/Creatinine Ratio: 8 — ABNORMAL LOW (ref 12–28)
BUN: 10 mg/dL (ref 8–27)
Bilirubin Total: 0.7 mg/dL (ref 0.0–1.2)
CO2: 28 mmol/L (ref 20–29)
Calcium: 10.1 mg/dL (ref 8.7–10.3)
Chloride: 96 mmol/L (ref 96–106)
Creatinine, Ser: 1.29 mg/dL — ABNORMAL HIGH (ref 0.57–1.00)
Globulin, Total: 2.2 g/dL (ref 1.5–4.5)
Glucose: 104 mg/dL — ABNORMAL HIGH (ref 70–99)
Potassium: 4.2 mmol/L (ref 3.5–5.2)
Sodium: 138 mmol/L (ref 134–144)
Total Protein: 6.5 g/dL (ref 6.0–8.5)
eGFR: 47 mL/min/{1.73_m2} — ABNORMAL LOW (ref >59)

## 2021-04-09 LAB — CBC WITH DIFFERENTIAL/PLATELET
Basophils Absolute: 0.1 10*3/uL (ref 0.0–0.2)
Basos: 1 %
EOS (ABSOLUTE): 0.4 10*3/uL (ref 0.0–0.4)
Eos: 4 %
Hematocrit: 39.3 % (ref 34.0–46.6)
Hemoglobin: 12.9 g/dL (ref 11.1–15.9)
Immature Grans (Abs): 0.1 10*3/uL (ref 0.0–0.1)
Immature Granulocytes: 1 %
Lymphocytes Absolute: 3.9 10*3/uL — ABNORMAL HIGH (ref 0.7–3.1)
Lymphs: 38 %
MCH: 30.6 pg (ref 26.6–33.0)
MCHC: 32.8 g/dL (ref 31.5–35.7)
MCV: 93 fL (ref 79–97)
Monocytes Absolute: 1 10*3/uL — ABNORMAL HIGH (ref 0.1–0.9)
Monocytes: 9 %
Neutrophils Absolute: 4.9 10*3/uL (ref 1.4–7.0)
Neutrophils: 47 %
Platelets: 446 10*3/uL (ref 150–450)
RBC: 4.22 x10E6/uL (ref 3.77–5.28)
RDW: 12.5 % (ref 11.7–15.4)
WBC: 10.3 10*3/uL (ref 3.4–10.8)

## 2021-04-09 LAB — LIPID PANEL
Chol/HDL Ratio: 3.9 ratio (ref 0.0–4.4)
Cholesterol, Total: 154 mg/dL (ref 100–199)
HDL: 40 mg/dL (ref >39)
LDL Chol Calc (NIH): 69 mg/dL (ref 0–99)
Triglycerides: 279 mg/dL — ABNORMAL HIGH (ref 0–149)
VLDL Cholesterol Cal: 45 mg/dL — ABNORMAL HIGH (ref 5–40)

## 2021-04-09 LAB — MICROALBUMIN / CREATININE URINE RATIO
Creatinine, Urine: 113 mg/dL
Microalb/Creat Ratio: 3 mg/g creat (ref 0–29)
Microalbumin, Urine: 3.7 ug/mL

## 2021-04-09 LAB — CARDIOVASCULAR RISK ASSESSMENT

## 2021-04-09 LAB — HEMOGLOBIN A1C
Est. average glucose Bld gHb Est-mCnc: 140 mg/dL
Hgb A1c MFr Bld: 6.5 % — ABNORMAL HIGH (ref 4.8–5.6)

## 2021-04-10 ENCOUNTER — Other Ambulatory Visit: Payer: Self-pay

## 2021-04-10 DIAGNOSIS — N289 Disorder of kidney and ureter, unspecified: Secondary | ICD-10-CM

## 2021-04-10 MED ORDER — FENOFIBRATE 160 MG PO TABS: 160.0000 mg | ORAL_TABLET | Freq: Every day | ORAL | 0 refills | Status: DC

## 2021-04-10 MED ORDER — PROVENTIL HFA 108 (90 BASE) MCG/ACT IN AERS: INHALATION_SPRAY | RESPIRATORY_TRACT | 2 refills | Status: DC

## 2021-04-10 NOTE — Telephone Encounter (Signed)
Refill sent to pharmacy.   

## 2021-04-11 ENCOUNTER — Telehealth: Payer: Medicare HMO

## 2021-04-12 ENCOUNTER — Other Ambulatory Visit: Payer: Self-pay | Admitting: Family Medicine

## 2021-04-18 ENCOUNTER — Other Ambulatory Visit: Payer: Self-pay

## 2021-04-18 ENCOUNTER — Ambulatory Visit: Payer: Medicare HMO

## 2021-04-18 DIAGNOSIS — E1142 Type 2 diabetes mellitus with diabetic polyneuropathy: Secondary | ICD-10-CM

## 2021-04-18 DIAGNOSIS — M2142 Flat foot [pes planus] (acquired), left foot: Secondary | ICD-10-CM

## 2021-04-18 DIAGNOSIS — M21619 Bunion of unspecified foot: Secondary | ICD-10-CM

## 2021-04-18 DIAGNOSIS — M2041 Other hammer toe(s) (acquired), right foot: Secondary | ICD-10-CM

## 2021-04-18 NOTE — Progress Notes (Signed)
SITUATION ?Reason for Consult: Evaluation for Prefabricated Diabetic Shoes and Custom Diabetic Inserts. ?Patient / Caregiver Report: Patient would like well fitting shoes ? ?OBJECTIVE DATA: ?Patient History / Diagnosis:  ?  ICD-10-CM   ?1. Diabetic polyneuropathy associated with type 2 diabetes mellitus ([place])  E11.42   ?  ?2. Pes planus of both feet  M21.41   ? M21.42   ?  ?3. Bunion  M21.619   ?  ?4. Hammer toes of both feet  M20.41   ? M20.42   ?  ? ? ?Physician Treating Diabetes:  [name], MD ? ?Current or Previous Devices:   Current user ? ?In-Person Foot Examination: ?Ulcers & Callousing:   None ?Deformities:    Hammertoes, bunions, pes planus ?Sensation:    compromised  ?Shoe Size:     9W ? ?ORTHOTIC RECOMMENDATION ?Recommended Devices: ?- 1x pair prefabricated PDAC approved diabetic shoes; Patient Selected [employer] ?- 3x pair custom-to-patient PDAC approved vacuum formed diabetic insoles. ? ?GOALS OF SHOES AND INSOLES ?- Reduce shear and pressure ?- Reduce / Prevent callus formation ?- Reduce / Prevent ulceration ?- Protect the fragile healing compromised diabetic foot. ? ?Patient would benefit from diabetic shoes and inserts as patient has diabetes mellitus and the patient has one or more of the following conditions: ?- History of partial or complete amputation of the foot ?- History of previous foot ulceration. ?- History of pre-ulcerative callus ?- Peripheral neuropathy with evidence of callus formation ?- Foot deformity ?- Poor circulation ? ?ACTIONS PERFORMED ?Potential out of pocket cost was communicated to patient. Patient understood and consented to measurement and casting. Patient was casted for insoles via crush box and measured for shoes via brannock device. Procedure was explained and patient tolerated procedure well. All questions were answered and concerns addressed. Casts were shipped to central fabrication for HOLD until Certificate of Medical Necessity or  otherwise necessary authorization from insurance is obtained. ? ?PLAN ?Shoes are to be ordered and casts released from hold once all appropriate paperwork is complete. Patient is to be contacted and scheduled for fitting once shoes and insoles have been fabricated and received. ? ?

## 2021-04-22 DIAGNOSIS — J309 Allergic rhinitis, unspecified: Secondary | ICD-10-CM | POA: Insufficient documentation

## 2021-04-22 DIAGNOSIS — Z1211 Encounter for screening for malignant neoplasm of colon: Secondary | ICD-10-CM | POA: Insufficient documentation

## 2021-04-22 NOTE — Assessment & Plan Note (Signed)
Continue claritin. °

## 2021-04-22 NOTE — Assessment & Plan Note (Signed)
Continue duloxetine 60 mg before bed. Takes tramadol about once daily. Taking diclofenac.  ?

## 2021-04-22 NOTE — Assessment & Plan Note (Signed)
Well controlled.  No changes to medicines. Continue pravastatin 20 mg daily.  Continue to work on eating a healthy diet and exercise.  Labs drawn today.   

## 2021-04-22 NOTE — Assessment & Plan Note (Signed)
Refer to GI 

## 2021-04-23 DIAGNOSIS — M5136 Other intervertebral disc degeneration, lumbar region: Secondary | ICD-10-CM | POA: Diagnosis not present

## 2021-04-23 DIAGNOSIS — M791 Myalgia, unspecified site: Secondary | ICD-10-CM | POA: Diagnosis not present

## 2021-04-23 DIAGNOSIS — Z79891 Long term (current) use of opiate analgesic: Secondary | ICD-10-CM | POA: Diagnosis not present

## 2021-04-23 DIAGNOSIS — Z79899 Other long term (current) drug therapy: Secondary | ICD-10-CM | POA: Diagnosis not present

## 2021-04-23 DIAGNOSIS — M7918 Myalgia, other site: Secondary | ICD-10-CM | POA: Diagnosis not present

## 2021-04-23 DIAGNOSIS — G894 Chronic pain syndrome: Secondary | ICD-10-CM | POA: Diagnosis not present

## 2021-05-07 ENCOUNTER — Other Ambulatory Visit: Payer: Self-pay

## 2021-05-07 MED ORDER — FENOFIBRATE 160 MG PO TABS: 160.0000 mg | ORAL_TABLET | Freq: Every day | ORAL | 0 refills | Status: DC

## 2021-05-08 ENCOUNTER — Other Ambulatory Visit: Payer: Self-pay

## 2021-05-08 MED ORDER — FENOFIBRATE 160 MG PO TABS: 160.0000 mg | ORAL_TABLET | Freq: Every day | ORAL | 0 refills | Status: DC

## 2021-05-11 ENCOUNTER — Other Ambulatory Visit: Payer: Medicare HMO

## 2021-05-11 DIAGNOSIS — N289 Disorder of kidney and ureter, unspecified: Secondary | ICD-10-CM | POA: Diagnosis not present

## 2021-05-11 LAB — COMPREHENSIVE METABOLIC PANEL
ALT: 19 IU/L (ref 0–32)
AST: 25 IU/L (ref 0–40)
Albumin/Globulin Ratio: 1.8 (ref 1.2–2.2)
Albumin: 4.5 g/dL (ref 3.8–4.8)
Alkaline Phosphatase: 59 IU/L (ref 44–121)
BUN/Creatinine Ratio: 13 (ref 12–28)
BUN: 23 mg/dL (ref 8–27)
Bilirubin Total: 0.6 mg/dL (ref 0.0–1.2)
CO2: 24 mmol/L (ref 20–29)
Calcium: 10.2 mg/dL (ref 8.7–10.3)
Chloride: 96 mmol/L (ref 96–106)
Creatinine, Ser: 1.71 mg/dL — ABNORMAL HIGH (ref 0.57–1.00)
Globulin, Total: 2.5 g/dL (ref 1.5–4.5)
Glucose: 113 mg/dL — ABNORMAL HIGH (ref 70–99)
Potassium: 4.2 mmol/L (ref 3.5–5.2)
Sodium: 138 mmol/L (ref 134–144)
Total Protein: 7 g/dL (ref 6.0–8.5)
eGFR: 33 mL/min/{1.73_m2} — ABNORMAL LOW (ref >59)

## 2021-05-12 ENCOUNTER — Encounter: Payer: Self-pay | Admitting: Family Medicine

## 2021-05-15 ENCOUNTER — Ambulatory Visit (INDEPENDENT_AMBULATORY_CARE_PROVIDER_SITE_OTHER): Payer: Medicare HMO | Admitting: Family Medicine

## 2021-05-15 ENCOUNTER — Encounter: Payer: Self-pay | Admitting: Family Medicine

## 2021-05-15 VITALS — BP 102/78 | HR 64 | Temp 97.2°F | Ht 62.0 in | Wt 191.0 lb

## 2021-05-15 DIAGNOSIS — G8929 Other chronic pain: Secondary | ICD-10-CM

## 2021-05-15 DIAGNOSIS — R42 Dizziness and giddiness: Secondary | ICD-10-CM

## 2021-05-15 DIAGNOSIS — I129 Hypertensive chronic kidney disease with stage 1 through stage 4 chronic kidney disease, or unspecified chronic kidney disease: Secondary | ICD-10-CM

## 2021-05-15 DIAGNOSIS — M797 Fibromyalgia: Secondary | ICD-10-CM

## 2021-05-15 DIAGNOSIS — M545 Low back pain, unspecified: Secondary | ICD-10-CM

## 2021-05-15 DIAGNOSIS — N1832 Chronic kidney disease, stage 3b: Secondary | ICD-10-CM

## 2021-05-15 DIAGNOSIS — E1122 Type 2 diabetes mellitus with diabetic chronic kidney disease: Secondary | ICD-10-CM

## 2021-05-15 DIAGNOSIS — I130 Hypertensive heart and chronic kidney disease with heart failure and stage 1 through stage 4 chronic kidney disease, or unspecified chronic kidney disease: Secondary | ICD-10-CM | POA: Insufficient documentation

## 2021-05-15 NOTE — Assessment & Plan Note (Signed)
Increase cymbalta 60 mg twice daily.  ?May take tylenol 500 mg 2 four times a day as needed breakthrough pain.  ?Continue tramadol 50 mg one up to four times a day as needed  ?

## 2021-05-15 NOTE — Assessment & Plan Note (Addendum)
Resolved since off fenofibrate.  ? ?

## 2021-05-15 NOTE — Progress Notes (Signed)
? ?Acute Office Visit ? ?Subjective:  ? ? Patient ID: Andrea White, female    DOB: 1955/09/12, 66 y.o.   MRN: [id number] ? ?Chief Complaint  ?Patient presents with  ? Follow up Cholesterol med  ? ? ?HPI ?Patient is in today for vertigo. Started within three days of starting fenofibrate. Stopped fenofibrate 2 days ago. Since discontinuation her vertigo has improved, but not fully resolved.  ?Kidney function worsened so I had discontinued diclofenac. Recheck CMP on 05/11/21 and gfr had worsened.  No otc nsaids. Her back has been "killing her" since came of diclofenac. Her fibromyalgia has also worsened.   ?Her ability to be active has deteriorated.  Tramadol isn't helping on anything.  Taking tramadol 50 mg three times a day. Ice packs help some.   ? ?Past Medical History:  ?Diagnosis Date  ? Allergy   ? Anxiety   ? Arthritis   ? Bradycardia, unspecified   ? Bruises easily   ? Calf pain   ? when walking  ? Fibromyalgia   ? Frequent headaches   ? GERD (gastroesophageal reflux disease)   ? H/O bladder problems   ? Hypertension   ? IBS (irritable bowel syndrome)   ? Localized edema   ? Mild persistent asthma, uncomplicated   ? Morbid (severe) obesity due to excess calories ([place])   ? Muscle pain   ? Myocardial infarction [facility])   ? Other hypotension   ? Ovarian cyst   ? Slow transit constipation   ? Solitary pulmonary nodule   ? Swelling   ? feet and legs  ? Type 2 diabetes mellitus with diabetic nephropathy ([place])   ? Varicose veins of bilateral lower extremities with pain   ? ? ?Past Surgical History:  ?Procedure Laterality Date  ? BUNIONECTOMY    ? left foot 5th toe  ? CARDIAC CATHETERIZATION    ? carpel tunnel surgery    ? CESAREAN SECTION    ? CHOLECYSTECTOMY    ? GALLBLADDER SURGERY    ? TONSILLECTOMY    ? TUBAL LIGATION    ? VAGINAL HYSTERECTOMY  2003  ? LAVH/SR  ? ? ?Family History  ?Problem Relation Age of Onset  ? Heart disease Father   ? Hypertension Father   ? Stroke Father   ? Heart attack Father   ?  Cerebrovascular Accident Father   ? Diabetes Sister   ? Kidney disease Sister   ? Migraines Daughter   ? Breast cancer Maternal Aunt   ? Cancer Maternal Aunt 74  ?     breast  ? ? ?Social History  ? ?Socioeconomic History  ? Marital status: Married  ?  Spouse name: Not on file  ? Number of children: 1  ? Years of education: Not on file  ? Highest education level: Not on file  ?Occupational History  ? Occupation: Disabled due to foot problems since 1993  ?Tobacco Use  ? Smoking status: Never  ? Smokeless tobacco: Never  ?Vaping Use  ? Vaping Use: Never used  ?Substance and Sexual Activity  ? Alcohol use: No  ? Drug use: No  ? Sexual activity: Yes  ?  Birth control/protection: Surgical  ?  Comment: hyst  ?Other Topics Concern  ? Not on file  ?Social History Narrative  ? Not on file  ? ?Social Determinants of Health  ? ?Financial Resource Strain: Not on file  ?Food Insecurity: Not on file  ?Transportation Needs: Not on file  ?Physical Activity: Insufficiently  Active  ? Days of Exercise per Week: 4 days  ? Minutes of Exercise per Session: 30 min  ?Stress: Not on file  ?Social Connections: Not on file  ?Intimate Partner Violence: Not on file  ? ? ?Outpatient Medications Prior to Visit  ?Medication Sig Dispense Refill  ? albuterol (VENTOLIN HFA) 108 (90 Base) MCG/ACT inhaler INHALE 2 PUFFS BY MOUTH 4 TIMES DAILY AS NEEDED FOR WHEEZING 7 each 2  ? ALPRAZolam (XANAX) 0.5 MG tablet TAKE 1/2 TO 1 TABLET BY MOUTH EVERY 8 HOURS AS NEEDED. 30 tablet 5  ? APPLE CIDER VINEGAR PO Take by mouth daily.    ? Biotin 10000 MCG TABS Take 1 tablet by mouth daily.    ? Blood Glucose Monitoring Suppl (TRUE METRIX METER) w/Device KIT USE AS DIRECTED 1 kit 0  ? diclofenac Sodium (VOLTAREN) 1 % GEL Apply 4 g topically 4 (four) times daily as needed (knees, foot, or hand pain). 1000 g 3  ? DULoxetine (CYMBALTA) 60 MG capsule Take 1 capsule (60 mg total) by mouth daily. 90 capsule 1  ? loratadine (CLARITIN) 10 MG tablet Take 1 tablet (10 mg  total) by mouth daily. 90 tablet 3  ? meclizine (ANTIVERT) 25 MG tablet Take 1 tablet (25 mg total) by mouth 2 (two) times daily. 180 tablet 1  ? metFORMIN (GLUCOPHAGE) 500 MG tablet Take 1 tablet (500 mg total) by mouth 2 (two) times daily with a meal. 180 tablet 1  ? Multiple Vitamin (MULTIVITAMIN) tablet Take 1 tablet by mouth daily.    ? omeprazole (PRILOSEC) 40 MG capsule Take 1 capsule (40 mg total) by mouth 2 (two) times daily. 180 capsule 1  ? oxybutynin (DITROPAN) 5 MG tablet Take 1 tablet (5 mg total) by mouth 3 (three) times daily. 270 tablet 0  ? pravastatin (PRAVACHOL) 20 MG tablet Take 1 tablet (20 mg total) by mouth daily. 90 tablet 0  ? spironolactone-hydrochlorothiazide (ALDACTAZIDE) 25-25 MG tablet Take 1 tablet by mouth daily. 90 tablet 0  ? traMADol (ULTRAM) 50 MG tablet Take 1 tablet (50 mg total) by mouth 3 (three) times daily as needed. 90 tablet 1  ? TRUE METRIX BLOOD GLUCOSE TEST test strip USE AS DIRECTED TO CHECK FASTING BLOOD SUGAR. 300 strip 2  ? TRUEplus Lancets 33G MISC E11.69 use new lancet each time when checking FBS 100 each 2  ? valsartan (DIOVAN) 80 MG tablet Take 1 tablet (80 mg total) by mouth daily. 90 tablet 3  ? diltiazem (CARDIZEM) 90 MG tablet Take 1 tablet (90 mg total) by mouth 2 (two) times daily. Take 1 tablet (90 mg total) by mouth 2 (two) times daily. (Patient not taking: Reported on 04/03/2021) 180 tablet 1  ? fenofibrate 160 MG tablet Take 1 tablet (160 mg total) by mouth daily. 90 tablet 0  ? ?No facility-administered medications prior to visit.  ? ? ?Allergies  ?Allergen Reactions  ? Morphine And Related Other (See Comments)  ?  Woozy, "out of body experience"  ? Celebrex [Celecoxib] Other (See Comments)  ?  Patient does not remember intolerance  ? Fenofibrate   ?  Vertigo ?  ? Gabapentin Other (See Comments)  ?  Confusion  ? Lisinopril Cough  ? Guaifenesin & Derivatives Other (See Comments)  ?  Patient cannot remember specific issue, just made her feel "weird"  ?  Lipitor [Atorvastatin] Rash  ? Sulfa Antibiotics Rash  ? Valium [Diazepam] Other (See Comments)  ?  Lightheaded, dizziness   ? ? ?  Review of Systems  ?Constitutional:  Negative for appetite change, fatigue and fever.  ?HENT:  Negative for congestion, ear pain, sinus pressure and sore throat.   ?Respiratory:  Negative for cough, chest tightness, shortness of breath and wheezing.   ?Cardiovascular:  Negative for chest pain and palpitations.  ?Gastrointestinal:  Negative for abdominal pain, constipation, diarrhea, nausea and vomiting.  ?Genitourinary:  Negative for dysuria and hematuria.  ?Musculoskeletal:  Negative for arthralgias, back pain, joint swelling and myalgias.  ?Skin:  Negative for rash.  ?Neurological:  Positive for dizziness (Due to taking fenofibrate). Negative for weakness and headaches.  ?Psychiatric/Behavioral:  Negative for dysphoric mood. The patient is not nervous/anxious.   ? ?   ?Objective:  ?  ?Physical Exam ?Vitals reviewed.  ?Constitutional:   ?   Appearance: Normal appearance. She is normal weight.  ?Cardiovascular:  ?   Rate and Rhythm: Normal rate and regular rhythm.  ?   Pulses: Normal pulses.  ?   Heart sounds: Normal heart sounds.  ?Pulmonary:  ?   Effort: Pulmonary effort is normal.  ?   Breath sounds: Normal breath sounds.  ?Abdominal:  ?   General: Abdomen is flat. Bowel sounds are normal.  ?   Palpations: Abdomen is soft.  ?Neurological:  ?   Mental Status: She is alert and oriented to person, place, and time.  ?Psychiatric:     ?   Mood and Affect: Mood normal.     ?   Behavior: Behavior normal.  ? ? ?BP 102/78 (BP Location: Left Arm, Patient Position: Sitting)   Pulse 64   Temp (!) 97.2 ?F (36.2 ?C) (Temporal)   Ht $R'5\' 2"'xO$  (1.575 m)   Wt 191 lb (86.6 kg)   SpO2 99%   BMI 34.93 kg/m?  ?Wt Readings from Last 3 Encounters:  ?05/15/21 191 lb (86.6 kg)  ?04/03/21 200 lb (90.7 kg)  ?10/04/20 194 lb (88 kg)  ? ? ?Health Maintenance Due  ?Topic Date Due  ? HIV Screening  Never done  ?  Hepatitis C Screening  Never done  ? PAP SMEAR-Modifier  01/22/2016  ? COLONOSCOPY (Pts 45-81yrs Insurance coverage will need to be confirmed)  05/14/2016  ? COVID-19 Vaccine (3 - Moderna risk series) 09/20/2019  ? ? ?T

## 2021-05-15 NOTE — Assessment & Plan Note (Signed)
>>  ASSESSMENT AND PLAN FOR HYPERTENSION ASSOCIATED WITH STAGE 3B CHRONIC KIDNEY DISEASE DUE TO TYPE 2 DIABETES MELLITUS ([place]) WRITTEN ON 05/20/2021  5:10 PM BY COX, KIRSTEN, MD  HOLD spironolactone-hydrochlorothiazide. Hold metformin.  Check sugars daily.  Check bps daily.

## 2021-05-15 NOTE — Patient Instructions (Addendum)
HOLD spironolactone-hydrochlorothiazide. ?Stop fenofibrate.  ?Hold metformin.  ? ?Check sugars daily.  ?Check bps daily.  ? ?Increase cymbalta 60 mg twice daily.  ?May take tylenol 500 mg 2 four times a day as needed breakthrough pain.  ?Continue tramadol 50 mg one up to four times a day as needed  ? ? ? ?

## 2021-05-15 NOTE — Assessment & Plan Note (Addendum)
HOLD spironolactone-hydrochlorothiazide. ?Hold metformin.  ?Check sugars daily.  ?Check bps daily.  ?

## 2021-05-17 ENCOUNTER — Encounter: Payer: Self-pay | Admitting: Family Medicine

## 2021-05-17 ENCOUNTER — Other Ambulatory Visit: Payer: Self-pay | Admitting: Family Medicine

## 2021-05-17 DIAGNOSIS — N184 Chronic kidney disease, stage 4 (severe): Secondary | ICD-10-CM

## 2021-05-24 ENCOUNTER — Other Ambulatory Visit (INDEPENDENT_AMBULATORY_CARE_PROVIDER_SITE_OTHER): Payer: Medicare HMO | Admitting: Family Medicine

## 2021-05-24 ENCOUNTER — Encounter: Payer: Self-pay | Admitting: Family Medicine

## 2021-05-24 DIAGNOSIS — R7989 Other specified abnormal findings of blood chemistry: Secondary | ICD-10-CM | POA: Diagnosis not present

## 2021-05-24 NOTE — Progress Notes (Signed)
Patient came with her husband today.  She showed me her blood pressures which have improved since stopping the spironolactone/hydrochlorothiazide.  Her blood pressures are more like 110-112 over 60s to 80s. ? ?In addition I held her metformin also due to the kidney dysfunction. ? ?We will check a CMP today. ? ?Dr Tobie Poet  ?

## 2021-05-25 LAB — COMPREHENSIVE METABOLIC PANEL
ALT: 13 IU/L (ref 0–32)
AST: 18 IU/L (ref 0–40)
Albumin/Globulin Ratio: 2.1 (ref 1.2–2.2)
Albumin: 3.7 g/dL — ABNORMAL LOW (ref 3.8–4.8)
Alkaline Phosphatase: 50 IU/L (ref 44–121)
BUN/Creatinine Ratio: 10 — ABNORMAL LOW (ref 12–28)
BUN: 14 mg/dL (ref 8–27)
Bilirubin Total: 0.3 mg/dL (ref 0.0–1.2)
CO2: 23 mmol/L (ref 20–29)
Calcium: 8.9 mg/dL (ref 8.7–10.3)
Chloride: 105 mmol/L (ref 96–106)
Creatinine, Ser: 1.43 mg/dL — ABNORMAL HIGH (ref 0.57–1.00)
Globulin, Total: 1.8 g/dL (ref 1.5–4.5)
Glucose: 89 mg/dL (ref 70–99)
Potassium: 4.1 mmol/L (ref 3.5–5.2)
Sodium: 142 mmol/L (ref 134–144)
Total Protein: 5.5 g/dL — ABNORMAL LOW (ref 6.0–8.5)
eGFR: 41 mL/min/{1.73_m2} — ABNORMAL LOW (ref >59)

## 2021-05-30 ENCOUNTER — Telehealth: Payer: Self-pay

## 2021-05-30 NOTE — Chronic Care Management (AMB) (Signed)
? ? ?Chronic Care Management ?Pharmacy Assistant  ? ?Name: Andrea White  MRN: [id number] DOB: 12/30/1955 ? ?Reason for Encounter: Disease State/ Hypertension ? ?Recent office visits:  ?05-15-2021 [name], MD. STOP ditiazem and fenofibrate. INCREASE cymbalta 60 mg daily TO 60 mg twice daily. HOLD metformin and spironolactone/ HCTZ. ? ?04-03-2021 CoxElnita Maxwell, MD. Lymphocytes absolute= 3.9, Monocytes= 1.0. Glucose= 104, Creatinine= 1.25, eGFR= 47, BUN/Creatinine= 8. A1C= 6.5. Trig= 279, VLDL= 45. Referral placed to gastro.  ? ?Recent consult visits:  ?04-18-2021 Little, Olevia Bowens (Orthotics). Shoes are to be ordered and casts released from hold once all appropriate paperwork is complete. Patient is to be contacted and scheduled for fitting once shoes and insoles have been fabricated and received. ? ?04-03-2021 Landis Martins, DPM (Podiatry). Mechanically debrided nails x10 using a sterile nipper without incident except minor bleeding noted to the left fourth toe which was treated with Lumicain advised patient to monitor if worsens to return to office sooner ? ?Hospital visits:  ?None in previous 6 months ? ?Medications: ?Outpatient Encounter Medications as of 05/30/2021  ?Medication Sig  ? albuterol (VENTOLIN HFA) 108 (90 Base) MCG/ACT inhaler INHALE 2 PUFFS BY MOUTH 4 TIMES DAILY AS NEEDED FOR WHEEZING  ? ALPRAZolam (XANAX) 0.5 MG tablet TAKE 1/2 TO 1 TABLET BY MOUTH EVERY 8 HOURS AS NEEDED.  ? APPLE CIDER VINEGAR PO Take by mouth daily.  ? Biotin 10000 MCG TABS Take 1 tablet by mouth daily.  ? Blood Glucose Monitoring Suppl (TRUE METRIX METER) w/Device KIT USE AS DIRECTED  ? diclofenac Sodium (VOLTAREN) 1 % GEL Apply 4 g topically 4 (four) times daily as needed (knees, foot, or hand pain).  ? DULoxetine (CYMBALTA) 60 MG capsule Take 1 capsule (60 mg total) by mouth daily.  ? loratadine (CLARITIN) 10 MG tablet Take 1 tablet (10 mg total) by mouth daily.  ? meclizine (ANTIVERT) 25 MG tablet Take 1 tablet (25 mg  total) by mouth 2 (two) times daily.  ? Multiple Vitamin (MULTIVITAMIN) tablet Take 1 tablet by mouth daily.  ? omeprazole (PRILOSEC) 40 MG capsule Take 1 capsule (40 mg total) by mouth 2 (two) times daily.  ? oxybutynin (DITROPAN) 5 MG tablet Take 1 tablet (5 mg total) by mouth 3 (three) times daily.  ? pravastatin (PRAVACHOL) 20 MG tablet Take 1 tablet (20 mg total) by mouth daily.  ? traMADol (ULTRAM) 50 MG tablet Take 1 tablet (50 mg total) by mouth 3 (three) times daily as needed.  ? TRUE METRIX BLOOD GLUCOSE TEST test strip USE AS DIRECTED TO CHECK FASTING BLOOD SUGAR.  ? TRUEplus Lancets 33G MISC E11.69 use new lancet each time when checking FBS  ? valsartan (DIOVAN) 80 MG tablet Take 1 tablet (80 mg total) by mouth daily.  ? ?No facility-administered encounter medications on file as of 05/30/2021.  ? ?Recent Office Vitals: ?BP Readings from Last 3 Encounters:  ?05/15/21 102/78  ?04/03/21 108/78  ?10/04/20 112/80  ? ?Pulse Readings from Last 3 Encounters:  ?05/15/21 64  ?04/03/21 88  ?10/04/20 75  ?  ?Wt Readings from Last 3 Encounters:  ?05/15/21 191 lb (86.6 kg)  ?04/03/21 200 lb (90.7 kg)  ?10/04/20 194 lb (88 kg)  ?  ? ?Kidney Function ?Lab Results  ?Component Value Date/Time  ? CREATININE 1.43 (H) 05/24/2021 10:58 AM  ? CREATININE 1.71 (H) 05/11/2021 11:36 AM  ? GFRNONAA 73 10/29/2019 08:50 AM  ? GFRAA 84 10/29/2019 08:50 AM  ? ? ? ?  Latest Ref Rng &  Units 05/24/2021  ? 10:58 AM 05/11/2021  ? 11:36 AM 04/03/2021  ?  9:14 AM  ?BMP  ?Glucose 70 - 99 mg/dL 89   113   104    ?BUN 8 - 27 mg/dL $Remove'14   23   10    'nmYJsno$ ?Creatinine 0.57 - 1.00 mg/dL 1.43   1.71   1.29    ?BUN/Creat Ratio 12 - $Re'28 10   13   8    'fYn$ ?Sodium 134 - 144 mmol/L 142   138   138    ?Potassium 3.5 - 5.2 mmol/L 4.1   4.2   4.2    ?Chloride 96 - 106 mmol/L 105   96   96    ?CO2 20 - 29 mmol/L $RemoveB'23   24   28    'alfZgUDe$ ?Calcium 8.7 - 10.3 mg/dL 8.9   10.2   10.1    ? ? ?05-30-2021: 1st attempt left VM ?06-01-2021: 2nd attempt left VM ?06-04-2021: 3rd attempt left  VM ? ?Care Gaps: ?Last annual wellness visit? 05-30-2020 ?HIV screening overdue ?Hep c screening overdue ?PAP smear overdue ?Colonoscopy overdue ? ?Star Rating Drugs: ?Valsartan 80 mg- Last filled 04-23-2021 90 DS ?Pravastatin 20 mg- Last filled 03-24-2021 90 DS ? ?Malecca Hicks CMA ?Clinical Pharmacist Assistant ?[phone] ? ?

## 2021-06-05 NOTE — Telephone Encounter (Signed)
Patient taking Duloxetine '60mg'$  BID per PCP note but medslist and fill Hx show QD. Called patient, she stated taking it twice a day made her sleepy so she is only doing QD. Also, once she stopped the Fenofibrate, her pain went away and she doesn't need the pain benefit of '120mg'$ /day.  ?

## 2021-06-06 ENCOUNTER — Ambulatory Visit (INDEPENDENT_AMBULATORY_CARE_PROVIDER_SITE_OTHER): Payer: Medicare HMO | Admitting: Family Medicine

## 2021-06-06 VITALS — BP 138/80 | HR 67 | Resp 18 | Ht 62.0 in | Wt 196.8 lb

## 2021-06-06 DIAGNOSIS — Z1231 Encounter for screening mammogram for malignant neoplasm of breast: Secondary | ICD-10-CM

## 2021-06-06 DIAGNOSIS — Z0000 Encounter for general adult medical examination without abnormal findings: Secondary | ICD-10-CM | POA: Diagnosis not present

## 2021-06-06 NOTE — Patient Instructions (Addendum)
Nurse Notes:  ?1- Screening Mammogram scheduled for July 27 ?2- Colonoscopy due - call to schedule or we can make a referral when you are ready ?3- Pap smear due - call GYN to schedule ?4- COVID Booster due ?5- Shingrix Vaccine due - can get this at most pharmacies ?6- Diabetic Eye Exam due in August ?7- Bring a copy of your advance directives to the office for your medical record ?8- Continue monitoring BP - home readings given to Dr Tobie Poet ? ?Health Maintenance  ?Topic Date Due  ? PAP SMEAR 01/22/2016  ? COLONOSCOPY (Pts 45-53yr Insurance coverage will need to be confirmed)  05/14/2016  ? COVID-19 Vaccine (3 - Moderna risk series) 09/20/2019  ? Zoster Vaccines- Shingrix (1 of 2) 07/04/2021   ? INFLUENZA VACCINE  08/21/2021  ? OPHTHALMOLOGY EXAM  08/31/2021  ? HEMOGLOBIN A1C  10/04/2021  ? FOOT EXAM  04/04/2022  ? MAMMOGRAM  08/14/2021  ? TETANUS/TDAP  08/28/2025  ?  ?~~~~~~~~~~~~~~~~~~~~~~~~~~~ ?Health Maintenance, Female ?Adopting a healthy lifestyle and getting preventive care are important in promoting health and wellness. Ask your health care provider about: ?The right schedule for you to have regular tests and exams. ?Things you can do on your own to prevent diseases and keep yourself healthy. ?What should I know about diet, weight, and exercise? ?Eat a healthy diet ? ?Eat a diet that includes plenty of vegetables, fruits, low-fat dairy products, and lean protein. ?Do not eat a lot of foods that are high in solid fats, added sugars, or sodium. ?Maintain a healthy weight ?Body mass index (BMI) is used to identify weight problems. It estimates body fat based on height and weight. Your health care provider can help determine your BMI and help you achieve or maintain a healthy weight. ?Get regular exercise ?Get regular exercise. This is one of the most important things you can do for your health. Most adults should: ?Exercise for at least 150 minutes each week. The exercise should increase your heart rate and make  you sweat (moderate-intensity exercise). ?Do strengthening exercises at least twice a week. This is in addition to the moderate-intensity exercise. ?Spend less time sitting. Even light physical activity can be beneficial. ?Watch cholesterol and blood lipids ?Have your blood tested for lipids and cholesterol at 64years of age, then have this test every 5 years. ?Have your cholesterol levels checked more often if: ?Your lipid or cholesterol levels are high. ?You are older than 64years of age. ?You are at high risk for heart disease. ?What should I know about cancer screening? ?Depending on your health history and family history, you may need to have cancer screening at various ages. This may include screening for: ?Breast cancer. ?Cervical cancer. ?Colorectal cancer. ?Skin cancer. ?Lung cancer. ?What should I know about heart disease, diabetes, and high blood pressure? ?Blood pressure and heart disease ?High blood pressure causes heart disease and increases the risk of stroke. This is more likely to develop in people who have high blood pressure readings or are overweight. ?Have your blood pressure checked: ?Every 3-5 years if you are 192328years of age. ?Every year if you are 433years old or older. ?Diabetes ?Have regular diabetes screenings. This checks your fasting blood sugar level. Have the screening done: ?Once every three years after age 2382if you are at a normal weight and have a low risk for diabetes. ?More often and at a younger age if you are overweight or have a high risk for diabetes. ?What should  I know about preventing infection? ?Hepatitis B ?If you have a higher risk for hepatitis B, you should be screened for this virus. Talk with your health care provider to find out if you are at risk for hepatitis B infection. ?Hepatitis C ?Testing is recommended for: ?Everyone born from [birth year] through 1963. ?Anyone with known risk factors for hepatitis C. ?Sexually transmitted infections (STIs) ?Get screened for  STIs, including gonorrhea and chlamydia, if: ?You are sexually active and are younger than 64 years of age. ?You are older than 64 years of age and your health care provider tells you that you are at risk for this type of infection. ?Your sexual activity has changed since you were last screened, and you are at increased risk for chlamydia or gonorrhea. Ask your health care provider if you are at risk. ?Ask your health care provider about whether you are at high risk for HIV. Your health care provider may recommend a prescription medicine to help prevent HIV infection. If you choose to take medicine to prevent HIV, you should first get tested for HIV. You should then be tested every 3 months for as long as you are taking the medicine. ?Pregnancy ?If you are about to stop having your period (premenopausal) and you may become pregnant, seek counseling before you get pregnant. ?Take 400 to 800 micrograms (mcg) of folic acid every day if you become pregnant. ?Ask for birth control (contraception) if you want to prevent pregnancy. ?Osteoporosis and menopause ?Osteoporosis is a disease in which the bones lose minerals and strength with aging. This can result in bone fractures. If you are 46 years old or older, or if you are at risk for osteoporosis and fractures, ask your health care provider if you should: ?Be screened for bone loss. ?Take a calcium or vitamin D supplement to lower your risk of fractures. ?Be given hormone replacement therapy (HRT) to treat symptoms of menopause. ?Follow these instructions at home: ?Alcohol use ?Do not drink alcohol if: ?Your health care provider tells you not to drink. ?You are pregnant, may be pregnant, or are planning to become pregnant. ?If you drink alcohol: ?Limit how much you have to: ?0-1 drink a day. ?Know how much alcohol is in your drink. In the [place] one drink equals one 12 oz bottle of beer (355 mL), one 5 oz glass of wine (148 mL), or one 1? oz glass of hard liquor (44  mL). ?Lifestyle ?Do not use any products that contain nicotine or tobacco. These products include cigarettes, chewing tobacco, and vaping devices, such as e-cigarettes. If you need help quitting, ask your health care provider. ?Do not use street drugs. ?Do not share needles. ?Ask your health care provider for help if you need support or information about quitting drugs. ?General instructions ?Schedule regular health, dental, and eye exams. ?Stay current with your vaccines. ?Tell your health care provider if: ?You often feel depressed. ?You have ever been abused or do not feel safe at home. ?Summary ?Adopting a healthy lifestyle and getting preventive care are important in promoting health and wellness. ?Follow your health care provider's instructions about healthy diet, exercising, and getting tested or screened for diseases. ?Follow your health care provider's instructions on monitoring your cholesterol and blood pressure. ?This information is not intended to replace advice given to you by your health care provider. Make sure you discuss any questions you have with your health care provider. ?Document Revised: 05/29/2020 Document Reviewed: 05/29/2020 ?Elsevier Patient Education ? [place]. ? ?

## 2021-06-06 NOTE — Progress Notes (Signed)
Subjective:   Andrea White is a 66 y.o. female who presents for Medicare Annual (Subsequent) preventive examination.  This wellness visit is conducted by a nurse.  The patient's medications were reviewed and reconciled since the patient's last visit.  History details were provided by the patient.  The history appears to be reliable.    Medical History: Patient history and Family history was reviewed  Medications, Allergies, and preventative health maintenance was reviewed and updated.  Cardiac Risk Factors include: diabetes mellitus;dyslipidemia;hypertension;obesity (BMI >30kg/m2)     Objective:    Today's Vitals   06/06/21 1001 06/06/21 1431  BP:  138/80  Pulse:  67  Resp:  18  SpO2:  95%  Weight:  196 lb 12.8 oz (89.3 kg)  Height:  5' 2" (1.575 m)  PainSc: 0-No pain 0-No pain   Body mass index is 36 kg/m.     05/30/2020    9:28 AM 05/26/2019    9:03 AM 09/25/2017   12:24 PM 06/06/2016   10:55 AM  Advanced Directives  Does Patient Have a Medical Advance Directive? Yes Yes No No  Type of Paramedic of [place];Living will [place];Living will    Does patient want to make changes to medical advance directive? No - Patient declined No - Patient declined    Copy of [place] in Chart? No - copy requested No - copy requested    Would patient like information on creating a medical advance directive?   No - Patient declined No - Patient declined    Current Medications (verified) Outpatient Encounter Medications as of 06/06/2021  Medication Sig   albuterol (VENTOLIN HFA) 108 (90 Base) MCG/ACT inhaler INHALE 2 PUFFS BY MOUTH 4 TIMES DAILY AS NEEDED FOR WHEEZING   ALPRAZolam (XANAX) 0.5 MG tablet TAKE 1/2 TO 1 TABLET BY MOUTH EVERY 8 HOURS AS NEEDED.   APPLE CIDER VINEGAR PO Take by mouth daily.   Biotin 10000 MCG TABS Take 1 tablet by mouth daily.   Blood Glucose Monitoring Suppl (TRUE METRIX METER) w/Device KIT USE  AS DIRECTED   diclofenac Sodium (VOLTAREN) 1 % GEL Apply 4 g topically 4 (four) times daily as needed (knees, foot, or hand pain).   DULoxetine (CYMBALTA) 60 MG capsule Take 1 capsule (60 mg total) by mouth daily.   loratadine (CLARITIN) 10 MG tablet Take 1 tablet (10 mg total) by mouth daily.   meclizine (ANTIVERT) 25 MG tablet Take 1 tablet (25 mg total) by mouth 2 (two) times daily.   Multiple Vitamin (MULTIVITAMIN) tablet Take 1 tablet by mouth daily.   omeprazole (PRILOSEC) 40 MG capsule Take 1 capsule (40 mg total) by mouth 2 (two) times daily.   oxybutynin (DITROPAN) 5 MG tablet Take 1 tablet (5 mg total) by mouth 3 (three) times daily.   pravastatin (PRAVACHOL) 20 MG tablet Take 1 tablet (20 mg total) by mouth daily.   traMADol (ULTRAM) 50 MG tablet Take 1 tablet (50 mg total) by mouth 3 (three) times daily as needed.   TRUE METRIX BLOOD GLUCOSE TEST test strip USE AS DIRECTED TO CHECK FASTING BLOOD SUGAR.   TRUEplus Lancets 33G MISC E11.69 use new lancet each time when checking FBS   valsartan (DIOVAN) 80 MG tablet Take 1 tablet (80 mg total) by mouth daily.   No facility-administered encounter medications on file as of 06/06/2021.    Allergies (verified) Morphine and related, Celebrex [celecoxib], Fenofibrate, Gabapentin, Lisinopril, Guaifenesin & derivatives, Lipitor [  atorvastatin], Sulfa antibiotics, and Valium [diazepam]   History: Past Medical History:  Diagnosis Date   Allergy    Anxiety    Arthritis    Bradycardia, unspecified    Bruises easily    Calf pain    when walking   Fibromyalgia    Frequent headaches    GERD (gastroesophageal reflux disease)    H/O bladder problems    Hypertension    IBS (irritable bowel syndrome)    Localized edema    Mild persistent asthma, uncomplicated    Morbid (severe) obesity due to excess calories (HCC)    Muscle pain    Myocardial infarction (HCC)    Other hypotension    Ovarian cyst    Slow transit constipation    Solitary  pulmonary nodule    Swelling    feet and legs   Type 2 diabetes mellitus with diabetic nephropathy (HCC)    Varicose veins of bilateral lower extremities with pain    Past Surgical History:  Procedure Laterality Date   BUNIONECTOMY     left foot 5th toe   CARDIAC CATHETERIZATION     carpel tunnel surgery     CESAREAN SECTION     CHOLECYSTECTOMY     GALLBLADDER SURGERY     TONSILLECTOMY     TUBAL LIGATION     VAGINAL HYSTERECTOMY  2003   LAVH/SR   Family History  Problem Relation Age of Onset   Heart disease Father    Hypertension Father    Stroke Father    Heart attack Father    Cerebrovascular Accident Father    Diabetes Sister    Kidney disease Sister    Migraines Daughter    Breast cancer Maternal Aunt    Cancer Maternal Aunt 74       breast   Social History   Socioeconomic History   Marital status: Married    Spouse name: Not on file   Number of children: 1   Years of education: Not on file   Highest education level: Not on file  Occupational History   Occupation: Disabled due to foot problems since 1993  Tobacco Use   Smoking status: Never   Smokeless tobacco: Never  Vaping Use   Vaping Use: Never used  Substance and Sexual Activity   Alcohol use: No   Drug use: No   Sexual activity: Yes    Birth control/protection: Surgical    Comment: hyst  Other Topics Concern   Not on file  Social History Narrative   Not on file   Social Determinants of Health   Financial Resource Strain: Not on file  Food Insecurity: No Food Insecurity   Worried About Estate manager/land agent of Food in the Last Year: Never true   Ran Out of Food in the Last Year: Never true  Transportation Needs: No Transportation Needs   Lack of Transportation (Medical): No   Lack of Transportation (Non-Medical): No  Physical Activity: Not on file  Stress: No Stress Concern Present   Feeling of Stress : Only a little  Social Connections: Not on file    Tobacco Counseling Counseling given: Not  Answered   Clinical Intake:  Pre-visit preparation completed: Yes Pain : No/denies pain Pain Score: 0-No pain   BMI - recorded: 36 Nutritional Status: BMI > 30  Obese Nutritional Risks: None Diabetes: Yes (A1C 6.5) CBG done?: No How often do you need to have someone help you when you read instructions, pamphlets, or other written materials  from your doctor or pharmacy?: 1 - Never Interpreter Needed?: No    Activities of Daily Living    06/06/2021    3:31 PM  In your present state of health, do you have any difficulty performing the following activities:  Hearing? 0  Vision? 0  Difficulty concentrating or making decisions? 0  Walking or climbing stairs? 0  Dressing or bathing? 0  Doing errands, shopping? 0  Preparing Food and eating ? N  Using the Toilet? N  In the past six months, have you accidently leaked urine? N  Do you have problems with loss of bowel control? N  Managing your Medications? N  Managing your Finances? N  Housekeeping or managing your Housekeeping? N    Patient Care Team: [name], MD as PCP - General (Family Medicine) Joya Salm, MD as Referring Physician (Orthopedic Surgery) Landis Martins, DPM as Consulting Physician (Podiatry) Delsa Bern, MD as Consulting Physician (Obstetrics and Gynecology) Lynnell Dike, OD (Optometry) Starling Manns, MD (Orthopedic Surgery)     Assessment:   This is a routine wellness examination for Andrea White.  Hearing/Vision screen No results found.  Dietary issues and exercise activities discussed: Current Exercise Habits: Home exercise routine, Type of exercise: walking (active outside), Time (Minutes): 20, Frequency (Times/Week): 4, Weekly Exercise (Minutes/Week): 80, Intensity: Mild, Exercise limited by: None identified   Goals Addressed             This Visit's Progress    Obtain Annual Eye (retinal)  Exam        Yearly, DUE 08/2021       Depression Screen    06/06/2021    3:26 PM  04/03/2021    8:34 AM 10/04/2020    7:56 AM 09/05/2020    1:38 PM 05/30/2020    9:29 AM 04/18/2020   11:25 AM 10/29/2019    8:39 AM  PHQ 2/9 Scores  PHQ - 2 Score 0 0 0 0 0 0 0  PHQ- 9 Score    0 0 0 0    Fall Risk    06/06/2021    3:31 PM 04/03/2021    8:34 AM 10/04/2020    7:56 AM 09/05/2020    1:37 PM 05/30/2020    9:09 AM  [place] in the past year? 0 0 0 0 1  Number falls in past yr: 0 0 0 0 0  Injury with Fall? 0 0 0 0 1  Risk for fall due to : No Fall Risks  No Fall Risks No Fall Risks Impaired balance/gait  Follow up Falls evaluation completed;Education provided Falls evaluation completed Falls evaluation completed Falls evaluation completed Falls evaluation completed;Falls prevention discussed    FALL RISK PREVENTION PERTAINING TO THE HOME:  Home free of loose throw rugs in walkways, pet beds, electrical cords, etc? Yes  Adequate lighting in your home to reduce risk of falls? Yes   ASSISTIVE DEVICES UTILIZED TO PREVENT FALLS:  Life alert? No  Use of a cane, walker or w/c? No  Gait steady and fast without use of assistive device  Cognitive Function:        06/06/2021    3:49 PM 05/30/2020    9:31 AM 05/26/2019    9:49 AM  6CIT Screen  What Year? 0 points 0 points 0 points  What month? 0 points 0 points 0 points  What time? 0 points 0 points 0 points  Count back from 20 0 points 0 points 0 points  Months in reverse 0 points 0 points 0 points  Repeat phrase 0 points 0 points 0 points  Total Score 0 points 0 points 0 points    Immunizations Immunization History  Administered Date(s) Administered   Influenza Inj Mdck Quad Pf 10/29/2019, 12/12/2020   Influenza-Unspecified 11/03/2017, 09/25/2018   Moderna Sars-Covid-2 Vaccination 07/27/2019, 08/23/2019   Pneumococcal Polysaccharide-23 11/06/2015   Tdap 08/29/2015    TDAP status: Up to date  Flu Vaccine status: Up to date  Pneumococcal vaccine status: Up to date  Covid-19 vaccine status: Information  provided on how to obtain vaccines.   Qualifies for Shingles Vaccine? Yes   Zostavax completed No   Shingrix Completed?: No.    Education has been provided regarding the importance of this vaccine. Patient has been advised to call insurance company to determine out of pocket expense if they have not yet received this vaccine. Advised may also receive vaccine at local pharmacy or Health Dept. Verbalized acceptance and understanding.  Screening Tests Health Maintenance  Topic Date Due   HIV Screening  Never done   Hepatitis C Screening  Never done   PAP SMEAR-Modifier  01/22/2016   COLONOSCOPY (Pts 45-40yr Insurance coverage will need to be confirmed)  05/14/2016   COVID-19 Vaccine (3 - Moderna risk series) 09/20/2019   Zoster Vaccines- Shingrix (1 of 2) 07/04/2021 (Originally 11/20/1974)   INFLUENZA VACCINE  08/21/2021   OPHTHALMOLOGY EXAM  08/31/2021   HEMOGLOBIN A1C  10/04/2021   FOOT EXAM  04/04/2022   MAMMOGRAM  08/15/2022   TETANUS/TDAP  08/28/2025   HPV VACCINES  Aged Out    Health Maintenance  Health Maintenance Due  Topic Date Due   HIV Screening  Never done   Hepatitis C Screening  Never done   PAP SMEAR-Modifier  01/22/2016   COLONOSCOPY (Pts 45-484yrInsurance coverage will need to be confirmed)  05/14/2016   COVID-19 Vaccine (3 - Moderna risk series) 09/20/2019    Colorectal cancer screening: Type of screening: Colonoscopy. Patient is due and will consider making an appointment later this year  Mammogram status: Ordered today, scheduled for July on the mobile mammo bus  Bone Density status: Completed 06/18/11. Results reflect: Bone density results: NORMAL.   Lung Cancer Screening: (Low Dose CT Chest recommended if Age 64-80ears, 30 pack-year currently smoking OR have quit w/in 15years.) does not qualify.   Additional Screening:  Vision Screening: Recommended annual ophthalmology exams for early detection of glaucoma and other disorders of the eye. Is the  patient up to date with their annual eye exam?  Yes  Who is the provider or what is the name of the office in which the patient attends annual eye exams? Dr SnRenaldo FiddlerDental Screening: Recommended annual dental exams for proper oral hygiene    Plan:    1- Screening Mammogram scheduled for July 27 2- Colonoscopy due - call to schedule or we can make a referral when you are ready 3- Pap smear due - call GYN to schedule 4- COVID Booster due 5- Shingrix Vaccine due - can get this at most pharmacies 6- Diabetic Eye Exam due in August 7- Bring a copy of your advance directives to the office for your medical record 8- Continue monitoring BP - home readings given to Dr CoTobie Poet  I have personally reviewed and noted the following in the patient's chart:   Medical and social history Use of alcohol, tobacco or illicit drugs  Current medications and supplements including opioid prescriptions.  Functional ability and status Nutritional status Physical activity Advanced directives List of other physicians Hospitalizations, surgeries, and ER visits in previous 12 months Vitals Screenings to include cognitive, depression, and falls Referrals and appointments  In addition, I have reviewed and discussed with patient certain preventive protocols, quality metrics, and best practice recommendations. A written personalized care plan for preventive services as well as general preventive health recommendations were provided to patient.     Erie Noe, LPN   [id number]

## 2021-06-15 ENCOUNTER — Telehealth: Payer: Self-pay

## 2021-06-15 NOTE — Telephone Encounter (Signed)
Returned patient's phonecall, confirmed we have CMN on file and that shoes and insoles have been ordered. All questions answered and concerns addressed.

## 2021-06-21 ENCOUNTER — Other Ambulatory Visit: Payer: Self-pay | Admitting: Family Medicine

## 2021-06-21 ENCOUNTER — Telehealth: Payer: Self-pay

## 2021-06-21 MED ORDER — EMPAGLIFLOZIN 10 MG PO TABS: 10.0000 mg | ORAL_TABLET | Freq: Every day | ORAL | 0 refills | Status: DC

## 2021-06-21 NOTE — Telephone Encounter (Signed)
Made patient aware. She will stop by later for Jardiance 10 mg samples.   Royce Macadamia, [place] 06/21/21 2:14 PM

## 2021-06-21 NOTE — Telephone Encounter (Signed)
Patient left VM she is concerned fasting BG has been ranging 140-170. She has been under stress recently, but feels she has been controlling this. She is unsure if metformin should be restarted.   Callback: [phone] Please advise.  Royce Macadamia, [place] 06/21/21 10:00 AM

## 2021-06-27 ENCOUNTER — Other Ambulatory Visit: Payer: Self-pay | Admitting: Legal Medicine

## 2021-07-02 ENCOUNTER — Encounter: Payer: Self-pay | Admitting: Podiatry

## 2021-07-02 ENCOUNTER — Ambulatory Visit (INDEPENDENT_AMBULATORY_CARE_PROVIDER_SITE_OTHER): Payer: Medicare HMO | Admitting: Podiatry

## 2021-07-02 DIAGNOSIS — B351 Tinea unguium: Secondary | ICD-10-CM

## 2021-07-02 DIAGNOSIS — E1142 Type 2 diabetes mellitus with diabetic polyneuropathy: Secondary | ICD-10-CM

## 2021-07-02 DIAGNOSIS — M79674 Pain in right toe(s): Secondary | ICD-10-CM | POA: Diagnosis not present

## 2021-07-02 DIAGNOSIS — M79675 Pain in left toe(s): Secondary | ICD-10-CM

## 2021-07-02 NOTE — Progress Notes (Signed)
Subjective: Andrea White is a 66 y.o. female patient who returns to office for diabetic nail care.  Patient reports that it is time for new diabetic shoes. Cox, Kirsten, MD, PCP Fasting blood sugar not recorded Last A1c 6.5  Patient Active Problem List   Diagnosis Date Noted   Hypertensive heart and kidney disease with acute combined systolic and diastolic congestive heart failure and stage 3b chronic kidney disease ([place]) 05/15/2021   Hypertension associated with stage 3b chronic kidney disease due to type 2 diabetes mellitus ([place]) 05/15/2021   Vertigo 05/15/2021   Chronic midline low back pain without sciatica 05/15/2021   Allergic rhinitis due to allergen 04/22/2021   Screen for colon cancer 04/22/2021   Hx of adenomatous colonic polyps 09/12/2020    Class: History of   Diabetic polyneuropathy associated with type 2 diabetes mellitus ([place]) 04/30/2019   Hypertension associated with diabetes ([place]) 04/30/2019   Mixed hyperlipidemia 04/30/2019   Mixed diabetic hyperlipidemia associated with type 2 diabetes mellitus ([place]) 04/30/2019   Major depressive disorder, single episode, mild ([place]) 04/30/2019   Leukocytosis 04/17/2018    Class: Minor   Thrombocytosis, unspecified 04/17/2018    Class: Diagnosis of   Myocardial infarction ([place]) 10/31/2017   Primary fibromyalgia syndrome 10/31/2017   Gastroesophageal reflux disease 03/15/2015   Asthma 03/15/2015   Severe obesity with body mass index (BMI) of 36.0 to 36.9 with serious comorbidity ([place]) 03/15/2015   Herniated disc 06/10/2011   History of heart attack 06/10/2011   Cyst of ovary     Current Outpatient Medications on File Prior to Visit  Medication Sig Dispense Refill   albuterol (VENTOLIN HFA) 108 (90 Base) MCG/ACT inhaler INHALE 2 PUFFS BY MOUTH 4 TIMES DAILY AS NEEDED FOR WHEEZING 9 g 3   ALPRAZolam (XANAX) 0.5 MG tablet TAKE 1/2 TO 1 TABLET BY MOUTH EVERY 8 HOURS AS NEEDED. 30 tablet 5   APPLE CIDER VINEGAR PO Take by mouth  daily.     Biotin 10000 MCG TABS Take 1 tablet by mouth daily.     Blood Glucose Monitoring Suppl (TRUE METRIX METER) w/Device KIT USE AS DIRECTED 1 kit 0   diclofenac Sodium (VOLTAREN) 1 % GEL Apply 4 g topically 4 (four) times daily as needed (knees, foot, or hand pain). 1000 g 3   DULoxetine (CYMBALTA) 60 MG capsule Take 1 capsule (60 mg total) by mouth daily. 90 capsule 1   empagliflozin (JARDIANCE) 10 MG TABS tablet Take 1 tablet (10 mg total) by mouth daily before breakfast. 28 tablet 0   fenofibrate 160 MG tablet Take 160 mg by mouth daily.     loratadine (CLARITIN) 10 MG tablet Take 1 tablet (10 mg total) by mouth daily. 90 tablet 3   meclizine (ANTIVERT) 25 MG tablet Take 1 tablet (25 mg total) by mouth 2 (two) times daily. 180 tablet 1   Multiple Vitamin (MULTIVITAMIN) tablet Take 1 tablet by mouth daily.     omeprazole (PRILOSEC) 40 MG capsule Take 1 capsule (40 mg total) by mouth 2 (two) times daily. 180 capsule 1   oxybutynin (DITROPAN) 5 MG tablet Take 1 tablet (5 mg total) by mouth 3 (three) times daily. 270 tablet 0   pravastatin (PRAVACHOL) 20 MG tablet Take 1 tablet (20 mg total) by mouth daily. 90 tablet 0   spironolactone-hydrochlorothiazide (ALDACTAZIDE) 25-25 MG tablet Take 1 tablet by mouth daily.     traMADol (ULTRAM) 50 MG tablet Take 1 tablet (50 mg total) by mouth 3 (three)  times daily as needed. 90 tablet 1   TRUE METRIX BLOOD GLUCOSE TEST test strip USE AS DIRECTED TO CHECK FASTING BLOOD SUGAR. 300 strip 2   TRUEplus Lancets 33G MISC E11.69 use new lancet each time when checking FBS 100 each 2   valsartan (DIOVAN) 80 MG tablet Take 1 tablet (80 mg total) by mouth daily. 90 tablet 3   No current facility-administered medications on file prior to visit.    Allergies  Allergen Reactions   Morphine And Related Other (See Comments)    Woozy, "out of body experience"   Celebrex [Celecoxib] Other (See Comments)    Patient does not remember intolerance   Fenofibrate      Vertigo    Gabapentin Other (See Comments)    Confusion   Lisinopril Cough   Guaifenesin & Derivatives Other (See Comments)    Patient cannot remember specific issue, just made her feel "weird"   Lipitor [Atorvastatin] Rash   Sulfa Antibiotics Rash   Valium [Diazepam] Other (See Comments)    Lightheaded, dizziness     Objective:  General: Alert and oriented x3 in no acute distress  Dermatology: Nails x10 elongated thickened dystrophic consistent with onychomycosis.    Vascular: Dorsalis Pedis and Posterior Tibial pedal pulses palpable, Capillary Fill Time 5 seconds,(+) pedal hair growth bilateral, no edema bilateral lower extremities, Temperature gradient within normal limits.  Neurology: Johney Maine sensation intact via light touch bilateral.  Musculoskeletal: + pes planus foot type, bunion and hammertoe deformity that is currently asymptomatic.   Assessment and Plan: Problem List Items Addressed This Visit       Endocrine   Diabetic polyneuropathy associated with type 2 diabetes mellitus ([place]) - Primary   Other Visit Diagnoses     Pain due to onychomycosis of toenails of both feet           -Complete examination performed -Mechanically debrided nails x10 using a sterile nipper without incident except minor bleeding noted to the left fourth toe which was treated with Lumicain advised patient to monitor if worsens to return to office sooner -Continue with good supportive shoes daily for foot type until she can be remeasured for new diabetic shoes for the year -Patient to return to office for in 3 months for nail care and scheduled for diabetic shoe measurements or sooner if condition worsens.    Lorenda Peck, DPM

## 2021-07-04 ENCOUNTER — Ambulatory Visit: Payer: Medicare HMO | Admitting: Sports Medicine

## 2021-07-26 ENCOUNTER — Other Ambulatory Visit: Payer: Self-pay

## 2021-07-26 MED ORDER — EMPAGLIFLOZIN 10 MG PO TABS: 10.0000 mg | ORAL_TABLET | Freq: Every day | ORAL | 1 refills | Status: DC

## 2021-07-30 ENCOUNTER — Other Ambulatory Visit: Payer: Self-pay

## 2021-07-30 ENCOUNTER — Telehealth: Payer: Self-pay

## 2021-07-30 MED ORDER — HYDROCHLOROTHIAZIDE 12.5 MG PO CAPS: 12.5000 mg | ORAL_CAPSULE | Freq: Every day | ORAL | 1 refills | Status: DC

## 2021-07-30 NOTE — Telephone Encounter (Signed)
Patient was informed.  She would gladly take the jardiance if she qualifies for patient assistance.  She request hydrochlorothiazide be sent to [place] [facility]

## 2021-07-30 NOTE — Telephone Encounter (Signed)
Andrea White called to let us know that she is unable to afford the jardiance (her cost is $125).  She also has been having increased swelling over the last 2 weeks since her recent medication change.  She reports no increased shortness of breath.  What do you recommend?

## 2021-08-02 ENCOUNTER — Telehealth: Payer: Self-pay

## 2021-08-02 NOTE — Progress Notes (Signed)
    Chronic Care Management Pharmacy Assistant   Name: Andrea White  MRN: [id number] DOB: 1955-09-21   Reason for Encounter: Jardiance 3m PAP started and uploaded. Will be mailed out to pt within a week or so. Pt has been informed and stated she had already printed the application out because she didn't want to wait for the application and will fill out and take to Dr. CTobie Poetto sign and fax.     Medications: Outpatient Encounter Medications as of 08/02/2021  Medication Sig   albuterol (VENTOLIN HFA) 108 (90 Base) MCG/ACT inhaler INHALE 2 PUFFS BY MOUTH 4 TIMES DAILY AS NEEDED FOR WHEEZING   ALPRAZolam (XANAX) 0.5 MG tablet TAKE 1/2 TO 1 TABLET BY MOUTH EVERY 8 HOURS AS NEEDED.   APPLE CIDER VINEGAR PO Take by mouth daily.   Biotin 10000 MCG TABS Take 1 tablet by mouth daily.   Blood Glucose Monitoring Suppl (TRUE METRIX METER) w/Device KIT USE AS DIRECTED   diclofenac Sodium (VOLTAREN) 1 % GEL Apply 4 g topically 4 (four) times daily as needed (knees, foot, or hand pain).   DULoxetine (CYMBALTA) 60 MG capsule Take 1 capsule (60 mg total) by mouth daily.   empagliflozin (JARDIANCE) 10 MG TABS tablet Take 1 tablet (10 mg total) by mouth daily before breakfast.   fenofibrate 160 MG tablet Take 160 mg by mouth daily.   hydrochlorothiazide (MICROZIDE) 12.5 MG capsule Take 1 capsule (12.5 mg total) by mouth daily.   loratadine (CLARITIN) 10 MG tablet Take 1 tablet (10 mg total) by mouth daily.   meclizine (ANTIVERT) 25 MG tablet Take 1 tablet (25 mg total) by mouth 2 (two) times daily.   Multiple Vitamin (MULTIVITAMIN) tablet Take 1 tablet by mouth daily.   omeprazole (PRILOSEC) 40 MG capsule Take 1 capsule (40 mg total) by mouth 2 (two) times daily.   oxybutynin (DITROPAN) 5 MG tablet Take 1 tablet (5 mg total) by mouth 3 (three) times daily.   pravastatin (PRAVACHOL) 20 MG tablet Take 1 tablet (20 mg total) by mouth daily.   traMADol (ULTRAM) 50 MG tablet Take 1 tablet (50 mg total) by  mouth 3 (three) times daily as needed.   TRUE METRIX BLOOD GLUCOSE TEST test strip USE AS DIRECTED TO CHECK FASTING BLOOD SUGAR.   TRUEplus Lancets 33G MISC E11.69 use new lancet each time when checking FBS   valsartan (DIOVAN) 80 MG tablet Take 1 tablet (80 mg total) by mouth daily.   No facility-administered encounter medications on file as of 08/02/2021.    [name] CHoopaPharmacist Assistant  [phone]

## 2021-08-07 DIAGNOSIS — N179 Acute kidney failure, unspecified: Secondary | ICD-10-CM | POA: Diagnosis not present

## 2021-08-07 DIAGNOSIS — N1832 Chronic kidney disease, stage 3b: Secondary | ICD-10-CM | POA: Diagnosis not present

## 2021-08-07 DIAGNOSIS — I129 Hypertensive chronic kidney disease with stage 1 through stage 4 chronic kidney disease, or unspecified chronic kidney disease: Secondary | ICD-10-CM | POA: Diagnosis not present

## 2021-08-07 DIAGNOSIS — E1122 Type 2 diabetes mellitus with diabetic chronic kidney disease: Secondary | ICD-10-CM | POA: Diagnosis not present

## 2021-08-07 DIAGNOSIS — N1831 Chronic kidney disease, stage 3a: Secondary | ICD-10-CM | POA: Diagnosis not present

## 2021-08-08 ENCOUNTER — Other Ambulatory Visit: Payer: Self-pay | Admitting: Nephrology

## 2021-08-08 DIAGNOSIS — N179 Acute kidney failure, unspecified: Secondary | ICD-10-CM

## 2021-08-08 DIAGNOSIS — N1831 Chronic kidney disease, stage 3a: Secondary | ICD-10-CM

## 2021-08-09 ENCOUNTER — Telehealth: Payer: Self-pay

## 2021-08-09 NOTE — Chronic Care Management (AMB) (Signed)
Faxing [employer] application for Jardiance 10 mg.  Pattricia Boss, [place] Pharmacist Assistant [phone]

## 2021-08-13 NOTE — Telephone Encounter (Signed)
Thank you for the notification Maudie Mercury, we will contact the patient to discuss [place].   Pattricia Boss, [place] Pharmacist Assistant [phone]

## 2021-08-13 NOTE — Telephone Encounter (Signed)
Received fax from [facility] Patient Assistance Program stating:

## 2021-08-14 ENCOUNTER — Telehealth: Payer: Self-pay

## 2021-08-14 NOTE — Progress Notes (Signed)
    Chronic Care Management Pharmacy Assistant   Name: FIORA WEILL  MRN: [id number] DOB: 1955/04/22   Reason for Encounter: Patient Assistance Documentation   Mrs. Alesa and I applied for medication assistance through the social security website together and will wait to get determination.  "The Application For Extra Help With Medicare Prescription Drug Plan Costs was received by Social Security on August 14, 2021, 11:43:32 AM"    Medications: Outpatient Encounter Medications as of 08/14/2021  Medication Sig   albuterol (VENTOLIN HFA) 108 (90 Base) MCG/ACT inhaler INHALE 2 PUFFS BY MOUTH 4 TIMES DAILY AS NEEDED FOR WHEEZING   ALPRAZolam (XANAX) 0.5 MG tablet TAKE 1/2 TO 1 TABLET BY MOUTH EVERY 8 HOURS AS NEEDED.   APPLE CIDER VINEGAR PO Take by mouth daily.   Biotin 10000 MCG TABS Take 1 tablet by mouth daily.   Blood Glucose Monitoring Suppl (TRUE METRIX METER) w/Device KIT USE AS DIRECTED   diclofenac Sodium (VOLTAREN) 1 % GEL Apply 4 g topically 4 (four) times daily as needed (knees, foot, or hand pain).   DULoxetine (CYMBALTA) 60 MG capsule Take 1 capsule (60 mg total) by mouth daily.   empagliflozin (JARDIANCE) 10 MG TABS tablet Take 1 tablet (10 mg total) by mouth daily before breakfast.   fenofibrate 160 MG tablet Take 160 mg by mouth daily.   hydrochlorothiazide (MICROZIDE) 12.5 MG capsule Take 1 capsule (12.5 mg total) by mouth daily.   loratadine (CLARITIN) 10 MG tablet Take 1 tablet (10 mg total) by mouth daily.   meclizine (ANTIVERT) 25 MG tablet Take 1 tablet (25 mg total) by mouth 2 (two) times daily.   Multiple Vitamin (MULTIVITAMIN) tablet Take 1 tablet by mouth daily.   omeprazole (PRILOSEC) 40 MG capsule Take 1 capsule (40 mg total) by mouth 2 (two) times daily.   oxybutynin (DITROPAN) 5 MG tablet Take 1 tablet (5 mg total) by mouth 3 (three) times daily.   pravastatin (PRAVACHOL) 20 MG tablet Take 1 tablet (20 mg total) by mouth daily.   traMADol (ULTRAM) 50 MG  tablet Take 1 tablet (50 mg total) by mouth 3 (three) times daily as needed.   TRUE METRIX BLOOD GLUCOSE TEST test strip USE AS DIRECTED TO CHECK FASTING BLOOD SUGAR.   TRUEplus Lancets 33G MISC E11.69 use new lancet each time when checking FBS   valsartan (DIOVAN) 80 MG tablet Take 1 tablet (80 mg total) by mouth daily.   No facility-administered encounter medications on file as of 08/14/2021.    Elray Mcgregor, [place] Pharmacist Assistant  [phone]

## 2021-08-16 ENCOUNTER — Inpatient Hospital Stay: Admission: RE | Admit: 2021-08-16 | Payer: Medicare HMO | Source: Ambulatory Visit

## 2021-08-17 ENCOUNTER — Other Ambulatory Visit: Payer: Medicare HMO

## 2021-08-20 ENCOUNTER — Ambulatory Visit
Admission: RE | Admit: 2021-08-20 | Discharge: 2021-08-20 | Disposition: A | Discharge status: Home / Self Care | Payer: Medicare HMO | Source: Ambulatory Visit | Attending: Family Medicine | Admitting: Family Medicine

## 2021-08-20 DIAGNOSIS — Z1231 Encounter for screening mammogram for malignant neoplasm of breast: Secondary | ICD-10-CM | POA: Diagnosis not present

## 2021-08-21 DIAGNOSIS — M1812 Unilateral primary osteoarthritis of first carpometacarpal joint, left hand: Secondary | ICD-10-CM | POA: Diagnosis not present

## 2021-08-21 DIAGNOSIS — M65342 Trigger finger, left ring finger: Secondary | ICD-10-CM | POA: Diagnosis not present

## 2021-08-22 ENCOUNTER — Ambulatory Visit
Admission: RE | Admit: 2021-08-22 | Discharge: 2021-08-22 | Disposition: A | Discharge status: Home / Self Care | Payer: Medicare HMO | Source: Ambulatory Visit | Attending: Nephrology | Admitting: Nephrology

## 2021-08-22 DIAGNOSIS — N179 Acute kidney failure, unspecified: Secondary | ICD-10-CM

## 2021-08-22 DIAGNOSIS — N189 Chronic kidney disease, unspecified: Secondary | ICD-10-CM | POA: Diagnosis not present

## 2021-08-22 DIAGNOSIS — N281 Cyst of kidney, acquired: Secondary | ICD-10-CM | POA: Diagnosis not present

## 2021-08-22 DIAGNOSIS — N1831 Chronic kidney disease, stage 3a: Secondary | ICD-10-CM

## 2021-08-29 ENCOUNTER — Other Ambulatory Visit: Payer: Medicare HMO

## 2021-08-29 ENCOUNTER — Other Ambulatory Visit: Payer: Self-pay | Admitting: Family Medicine

## 2021-09-04 ENCOUNTER — Other Ambulatory Visit: Payer: Self-pay

## 2021-09-04 MED ORDER — EMPAGLIFLOZIN 10 MG PO TABS: 10.0000 mg | ORAL_TABLET | Freq: Every day | ORAL | 0 refills | Status: DC

## 2021-09-04 MED ORDER — HYDROCHLOROTHIAZIDE 12.5 MG PO CAPS: 12.5000 mg | ORAL_CAPSULE | Freq: Every day | ORAL | 0 refills | Status: DC

## 2021-09-06 DIAGNOSIS — J45909 Unspecified asthma, uncomplicated: Secondary | ICD-10-CM | POA: Diagnosis not present

## 2021-09-06 DIAGNOSIS — G8918 Other acute postprocedural pain: Secondary | ICD-10-CM | POA: Diagnosis not present

## 2021-09-06 DIAGNOSIS — I129 Hypertensive chronic kidney disease with stage 1 through stage 4 chronic kidney disease, or unspecified chronic kidney disease: Secondary | ICD-10-CM | POA: Diagnosis not present

## 2021-09-06 DIAGNOSIS — N183 Chronic kidney disease, stage 3 unspecified: Secondary | ICD-10-CM | POA: Diagnosis not present

## 2021-09-06 DIAGNOSIS — M1812 Unilateral primary osteoarthritis of first carpometacarpal joint, left hand: Secondary | ICD-10-CM | POA: Diagnosis not present

## 2021-09-06 DIAGNOSIS — Z7984 Long term (current) use of oral hypoglycemic drugs: Secondary | ICD-10-CM | POA: Diagnosis not present

## 2021-09-06 DIAGNOSIS — M65342 Trigger finger, left ring finger: Secondary | ICD-10-CM | POA: Diagnosis not present

## 2021-09-06 DIAGNOSIS — E1122 Type 2 diabetes mellitus with diabetic chronic kidney disease: Secondary | ICD-10-CM | POA: Diagnosis not present

## 2021-09-17 ENCOUNTER — Encounter: Payer: Self-pay | Admitting: Family Medicine

## 2021-09-17 DIAGNOSIS — H524 Presbyopia: Secondary | ICD-10-CM | POA: Diagnosis not present

## 2021-09-17 DIAGNOSIS — H35033 Hypertensive retinopathy, bilateral: Secondary | ICD-10-CM | POA: Diagnosis not present

## 2021-09-17 DIAGNOSIS — H25813 Combined forms of age-related cataract, bilateral: Secondary | ICD-10-CM | POA: Diagnosis not present

## 2021-09-17 DIAGNOSIS — I10 Essential (primary) hypertension: Secondary | ICD-10-CM | POA: Diagnosis not present

## 2021-09-17 DIAGNOSIS — H43823 Vitreomacular adhesion, bilateral: Secondary | ICD-10-CM | POA: Diagnosis not present

## 2021-09-17 DIAGNOSIS — E119 Type 2 diabetes mellitus without complications: Secondary | ICD-10-CM | POA: Diagnosis not present

## 2021-09-17 LAB — HM DIABETES EYE EXAM

## 2021-09-18 ENCOUNTER — Ambulatory Visit: Payer: Medicare HMO | Admitting: Podiatrist

## 2021-09-18 DIAGNOSIS — E1142 Type 2 diabetes mellitus with diabetic polyneuropathy: Secondary | ICD-10-CM

## 2021-09-18 NOTE — Progress Notes (Signed)
Reason for Visit:        Fitting and Delivery of [facility] Patient Report:            Patient reports comfort and is satisfied with device.   ACTIONS PERFORMED Patient was provided with verbal and written instruction and demonstration regarding  wear, care, proper fit, function, purpose, cleaning, and use of the orthosis and in all related precautions and risks and benefits regarding the orthosis.   Patient was also provided with verbal instruction regarding how to report any failures or malfunctions of the orthosis and necessary follow up care. Patient was also instructed to contact our office regarding any change in status that may affect the function of the orthosis.   Patient demonstrated independence with proper donning, doffing, and fit and verbalized understanding of all instructions.   PLAN: Patient is to follow up in one week or as necessary (PRN). All questions were answered and concerns addressed.

## 2021-09-19 ENCOUNTER — Telehealth: Payer: Self-pay | Admitting: Family Medicine

## 2021-09-19 ENCOUNTER — Other Ambulatory Visit: Payer: Self-pay

## 2021-09-19 ENCOUNTER — Other Ambulatory Visit: Payer: Medicare HMO

## 2021-09-19 DIAGNOSIS — M1812 Unilateral primary osteoarthritis of first carpometacarpal joint, left hand: Secondary | ICD-10-CM | POA: Diagnosis not present

## 2021-09-19 MED ORDER — ALBUTEROL SULFATE HFA 108 (90 BASE) MCG/ACT IN AERS: 2.0000 | INHALATION_SPRAY | Freq: Four times a day (QID) | RESPIRATORY_TRACT | 3 refills | Status: DC | PRN

## 2021-09-19 NOTE — Telephone Encounter (Signed)
PT DROPPED OFF A FORM FOR THERE. SHOES YESTERDAY 08.29.23 SHE WALKED IN TODAY ASKING IF THE FORM WAS DONE. I TOLD HER DR. COX WILL GET TO IT SOON THAT WE REQUEST 48-72 HRS. SHE SAID THAT HER SHOES ARE THERE READY TO BE PICKED UP AND THEIR CLOSED NEXT WEEK.

## 2021-09-19 NOTE — Telephone Encounter (Signed)
Done. Dr. Kinsey Karch  

## 2021-09-29 ENCOUNTER — Other Ambulatory Visit: Payer: Self-pay | Admitting: Family Medicine

## 2021-10-01 ENCOUNTER — Telehealth: Payer: Self-pay | Admitting: Podiatry

## 2021-10-01 NOTE — Telephone Encounter (Signed)
Spoke with pt and informed her that I still have no received that fax from her PCP. Explained to her that there may have been an issue with the incoming fax. Pt is calling to have them refax it to our office and she is also going to pick up a copy when she goes in on the 14th for her appt.

## 2021-10-03 NOTE — Progress Notes (Signed)
Subjective:  Patient ID: Andrea White, female    DOB: [birth year]  Age: 66 y.o. MRN: [id number]  Chief Complaint  Patient presents with   Diabetes   Hyperlipidemia   Hypertension   HPI: Diabetes:  Complications: neuropathy. Glucose checking: daily  Glucose logs: Range 120s Hypoglycemia: none Most recent A1C: 6.5 Current medications: Jardiance 10 mg once daily.  Foot checks: daily.  Hyperlipidemia: Current medications: Pravastatin 20 mg daily. Patient stopped fenofibrate because it caused vertigo.   Hypertension: Current medications: hydrochlorothiazide 12.5 mg once daily and Valsartan 80 mg daily.  GERD: on omeprazole 40 mg one oral twice daily.  Urge incontinence: oxybutynin 5 mg three times a day.  FM: on duloxetine 60 mg before bed.  Chronic Back Pain: Takes tramadol three times a day. Using diclofenac gel.  Allergic rhinitis: claritin. Asthma: on albuterol 2 puffs   Diet: eats what she wants, but this is fruits, vegetables, meat in the evening. Avoids fried foods and refined sugars.  Exercise:  Gardening.    Current Outpatient Medications on File Prior to Visit  Medication Sig Dispense Refill   albuterol (VENTOLIN HFA) 108 (90 Base) MCG/ACT inhaler Inhale 2 puffs into the lungs every 6 (six) hours as needed for wheezing or shortness of breath. 9 g 3   ALPRAZolam (XANAX) 0.5 MG tablet TAKE 1/2 TO 1 TABLET EVERY 8 HOURS AS NEEDED 30 tablet 2   APPLE CIDER VINEGAR PO Take by mouth daily.     Biotin 10000 MCG TABS Take 1 tablet by mouth daily.     Blood Glucose Monitoring Suppl (TRUE METRIX METER) w/Device KIT USE AS DIRECTED 1 kit 0   diclofenac Sodium (VOLTAREN) 1 % GEL Apply 4 g topically 4 (four) times daily as needed (knees, foot, or hand pain). 1000 g 3   DULoxetine (CYMBALTA) 60 MG capsule Take 1 capsule (60 mg total) by mouth daily. 90 capsule 1   empagliflozin (JARDIANCE) 10 MG TABS tablet Take 1 tablet (10 mg total) by mouth daily before breakfast. 90 tablet  0   hydrochlorothiazide (MICROZIDE) 12.5 MG capsule Take 1 capsule (12.5 mg total) by mouth daily. 90 capsule 0   loratadine (CLARITIN) 10 MG tablet Take 1 tablet (10 mg total) by mouth daily. 90 tablet 3   meclizine (ANTIVERT) 25 MG tablet Take 1 tablet (25 mg total) by mouth 2 (two) times daily. 180 tablet 1   Multiple Vitamin (MULTIVITAMIN) tablet Take 1 tablet by mouth daily.     omeprazole (PRILOSEC) 40 MG capsule Take 1 capsule (40 mg total) by mouth 2 (two) times daily. 180 capsule 1   oxybutynin (DITROPAN) 5 MG tablet Take 1 tablet (5 mg total) by mouth 3 (three) times daily. 270 tablet 0   pravastatin (PRAVACHOL) 20 MG tablet TAKE 1 TABLET EVERY DAY 90 tablet 0   traMADol (ULTRAM) 50 MG tablet Take 1 tablet (50 mg total) by mouth 3 (three) times daily as needed. 90 tablet 1   TRUE METRIX BLOOD GLUCOSE TEST test strip USE AS DIRECTED TO CHECK FASTING BLOOD SUGAR. 300 strip 2   TRUEplus Lancets 33G MISC E11.69 use new lancet each time when checking FBS 100 each 2   valsartan (DIOVAN) 80 MG tablet Take 1 tablet (80 mg total) by mouth daily. 90 tablet 3   No current facility-administered medications on file prior to visit.   Past Medical History:  Diagnosis Date   Allergy    Anxiety    Arthritis    Bradycardia,  unspecified    Bruises easily    Calf pain    when walking   Fibromyalgia    Frequent headaches    GERD (gastroesophageal reflux disease)    H/O bladder problems    Hypertension    IBS (irritable bowel syndrome)    Localized edema    Mild persistent asthma, uncomplicated    Morbid (severe) obesity due to excess calories (HCC)    Muscle pain    Myocardial infarction ([place])    Other hypotension    Ovarian cyst    Slow transit constipation    Solitary pulmonary nodule    Swelling    feet and legs   Type 2 diabetes mellitus with diabetic nephropathy (HCC)    Varicose veins of bilateral lower extremities with pain    Past Surgical History:  Procedure Laterality Date    BUNIONECTOMY     left foot 5th toe   CARDIAC CATHETERIZATION     carpel tunnel surgery     CESAREAN SECTION     CHOLECYSTECTOMY     GALLBLADDER SURGERY     TONSILLECTOMY     TUBAL LIGATION     VAGINAL HYSTERECTOMY  2003   LAVH/SR    Family History  Problem Relation Age of Onset   Heart disease Father    Hypertension Father    Stroke Father    Heart attack Father    Cerebrovascular Accident Father    Diabetes Sister    Kidney disease Sister    Migraines Daughter    Breast cancer Maternal Aunt    Cancer Maternal Aunt 74       breast   Social History   Socioeconomic History   Marital status: Married    Spouse name: Not on file   Number of children: 1   Years of education: Not on file   Highest education level: Not on file  Occupational History   Occupation: Disabled due to foot problems since 1993  Tobacco Use   Smoking status: Never   Smokeless tobacco: Never  Vaping Use   Vaping Use: Never used  Substance and Sexual Activity   Alcohol use: No   Drug use: No   Sexual activity: Yes    Birth control/protection: Surgical    Comment: hyst  Other Topics Concern   Not on file  Social History Narrative   Not on file   Social Determinants of Health   Financial Resource Strain: Not on file  Food Insecurity: No Food Insecurity (06/06/2021)   Hunger Vital Sign    Worried About Running Out of Food in the Last Year: Never true    Ran Out of Food in the Last Year: Never true  Transportation Needs: No Transportation Needs (06/06/2021)   PRAPARE - Hydrologist (Medical): No    Lack of Transportation (Non-Medical): No  Physical Activity: Insufficiently Active (05/30/2020)   Exercise Vital Sign    Days of Exercise per Week: 4 days    Minutes of Exercise per Session: 30 min  Stress: No Stress Concern Present (06/06/2021)   [place]    Feeling of Stress : Only a little   Social Connections: Not on file    Review of Systems  Constitutional:  Negative for appetite change, fatigue and fever.  HENT:  Negative for congestion, ear pain, sinus pressure and sore throat.   Respiratory:  Negative for cough, chest tightness, shortness of breath and wheezing.  Cardiovascular:  Negative for chest pain and palpitations.  Gastrointestinal:  Negative for abdominal pain, constipation, diarrhea, nausea and vomiting.  Genitourinary:  Negative for dysuria and hematuria.  Musculoskeletal:  Negative for arthralgias, back pain, joint swelling and myalgias.  Skin:  Negative for rash.  Neurological:  Negative for dizziness, weakness and headaches.  Psychiatric/Behavioral:  Negative for dysphoric mood. The patient is not nervous/anxious.      Objective:  BP 100/68 (BP Location: Right Arm, Patient Position: Sitting, Cuff Size: Large)   Pulse 70   Temp 97.8 F (36.6 C) (Temporal)   Ht _0  (1.575 m)   Wt 194 lb 9.6 oz (88.3 kg)   SpO2 96%   BMI 35.59 kg/m      10/04/2021    7:47 AM 06/06/2021    2:31 PM 05/15/2021    9:42 AM  BP/Weight  Systolic BP 308 657 846  Diastolic BP 68 80 78  Wt. (Lbs) 194.6 196.8 191  BMI 35.59 kg/m2 36 kg/m2 34.93 kg/m2    Physical Exam Vitals reviewed.  Constitutional:      Appearance: Normal appearance. She is normal weight.  Cardiovascular:     Rate and Rhythm: Normal rate and regular rhythm.     Heart sounds: Normal heart sounds.  Pulmonary:     Effort: Pulmonary effort is normal.     Breath sounds: Normal breath sounds.  Abdominal:     General: Abdomen is flat. Bowel sounds are normal.     Palpations: Abdomen is soft.  Neurological:     Mental Status: She is alert and oriented to person, place, and time.  Psychiatric:        Mood and Affect: Mood normal.        Behavior: Behavior normal.     Diabetic Foot Exam - Simple   Simple Foot Form  10/04/2021 10:17 AM  Visual Inspection See comments: Yes Sensation  Testing Intact to touch and monofilament testing bilaterally: Yes Pulse Check Posterior Tibialis and Dorsalis pulse intact bilaterally: Yes Comments Pes planus BL.       Lab Results  Component Value Date   WBC 7.4 10/04/2021   HGB 13.7 10/04/2021   HCT 42.8 10/04/2021   PLT 401 10/04/2021   GLUCOSE 113 (H) 10/04/2021   CHOL 169 10/04/2021   TRIG 292 (H) 10/04/2021   HDL 42 10/04/2021   LDLCALC 79 10/04/2021   ALT 11 10/04/2021   AST 21 10/04/2021   NA 141 10/04/2021   K 4.1 10/04/2021   CL 100 10/04/2021   CREATININE 1.23 (H) 10/04/2021   BUN 14 10/04/2021   CO2 27 10/04/2021   TSH 2.220 10/04/2021   HGBA1C 6.9 (H) 10/04/2021   MICROALBUR 80 09/05/2020      Assessment & Plan:   Problem List Items Addressed This Visit       Cardiovascular and Mediastinum   Hypertension associated with diabetes ([place])   Relevant Orders   Cardiovascular Risk Assessment (Completed)   Interpretation: (Completed)   Hypertensive heart and kidney disease with acute combined systolic and diastolic congestive heart failure and stage 3b chronic kidney disease (HCC)    Continue hydrochlorothiazide 12.5 mg daily.  Decreased valsartan to 40 mg daily.  Check bp daily.       Relevant Orders   CBC with Differential/Platelet (Completed)   Comprehensive metabolic panel (Completed)     Respiratory   Asthma - Primary    Continue albuterol hfa 2 puffs four times a day as needed  Allergic rhinitis due to allergen    Continue claritin        Digestive   Gastroesophageal reflux disease    Continue omeprazole 40 mg twice daily.         Endocrine   Diabetic polyneuropathy associated with type 2 diabetes mellitus ([place])    Control: worsened, but still controlled. Recommend check sugars fasting daily. Recommend check feet daily. Recommend annual eye exams. Medicines: continue jardiance 10 mg daily.  Continue to work on eating a healthy diet and exercise.  Labs drawn today.          Relevant Medications   tiZANidine (ZANAFLEX) 2 MG tablet   Other Relevant Orders   Hemoglobin A1c (Completed)     Musculoskeletal and Integument   Primary fibromyalgia syndrome    Continue duloxetine 60 mg before bed.  Start on tizanidine.      Relevant Medications   tiZANidine (ZANAFLEX) 2 MG tablet     Other   Mixed hyperlipidemia    Await labs/testing for assessment and recommendations. Continue pravastatin 20 mg daily. Recommended start on lovaza 1 gm 2 capsules twice daily.  No changes to medicines.  Continue to work on eating a healthy diet and exercise.  Labs drawn today.        Relevant Orders   Lipid panel (Completed)   TSH (Completed)   Colon cancer screening   Relevant Orders   Ambulatory referral to Gastroenterology   Encounter for hepatitis C screening test for low risk patient   Relevant Orders   HCV Ab w Reflex to Quant PCR (Completed)   Encounter for screening for HIV   Relevant Orders   HIV Antibody (routine testing w rflx) (Completed)  .  Meds ordered this encounter  Medications   tiZANidine (ZANAFLEX) 2 MG tablet    Sig: Take 1 tablet (2 mg total) by mouth every 8 (eight) hours as needed for muscle spasms.    Dispense:  50 tablet    Refill:  0    Orders Placed This Encounter  Procedures   CBC with Differential/Platelet   Comprehensive metabolic panel   Hemoglobin A1c   Lipid panel   TSH   HIV Antibody (routine testing w rflx)   HCV Ab w Reflex to Quant PCR   Cardiovascular Risk Assessment   Interpretation:   Ambulatory referral to Gastroenterology     Follow-up: Return in about 4 months (around 02/03/2022) for chronic fasting.  An After Visit Summary was printed and given to the patient.  [name], MD [facility] [phone]

## 2021-10-04 ENCOUNTER — Encounter: Payer: Self-pay | Admitting: Family Medicine

## 2021-10-04 ENCOUNTER — Ambulatory Visit (INDEPENDENT_AMBULATORY_CARE_PROVIDER_SITE_OTHER): Payer: Medicare HMO | Admitting: Family Medicine

## 2021-10-04 ENCOUNTER — Ambulatory Visit (INDEPENDENT_AMBULATORY_CARE_PROVIDER_SITE_OTHER): Payer: Medicare HMO | Admitting: Podiatry

## 2021-10-04 VITALS — BP 100/68 | HR 70 | Temp 97.8°F | Ht 62.0 in | Wt 194.6 lb

## 2021-10-04 DIAGNOSIS — E1159 Type 2 diabetes mellitus with other circulatory complications: Secondary | ICD-10-CM | POA: Diagnosis not present

## 2021-10-04 DIAGNOSIS — M2141 Flat foot [pes planus] (acquired), right foot: Secondary | ICD-10-CM

## 2021-10-04 DIAGNOSIS — M79675 Pain in left toe(s): Secondary | ICD-10-CM

## 2021-10-04 DIAGNOSIS — Z114 Encounter for screening for human immunodeficiency virus [HIV]: Secondary | ICD-10-CM | POA: Diagnosis not present

## 2021-10-04 DIAGNOSIS — I130 Hypertensive heart and chronic kidney disease with heart failure and stage 1 through stage 4 chronic kidney disease, or unspecified chronic kidney disease: Secondary | ICD-10-CM

## 2021-10-04 DIAGNOSIS — M21619 Bunion of unspecified foot: Secondary | ICD-10-CM

## 2021-10-04 DIAGNOSIS — I5041 Acute combined systolic (congestive) and diastolic (congestive) heart failure: Secondary | ICD-10-CM

## 2021-10-04 DIAGNOSIS — B351 Tinea unguium: Secondary | ICD-10-CM | POA: Diagnosis not present

## 2021-10-04 DIAGNOSIS — J4520 Mild intermittent asthma, uncomplicated: Secondary | ICD-10-CM | POA: Diagnosis not present

## 2021-10-04 DIAGNOSIS — Z1159 Encounter for screening for other viral diseases: Secondary | ICD-10-CM

## 2021-10-04 DIAGNOSIS — J3089 Other allergic rhinitis: Secondary | ICD-10-CM | POA: Diagnosis not present

## 2021-10-04 DIAGNOSIS — M797 Fibromyalgia: Secondary | ICD-10-CM

## 2021-10-04 DIAGNOSIS — K219 Gastro-esophageal reflux disease without esophagitis: Secondary | ICD-10-CM | POA: Diagnosis not present

## 2021-10-04 DIAGNOSIS — E1142 Type 2 diabetes mellitus with diabetic polyneuropathy: Secondary | ICD-10-CM

## 2021-10-04 DIAGNOSIS — N1832 Chronic kidney disease, stage 3b: Secondary | ICD-10-CM

## 2021-10-04 DIAGNOSIS — E782 Mixed hyperlipidemia: Secondary | ICD-10-CM

## 2021-10-04 DIAGNOSIS — M79674 Pain in right toe(s): Secondary | ICD-10-CM

## 2021-10-04 DIAGNOSIS — Z1211 Encounter for screening for malignant neoplasm of colon: Secondary | ICD-10-CM | POA: Diagnosis not present

## 2021-10-04 DIAGNOSIS — M2142 Flat foot [pes planus] (acquired), left foot: Secondary | ICD-10-CM | POA: Diagnosis not present

## 2021-10-04 DIAGNOSIS — I152 Hypertension secondary to endocrine disorders: Secondary | ICD-10-CM

## 2021-10-04 MED ORDER — TIZANIDINE HCL 2 MG PO TABS: 2.0000 mg | ORAL_TABLET | Freq: Three times a day (TID) | ORAL | 0 refills | Status: DC | PRN

## 2021-10-04 NOTE — Patient Instructions (Addendum)
Call Gynecology.   Decreased valsartan to 40 mg daily.  Check bp daily.

## 2021-10-04 NOTE — Progress Notes (Unsigned)
  Subjective:  Patient ID: Andrea White, female    DOB: 09/03/1955,  MRN: [id number]  Andrea White presents to clinic today for at risk foot care with history of diabetic neuropathy and painful elongated mycotic toenails 1-5 bilaterally which are tender when wearing enclosed shoe gear. Pain is relieved with periodic professional debridement.  Last known  HgA1c was 6.5%. Patient did not check blood glucose this morning.  New problem(s): None.   Patient is also here to pick up her diabetic shoes/3 pairs of inserts on today's visit.  PCP is Cox, [name], MD , and last visit was  October 04, 2021.  Allergies  Allergen Reactions   Morphine And Related Other (See Comments)    Woozy, "out of body experience"   Celebrex [Celecoxib] Other (See Comments)    Patient does not remember intolerance   Fenofibrate     Vertigo    Gabapentin Other (See Comments)    Confusion   Lisinopril Cough   Guaifenesin & Derivatives Other (See Comments)    Patient cannot remember specific issue, just made her feel "weird"   Lipitor [Atorvastatin] Rash   Sulfa Antibiotics Rash   Valium [Diazepam] Other (See Comments)    Lightheaded, dizziness    Review of Systems: Negative except as noted in the HPI.  Objective: No changes noted in today's physical examination. Andrea White is a pleasant 66 y.o. female in NAD. AAO x 3.  Assessment/Plan: 1. Pain due to onychomycosis of toenails of both feet   2. Pes planus of both feet   3. Bunion   4. Diabetic polyneuropathy associated with type 2 diabetes mellitus ([place])     -Patient was evaluated and treated. All patient's and/or POA's questions/concerns answered on today's visit. -Continue foot and shoe inspections daily. Monitor blood glucose per PCP/Endocrinologist's recommendations. -Patient to continue soft, supportive shoe gear daily. -She did try on her diabetic shoes and her left shoe was too tight. We will order 9.5 wide and call her when they  arrive. -Mycotic toenails 1-5 bilaterally were debrided in length and girth with sterile nail nippers and dremel without incident. -Patient/POA to call should there be question/concern in the interim.   Return in about 3 months (around 01/03/2022).  Marzetta Board, DPM

## 2021-10-05 LAB — CBC WITH DIFFERENTIAL/PLATELET
Basophils Absolute: 0.1 x10E3/uL (ref 0.0–0.2)
Basos: 1 %
EOS (ABSOLUTE): 0.3 x10E3/uL (ref 0.0–0.4)
Eos: 4 %
Hematocrit: 42.8 % (ref 34.0–46.6)
Hemoglobin: 13.7 g/dL (ref 11.1–15.9)
Immature Grans (Abs): 0 x10E3/uL (ref 0.0–0.1)
Immature Granulocytes: 0 %
Lymphocytes Absolute: 3.1 x10E3/uL (ref 0.7–3.1)
Lymphs: 42 %
MCH: 28 pg (ref 26.6–33.0)
MCHC: 32 g/dL (ref 31.5–35.7)
MCV: 87 fL (ref 79–97)
Monocytes Absolute: 0.7 x10E3/uL (ref 0.1–0.9)
Monocytes: 9 %
Neutrophils Absolute: 3.2 x10E3/uL (ref 1.4–7.0)
Neutrophils: 44 %
Platelets: 401 x10E3/uL (ref 150–450)
RBC: 4.9 x10E6/uL (ref 3.77–5.28)
RDW: 13.2 % (ref 11.7–15.4)
WBC: 7.4 x10E3/uL (ref 3.4–10.8)

## 2021-10-05 LAB — LIPID PANEL
Chol/HDL Ratio: 4 ratio (ref 0.0–4.4)
Cholesterol, Total: 169 mg/dL (ref 100–199)
HDL: 42 mg/dL (ref >39)
LDL Chol Calc (NIH): 79 mg/dL (ref 0–99)
Triglycerides: 292 mg/dL — ABNORMAL HIGH (ref 0–149)
VLDL Cholesterol Cal: 48 mg/dL — ABNORMAL HIGH (ref 5–40)

## 2021-10-05 LAB — COMPREHENSIVE METABOLIC PANEL
ALT: 11 IU/L (ref 0–32)
AST: 21 IU/L (ref 0–40)
Albumin/Globulin Ratio: 1.8 (ref 1.2–2.2)
Albumin: 4.1 g/dL (ref 3.9–4.9)
Alkaline Phosphatase: 112 IU/L (ref 44–121)
BUN/Creatinine Ratio: 11 — ABNORMAL LOW (ref 12–28)
BUN: 14 mg/dL (ref 8–27)
Bilirubin Total: 0.5 mg/dL (ref 0.0–1.2)
CO2: 27 mmol/L (ref 20–29)
Calcium: 10 mg/dL (ref 8.7–10.3)
Chloride: 100 mmol/L (ref 96–106)
Creatinine, Ser: 1.23 mg/dL — ABNORMAL HIGH (ref 0.57–1.00)
Globulin, Total: 2.3 g/dL (ref 1.5–4.5)
Glucose: 113 mg/dL — ABNORMAL HIGH (ref 70–99)
Potassium: 4.1 mmol/L (ref 3.5–5.2)
Sodium: 141 mmol/L (ref 134–144)
Total Protein: 6.4 g/dL (ref 6.0–8.5)
eGFR: 49 mL/min/{1.73_m2} — ABNORMAL LOW (ref >59)

## 2021-10-05 LAB — CARDIOVASCULAR RISK ASSESSMENT

## 2021-10-05 LAB — HEMOGLOBIN A1C
Est. average glucose Bld gHb Est-mCnc: 151 mg/dL
Hgb A1c MFr Bld: 6.9 % — ABNORMAL HIGH (ref 4.8–5.6)

## 2021-10-05 LAB — HIV ANTIBODY (ROUTINE TESTING W REFLEX): HIV Screen 4th Generation wRfx: NONREACTIVE

## 2021-10-05 LAB — HCV AB W REFLEX TO QUANT PCR: HCV Ab: NONREACTIVE

## 2021-10-05 LAB — HCV INTERPRETATION

## 2021-10-05 LAB — TSH: TSH: 2.22 u[IU]/mL (ref 0.450–4.500)

## 2021-10-07 NOTE — Progress Notes (Signed)
Blood count normal.  Liver function normal.  Kidney function abnormal still but much better. Back at baseline. Recommend start on kerendia 10 mg daily.  Thyroid function normal.  Cholesterol: triglycerides worsened to 292. HDL good. LDL good. Recommend start on lovaza 1 gm 2 capsules twice daily.  HBA1C: 6.9. worsened, but still less than 7 and at goal. HIV and Hep C negative.

## 2021-10-08 ENCOUNTER — Encounter: Payer: Self-pay | Admitting: Podiatry

## 2021-10-08 ENCOUNTER — Telehealth: Payer: Self-pay

## 2021-10-08 ENCOUNTER — Other Ambulatory Visit: Payer: Self-pay

## 2021-10-08 MED ORDER — OMEGA-3-ACID ETHYL ESTERS 1 G PO CAPS: 2.0000 g | ORAL_CAPSULE | Freq: Two times a day (BID) | ORAL | 0 refills | Status: DC

## 2021-10-08 NOTE — Telephone Encounter (Signed)
Lashon called back about the medication changes.  She is willing to try the Lovaza at this time but she does not want the [place].  RX sent to pharmacy.

## 2021-10-09 IMAGING — MG DIGITAL SCREENING BILAT W/ TOMO W/ CAD
6 of 10 series · 6 of 30 positions shown · non-contrast
Comparison: Previous exams 07/15/2017 and earlier [facility]].

CLINICAL DATA: Screening.

EXAM:
DIGITAL SCREENING BILATERAL MAMMOGRAM WITH TOMO AND CAD

[R CC synth-2D (1 of 2)]
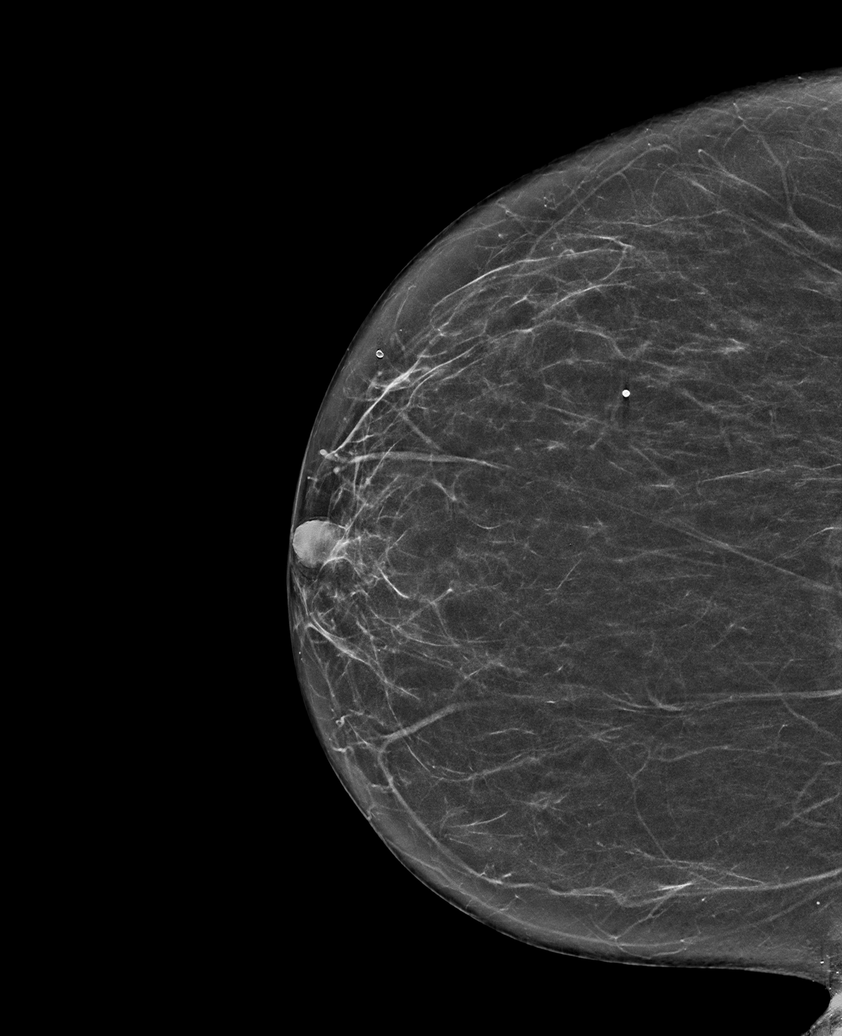

[R MLO synth-2D]
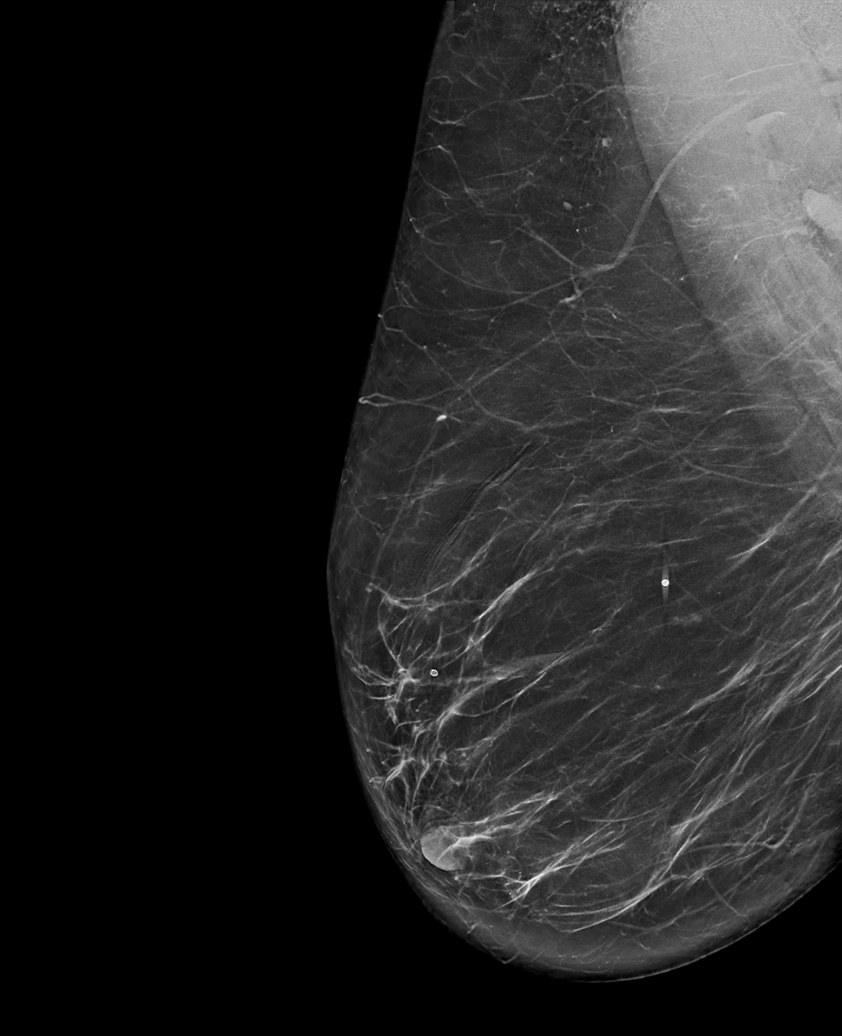

[R CC synth-2D (2 of 2)]
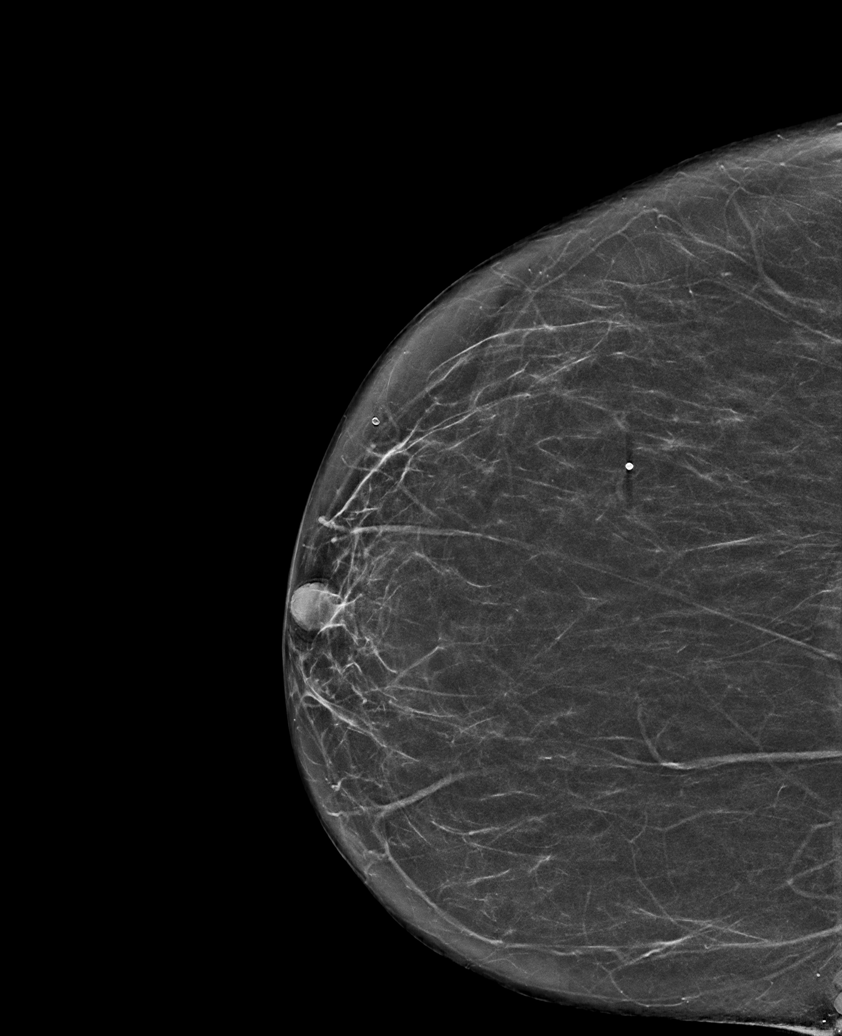

[L CC synth-2D]
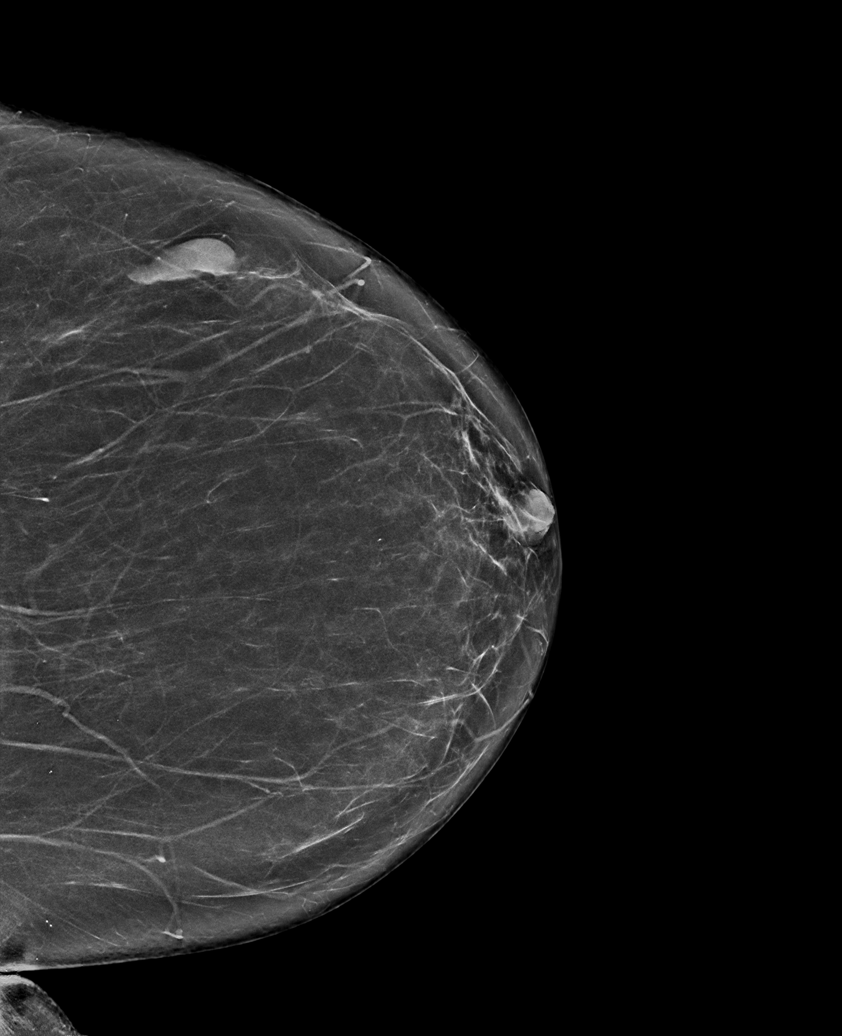

[L MLO synth-2D]
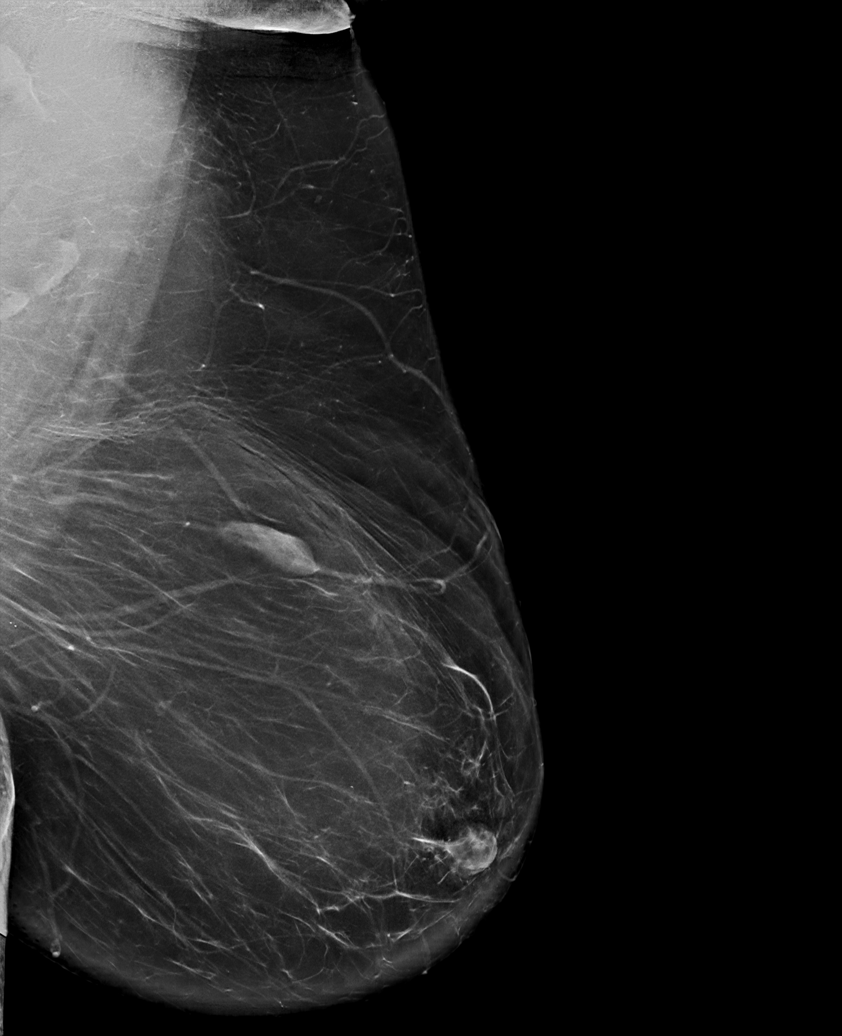

[L MLO tomo · tomo slice 45/90.0]
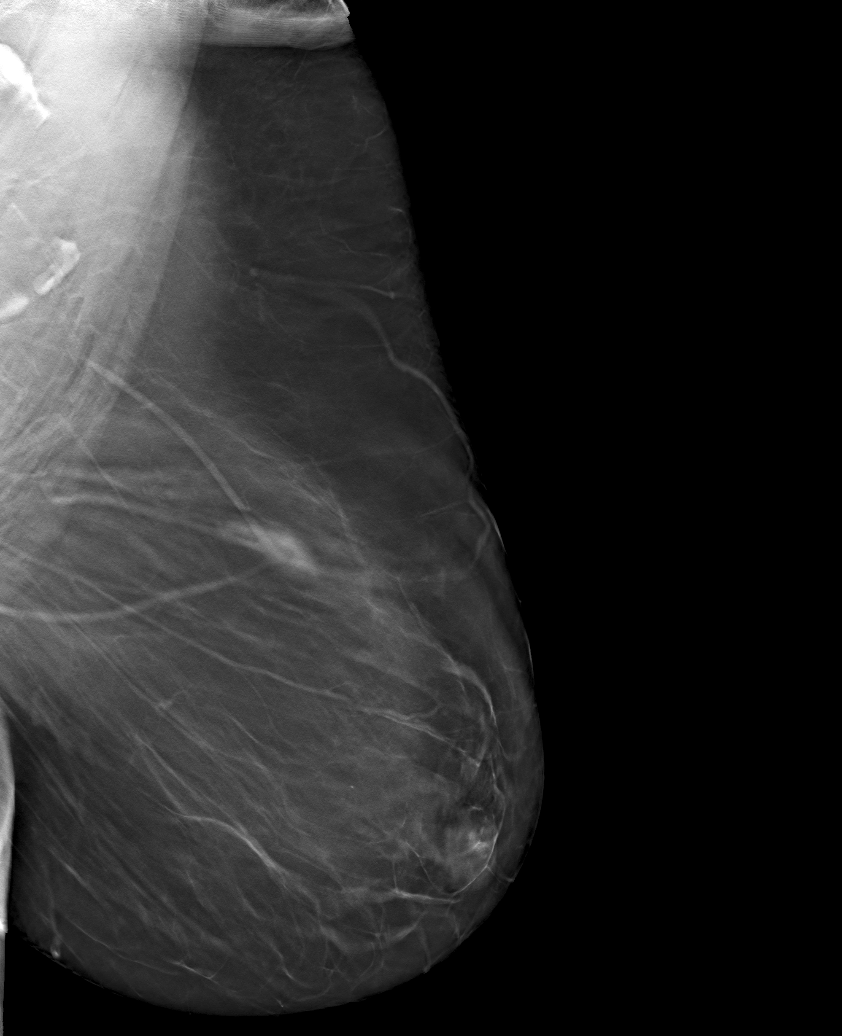

[6 of 30 positions shown; findings below may reference images not displayed]

Awaiting the prior
outside mammograms accounts for the delay in this report.

ACR Breast Density Category a: The breast tissue is almost entirely
fatty.
FINDINGS: There are no findings suspicious for malignancy. Images were
processed with CAD.
IMPRESSION: No mammographic evidence of malignancy. A result letter of this
screening mammogram will be mailed directly to the patient.

RECOMMENDATION:
Screening mammogram in one year. (Code:35-Q-7T9)

BI-RADS CATEGORY  1: Negative.

## 2021-10-14 DIAGNOSIS — Z114 Encounter for screening for human immunodeficiency virus [HIV]: Secondary | ICD-10-CM | POA: Insufficient documentation

## 2021-10-14 DIAGNOSIS — Z1211 Encounter for screening for malignant neoplasm of colon: Secondary | ICD-10-CM | POA: Insufficient documentation

## 2021-10-14 DIAGNOSIS — Z1159 Encounter for screening for other viral diseases: Secondary | ICD-10-CM | POA: Insufficient documentation

## 2021-10-14 NOTE — Assessment & Plan Note (Signed)
Await labs/testing for assessment and recommendations. Continue pravastatin 20 mg daily. Recommended start on lovaza 1 gm 2 capsules twice daily.  No changes to medicines.  Continue to work on eating a healthy diet and exercise.  Labs drawn today.

## 2021-10-14 NOTE — Assessment & Plan Note (Addendum)
Continue duloxetine 60 mg before bed.  Start on tizanidine.

## 2021-10-14 NOTE — Assessment & Plan Note (Signed)
Continue albuterol hfa 2 puffs four times a day as needed

## 2021-10-14 NOTE — Assessment & Plan Note (Signed)
Continue omeprazole 40 mg twice daily.

## 2021-10-14 NOTE — Assessment & Plan Note (Signed)
Continue hydrochlorothiazide 12.5 mg daily.  Decreased valsartan to 40 mg daily.  Check bp daily.

## 2021-10-14 NOTE — Assessment & Plan Note (Signed)
Continue claritin. °

## 2021-10-14 NOTE — Assessment & Plan Note (Signed)
Control: worsened, but still controlled. Recommend check sugars fasting daily. Recommend check feet daily. Recommend annual eye exams. Medicines: continue jardiance 10 mg daily.  Continue to work on eating a healthy diet and exercise.  Labs drawn today.

## 2021-10-14 NOTE — Assessment & Plan Note (Signed)
>>  ASSESSMENT AND PLAN FOR ASTHMA WRITTEN ON 10/14/2021 10:21 AM BY COX, KIRSTEN, MD  Continue albuterol  hfa 2 puffs four times a day as needed

## 2021-10-23 DIAGNOSIS — M791 Myalgia, unspecified site: Secondary | ICD-10-CM | POA: Diagnosis not present

## 2021-10-23 DIAGNOSIS — Z79891 Long term (current) use of opiate analgesic: Secondary | ICD-10-CM | POA: Diagnosis not present

## 2021-10-23 DIAGNOSIS — G894 Chronic pain syndrome: Secondary | ICD-10-CM | POA: Diagnosis not present

## 2021-10-23 DIAGNOSIS — Z79899 Other long term (current) drug therapy: Secondary | ICD-10-CM | POA: Diagnosis not present

## 2021-10-23 DIAGNOSIS — Z6836 Body mass index (BMI) 36.0-36.9, adult: Secondary | ICD-10-CM | POA: Diagnosis not present

## 2021-10-29 ENCOUNTER — Other Ambulatory Visit: Payer: Self-pay | Admitting: Family Medicine

## 2021-10-30 ENCOUNTER — Telehealth: Payer: Self-pay | Admitting: Podiatry

## 2021-10-30 NOTE — Telephone Encounter (Signed)
Pt called to inquire about the status of her DB shoes that had to be sent off for an adjustment

## 2021-11-07 NOTE — Telephone Encounter (Signed)
Pt calling back about diabetic shoes that was sent off for adjustment.

## 2021-11-08 ENCOUNTER — Telehealth: Payer: Self-pay | Admitting: Podiatry

## 2021-11-08 NOTE — Telephone Encounter (Signed)
Patient called to check the status of her shoes, they were to be sent back to [employer] and get her an extra wide pair of shoes in size 9 or a 9.5.  Please call with a status update she has been waiting since March to get her shoes .

## 2021-11-12 DIAGNOSIS — M1812 Unilateral primary osteoarthritis of first carpometacarpal joint, left hand: Secondary | ICD-10-CM | POA: Diagnosis not present

## 2021-11-12 NOTE — Telephone Encounter (Signed)
Pt called to check the status of her diabetic shoes. Call was transferred to [facility]

## 2021-11-12 NOTE — Telephone Encounter (Signed)
Spoke with pt and informed her that her shoes have been ordered. Pt was concerned about the size so I have changed the order of the shoe to a 9 extra wide to see if this is more comfortable for her.

## 2021-11-13 DIAGNOSIS — Z01419 Encounter for gynecological examination (general) (routine) without abnormal findings: Secondary | ICD-10-CM | POA: Diagnosis not present

## 2021-11-13 DIAGNOSIS — Z1231 Encounter for screening mammogram for malignant neoplasm of breast: Secondary | ICD-10-CM | POA: Diagnosis not present

## 2021-11-13 DIAGNOSIS — Z6835 Body mass index (BMI) 35.0-35.9, adult: Secondary | ICD-10-CM | POA: Diagnosis not present

## 2021-11-13 DIAGNOSIS — N952 Postmenopausal atrophic vaginitis: Secondary | ICD-10-CM | POA: Diagnosis not present

## 2021-11-13 DIAGNOSIS — Z1211 Encounter for screening for malignant neoplasm of colon: Secondary | ICD-10-CM | POA: Diagnosis not present

## 2021-11-13 DIAGNOSIS — Z13820 Encounter for screening for osteoporosis: Secondary | ICD-10-CM | POA: Diagnosis not present

## 2021-11-14 ENCOUNTER — Other Ambulatory Visit: Payer: Self-pay | Admitting: Obstetrics and Gynecology

## 2021-11-14 DIAGNOSIS — Z13820 Encounter for screening for osteoporosis: Secondary | ICD-10-CM

## 2021-11-16 ENCOUNTER — Other Ambulatory Visit: Payer: Self-pay | Admitting: Family Medicine

## 2021-11-20 ENCOUNTER — Other Ambulatory Visit: Payer: Self-pay | Admitting: Family Medicine

## 2021-11-20 ENCOUNTER — Ambulatory Visit (INDEPENDENT_AMBULATORY_CARE_PROVIDER_SITE_OTHER): Payer: Medicare HMO | Admitting: Family Medicine

## 2021-11-20 VITALS — BP 118/62 | HR 81 | Temp 97.1°F | Ht 62.0 in | Wt 195.0 lb

## 2021-11-20 DIAGNOSIS — L03116 Cellulitis of left lower limb: Secondary | ICD-10-CM | POA: Diagnosis not present

## 2021-11-20 DIAGNOSIS — S91332A Puncture wound without foreign body, left foot, initial encounter: Secondary | ICD-10-CM

## 2021-11-20 DIAGNOSIS — Z23 Encounter for immunization: Secondary | ICD-10-CM

## 2021-11-20 DIAGNOSIS — T148XXA Other injury of unspecified body region, initial encounter: Secondary | ICD-10-CM | POA: Diagnosis not present

## 2021-11-20 MED ORDER — AMOXICILLIN-POT CLAVULANATE 875-125 MG PO TABS: 1.0000 | ORAL_TABLET | Freq: Two times a day (BID) | ORAL | 0 refills | Status: DC

## 2021-11-20 NOTE — Progress Notes (Signed)
Subjective:  Patient ID: Andrea White, female    DOB: Apr 25, 1955  Age: 66 y.o. MRN: [id number]  Chief Complaint  Patient presents with   Puncture Wound    HPI Patient came in for a puncture wound on left foot. Patient accidentally punctured her left foot with a pitch fork.   Current Outpatient Medications on File Prior to Visit  Medication Sig Dispense Refill   albuterol (VENTOLIN HFA) 108 (90 Base) MCG/ACT inhaler Inhale 2 puffs into the lungs every 6 (six) hours as needed for wheezing or shortness of breath. 9 g 3   ALPRAZolam (XANAX) 0.5 MG tablet TAKE 1/2 TO 1 TABLET EVERY 8 HOURS AS NEEDED 30 tablet 2   APPLE CIDER VINEGAR PO Take by mouth daily.     Biotin 10000 MCG TABS Take 1 tablet by mouth daily.     Blood Glucose Monitoring Suppl (TRUE METRIX METER) w/Device KIT USE AS DIRECTED 1 kit 0   diclofenac Sodium (VOLTAREN) 1 % GEL Apply 4 g topically 4 (four) times daily as needed (knees, foot, or hand pain). 1000 g 3   DULoxetine (CYMBALTA) 60 MG capsule Take 1 capsule (60 mg total) by mouth daily. 90 capsule 1   empagliflozin (JARDIANCE) 10 MG TABS tablet Take 1 tablet (10 mg total) by mouth daily before breakfast. 90 tablet 0   hydrochlorothiazide (MICROZIDE) 12.5 MG capsule Take 1 capsule (12.5 mg total) by mouth daily. 90 capsule 0   loratadine (CLARITIN) 10 MG tablet Take 1 tablet (10 mg total) by mouth daily. 90 tablet 3   meclizine (ANTIVERT) 25 MG tablet TAKE 1 TABLET TWICE DAILY 180 tablet 1   Multiple Vitamin (MULTIVITAMIN) tablet Take 1 tablet by mouth daily.     omega-3 acid ethyl esters (LOVAZA) 1 g capsule Take 2 capsules (2 g total) by mouth 2 (two) times daily. 360 capsule 0   omeprazole (PRILOSEC) 40 MG capsule TAKE 1 CAPSULE TWICE DAILY 180 capsule 1   oxybutynin (DITROPAN) 5 MG tablet Take 1 tablet (5 mg total) by mouth 3 (three) times daily. 270 tablet 0   pravastatin (PRAVACHOL) 20 MG tablet TAKE 1 TABLET EVERY DAY 90 tablet 0   tiZANidine (ZANAFLEX) 2 MG  tablet Take 1 tablet (2 mg total) by mouth every 8 (eight) hours as needed for muscle spasms. 50 tablet 0   traMADol (ULTRAM) 50 MG tablet Take 1 tablet (50 mg total) by mouth 3 (three) times daily as needed. 90 tablet 1   TRUE METRIX BLOOD GLUCOSE TEST test strip USE AS DIRECTED TO CHECK FASTING BLOOD SUGAR. 300 strip 2   TRUEplus Lancets 33G MISC E11.69 use new lancet each time when checking FBS 100 each 2   valsartan (DIOVAN) 80 MG tablet Take 1 tablet (80 mg total) by mouth daily. 90 tablet 3   No current facility-administered medications on file prior to visit.   Past Medical History:  Diagnosis Date   Allergy    Anxiety    Arthritis    Bradycardia, unspecified    Bruises easily    Calf pain    when walking   Fibromyalgia    Frequent headaches    GERD (gastroesophageal reflux disease)    H/O bladder problems    Hypertension    IBS (irritable bowel syndrome)    Localized edema    Mild persistent asthma, uncomplicated    Morbid (severe) obesity due to excess calories (HCC)    Muscle pain    Myocardial infarction ([place])  Other hypotension    Ovarian cyst    Slow transit constipation    Solitary pulmonary nodule    Swelling    feet and legs   Type 2 diabetes mellitus with diabetic nephropathy (HCC)    Varicose veins of bilateral lower extremities with pain    Past Surgical History:  Procedure Laterality Date   BUNIONECTOMY     left foot 5th toe   CARDIAC CATHETERIZATION     carpel tunnel surgery     CESAREAN SECTION     CHOLECYSTECTOMY     GALLBLADDER SURGERY     TONSILLECTOMY     TUBAL LIGATION     VAGINAL HYSTERECTOMY  2003   LAVH/SR    Family History  Problem Relation Age of Onset   Heart disease Father    Hypertension Father    Stroke Father    Heart attack Father    Cerebrovascular Accident Father    Diabetes Sister    Kidney disease Sister    Migraines Daughter    Breast cancer Maternal Aunt    Cancer Maternal Aunt 74       breast   Social  History   Socioeconomic History   Marital status: Married    Spouse name: Not on file   Number of children: 1   Years of education: Not on file   Highest education level: Not on file  Occupational History   Occupation: Disabled due to foot problems since 1993  Tobacco Use   Smoking status: Never   Smokeless tobacco: Never  Vaping Use   Vaping Use: Never used  Substance and Sexual Activity   Alcohol use: No   Drug use: No   Sexual activity: Yes    Birth control/protection: Surgical    Comment: hyst  Other Topics Concern   Not on file  Social History Narrative   Not on file   Social Determinants of Health   Financial Resource Strain: Not on file  Food Insecurity: No Food Insecurity (06/06/2021)   Hunger Vital Sign    Worried About Running Out of Food in the Last Year: Never true    Ran Out of Food in the Last Year: Never true  Transportation Needs: No Transportation Needs (06/06/2021)   PRAPARE - Hydrologist (Medical): No    Lack of Transportation (Non-Medical): No  Physical Activity: Insufficiently Active (05/30/2020)   Exercise Vital Sign    Days of Exercise per Week: 4 days    Minutes of Exercise per Session: 30 min  Stress: No Stress Concern Present (06/06/2021)   [place]    Feeling of Stress : Only a little  Social Connections: Not on file    Review of Systems  Constitutional:  Negative for chills and fatigue.  HENT:  Negative for congestion, ear pain, nosebleeds, sinus pain and sore throat.   Respiratory:  Negative for cough and shortness of breath.   Cardiovascular:  Negative for chest pain and leg swelling.  Gastrointestinal:  Negative for abdominal pain, constipation, diarrhea, nausea and vomiting.  Genitourinary:  Negative for dysuria and frequency.  Musculoskeletal:  Negative for arthralgias, back pain and myalgias.  Neurological:  Negative for dizziness and  headaches.     Objective:  BP 118/62   Pulse 81   Temp (!) 97.1 F (36.2 C)   Ht 5' 2" (1.575 m)   Wt 195 lb (88.5 kg)   SpO2 96%  BMI 35.67 kg/m      11/20/2021   11:38 AM 10/04/2021    7:47 AM 06/06/2021    2:31 PM  BP/Weight  Systolic BP 118 100 138  Diastolic BP 62 68 80  Wt. (Lbs) 195 194.6 196.8  BMI 35.67 kg/m2 35.59 kg/m2 36 kg/m2    Physical Exam Vitals reviewed.  Constitutional:      Appearance: Normal appearance.  Skin:    Comments: Left medial ankle - puncture wound. Erythema around puncture. Tender.  Neurological:     Mental Status: She is alert.     Diabetic Foot Exam - Simple   No data filed      Lab Results  Component Value Date   WBC 7.4 10/04/2021   HGB 13.7 10/04/2021   HCT 42.8 10/04/2021   PLT 401 10/04/2021   GLUCOSE 113 (H) 10/04/2021   CHOL 169 10/04/2021   TRIG 292 (H) 10/04/2021   HDL 42 10/04/2021   LDLCALC 79 10/04/2021   ALT 11 10/04/2021   AST 21 10/04/2021   NA 141 10/04/2021   K 4.1 10/04/2021   CL 100 10/04/2021   CREATININE 1.23 (H) 10/04/2021   BUN 14 10/04/2021   CO2 27 10/04/2021   TSH 2.220 10/04/2021   HGBA1C 6.9 (H) 10/04/2021   MICROALBUR 80 09/05/2020      Assessment & Plan:   Problem List Items Addressed This Visit       Musculoskeletal and Integument   Cellulitis of left foot - Primary    Rx: augmentin        Other   Need for influenza vaccination   Relevant Orders   Flu Vaccine MDCK QUAD PF (Completed)   Need for vaccination   Relevant Orders   Tdap vaccine greater than or equal to 7yo IM (Completed)   Puncture wound    Tdap given     .  No orders of the defined types were placed in this encounter.   Orders Placed This Encounter  Procedures   Flu Vaccine MDCK QUAD PF   Tdap vaccine greater than or equal to 7yo IM     Follow-up: Return if symptoms worsen or fail to improve.  An After Visit Summary was printed and given to the patient.   , MD  [facility] [phone] 

## 2021-11-24 DIAGNOSIS — L03116 Cellulitis of left lower limb: Secondary | ICD-10-CM | POA: Insufficient documentation

## 2021-11-24 DIAGNOSIS — T148XXA Other injury of unspecified body region, initial encounter: Secondary | ICD-10-CM | POA: Insufficient documentation

## 2021-11-24 DIAGNOSIS — Z23 Encounter for immunization: Secondary | ICD-10-CM | POA: Insufficient documentation

## 2021-11-24 NOTE — Assessment & Plan Note (Signed)
Rx augmentin.  

## 2021-11-24 NOTE — Assessment & Plan Note (Signed)
Tdap given.  

## 2021-11-28 ENCOUNTER — Telehealth: Payer: Self-pay

## 2021-11-28 NOTE — Chronic Care Management (AMB) (Signed)
Chronic Care Management Pharmacy Assistant   Name: Andrea White  MRN: [id number] DOB: 11-25-1955  Reason for Encounter: Disease State/ General  Recent office visits:  11-20-2021 [name], MD. Flu vaccine and Tdap given.  10-04-2021 Cox, [name], MD. Glucose= 113, Creatinine= 1.23, eGFR= 49, BUN/Creatinine Ratio= 11. A1C= 6.9. Trig= 292, VLDL= 48. Referral placed to gastro. STOP fenofibrate. START tizanidine 2 mg every 8 hours PRN.  08-20-2021 Mammogram completed.  06-06-2021 [name], MD. Mammogram ordered.  Recent consult visits:  10-23-2021 Starling Manns (Orthopedic surgery). Unable to view encounter.  10-04-2021 Marzetta Board, DPM (Podiatry). Mycotic toenails 1-5 bilaterally were debrided in length and girth with sterile nail nippers and dremel without incident.   09-18-2021 Bronson Ing, DPM (Podiatry). Fitting and Delivery of Runner, broadcasting/film/video.  09-17-2021 Hester Mates (Optometry). Unable to view encounter.  08-21-2021 Joya Salm (Orthopedic surgery). Unable to view encounter.  08-07-2021 Claudia Desanctis (Nephrology). Unable to view encounter.  07-02-2021 Lorenda Peck, DPM (Podiatry). Mechanically debrided nails x10.   Hospital visits:  None in previous 6 months  Medications: Outpatient Encounter Medications as of 11/28/2021  Medication Sig   albuterol (VENTOLIN HFA) 108 (90 Base) MCG/ACT inhaler Inhale 2 puffs into the lungs every 6 (six) hours as needed for wheezing or shortness of breath.   ALPRAZolam (XANAX) 0.5 MG tablet TAKE 1/2 TO 1 TABLET EVERY 8 HOURS AS NEEDED   amoxicillin-clavulanate (AUGMENTIN) 875-125 MG tablet Take 1 tablet by mouth 2 (two) times daily.   APPLE CIDER VINEGAR PO Take by mouth daily.   Biotin 10000 MCG TABS Take 1 tablet by mouth daily.   Blood Glucose Monitoring Suppl (TRUE METRIX METER) w/Device KIT USE AS DIRECTED   diclofenac Sodium (VOLTAREN) 1 % GEL Apply 4 g topically 4 (four) times  daily as needed (knees, foot, or hand pain).   DULoxetine (CYMBALTA) 60 MG capsule Take 1 capsule (60 mg total) by mouth daily.   empagliflozin (JARDIANCE) 10 MG TABS tablet Take 1 tablet (10 mg total) by mouth daily before breakfast.   hydrochlorothiazide (MICROZIDE) 12.5 MG capsule Take 1 capsule (12.5 mg total) by mouth daily.   loratadine (CLARITIN) 10 MG tablet Take 1 tablet (10 mg total) by mouth daily.   meclizine (ANTIVERT) 25 MG tablet TAKE 1 TABLET TWICE DAILY   Multiple Vitamin (MULTIVITAMIN) tablet Take 1 tablet by mouth daily.   omega-3 acid ethyl esters (LOVAZA) 1 g capsule Take 2 capsules (2 g total) by mouth 2 (two) times daily.   omeprazole (PRILOSEC) 40 MG capsule TAKE 1 CAPSULE TWICE DAILY   oxybutynin (DITROPAN) 5 MG tablet Take 1 tablet (5 mg total) by mouth 3 (three) times daily.   pravastatin (PRAVACHOL) 20 MG tablet TAKE 1 TABLET EVERY DAY   tiZANidine (ZANAFLEX) 2 MG tablet Take 1 tablet (2 mg total) by mouth every 8 (eight) hours as needed for muscle spasms.   traMADol (ULTRAM) 50 MG tablet Take 1 tablet (50 mg total) by mouth 3 (three) times daily as needed.   TRUE METRIX BLOOD GLUCOSE TEST test strip USE AS DIRECTED TO CHECK FASTING BLOOD SUGAR.   TRUEplus Lancets 33G MISC E11.69 use new lancet each time when checking FBS   valsartan (DIOVAN) 80 MG tablet Take 1 tablet (80 mg total) by mouth daily.   No facility-administered encounter medications on file as of 11/28/2021.   11-28-2021: 1st attempt left VM 11-29-2021: 2nd attempt left VM 11-30-2021: 3rd attempt left VM  Care  Gaps: Shingrix overdue PAP smear overdue Colonoscopy overdue Covid booster overdue  Star Rating Drugs: Pravastatin 20 mg- Last filled 11-16-2021 90 DS. Previous 09-07-2021 90 DS. Valsartan 80 mg- Last filled 10-03-2021 90 DS. Previous 07-26-2021 90 DS Jardiance 10 mg- Last filled 11-11-2021 90 DS. Previous 09-04-2021 90 DS  [place] Clinical Pharmacist  Assistant [phone]

## 2021-11-29 ENCOUNTER — Telehealth: Payer: Self-pay | Admitting: Podiatry

## 2021-11-29 NOTE — Telephone Encounter (Signed)
Pt called to check on diabetic shoes. I checked the chart and no updates so I transferred to [place].

## 2021-12-02 ENCOUNTER — Other Ambulatory Visit: Payer: Self-pay | Admitting: Family Medicine

## 2021-12-03 DIAGNOSIS — N1831 Chronic kidney disease, stage 3a: Secondary | ICD-10-CM | POA: Diagnosis not present

## 2021-12-05 ENCOUNTER — Ambulatory Visit (INDEPENDENT_AMBULATORY_CARE_PROVIDER_SITE_OTHER): Payer: Medicare HMO

## 2021-12-05 DIAGNOSIS — E1142 Type 2 diabetes mellitus with diabetic polyneuropathy: Secondary | ICD-10-CM | POA: Diagnosis not present

## 2021-12-05 DIAGNOSIS — M2142 Flat foot [pes planus] (acquired), left foot: Secondary | ICD-10-CM

## 2021-12-05 DIAGNOSIS — M2141 Flat foot [pes planus] (acquired), right foot: Secondary | ICD-10-CM | POA: Diagnosis not present

## 2021-12-05 NOTE — Progress Notes (Signed)
Patient presents today to pick up diabetic shoes and insoles.  Patient was dispensed 1 pair of diabetic shoes and 3 pairs of foam casted diabetic insoles. Fit was satisfactory. Instructions for break-in and wear was reviewed and a copy was given to the patient.   Re-appointment for regularly scheduled diabetic foot care visits or if they should experience any trouble with the shoes or insoles.   Patient is here for shoe pick-up only. Patient took size 9 x-wide [place] home today. The size 9.5 wide will be sent back to [facility].

## 2021-12-07 DIAGNOSIS — E1122 Type 2 diabetes mellitus with diabetic chronic kidney disease: Secondary | ICD-10-CM | POA: Diagnosis not present

## 2021-12-07 DIAGNOSIS — G894 Chronic pain syndrome: Secondary | ICD-10-CM | POA: Diagnosis not present

## 2021-12-07 DIAGNOSIS — M5136 Other intervertebral disc degeneration, lumbar region: Secondary | ICD-10-CM | POA: Diagnosis not present

## 2021-12-07 DIAGNOSIS — N1831 Chronic kidney disease, stage 3a: Secondary | ICD-10-CM | POA: Diagnosis not present

## 2021-12-07 DIAGNOSIS — E559 Vitamin D deficiency, unspecified: Secondary | ICD-10-CM | POA: Diagnosis not present

## 2021-12-07 DIAGNOSIS — I129 Hypertensive chronic kidney disease with stage 1 through stage 4 chronic kidney disease, or unspecified chronic kidney disease: Secondary | ICD-10-CM | POA: Diagnosis not present

## 2021-12-07 DIAGNOSIS — M7918 Myalgia, other site: Secondary | ICD-10-CM | POA: Diagnosis not present

## 2021-12-21 ENCOUNTER — Other Ambulatory Visit: Payer: Self-pay | Admitting: Family Medicine

## 2021-12-25 DIAGNOSIS — F419 Anxiety disorder, unspecified: Secondary | ICD-10-CM | POA: Diagnosis not present

## 2021-12-25 DIAGNOSIS — K219 Gastro-esophageal reflux disease without esophagitis: Secondary | ICD-10-CM | POA: Diagnosis not present

## 2021-12-25 DIAGNOSIS — K5904 Chronic idiopathic constipation: Secondary | ICD-10-CM | POA: Diagnosis not present

## 2021-12-25 DIAGNOSIS — Z1211 Encounter for screening for malignant neoplasm of colon: Secondary | ICD-10-CM | POA: Diagnosis not present

## 2021-12-25 DIAGNOSIS — E782 Mixed hyperlipidemia: Secondary | ICD-10-CM | POA: Diagnosis not present

## 2021-12-25 DIAGNOSIS — Z86010 Personal history of colonic polyps: Secondary | ICD-10-CM | POA: Diagnosis not present

## 2021-12-25 DIAGNOSIS — I10 Essential (primary) hypertension: Secondary | ICD-10-CM | POA: Diagnosis not present

## 2021-12-31 ENCOUNTER — Other Ambulatory Visit: Payer: Self-pay | Admitting: Family Medicine

## 2021-12-31 DIAGNOSIS — J3089 Other allergic rhinitis: Secondary | ICD-10-CM

## 2021-12-31 DIAGNOSIS — S63512A Sprain of carpal joint of left wrist, initial encounter: Secondary | ICD-10-CM | POA: Diagnosis not present

## 2021-12-31 DIAGNOSIS — G5602 Carpal tunnel syndrome, left upper limb: Secondary | ICD-10-CM | POA: Diagnosis not present

## 2021-12-31 DIAGNOSIS — Z0389 Encounter for observation for other suspected diseases and conditions ruled out: Secondary | ICD-10-CM | POA: Diagnosis not present

## 2021-12-31 DIAGNOSIS — M25532 Pain in left wrist: Secondary | ICD-10-CM | POA: Diagnosis not present

## 2022-01-02 DIAGNOSIS — M25532 Pain in left wrist: Secondary | ICD-10-CM | POA: Diagnosis not present

## 2022-01-02 DIAGNOSIS — M1812 Unilateral primary osteoarthritis of first carpometacarpal joint, left hand: Secondary | ICD-10-CM | POA: Diagnosis not present

## 2022-01-25 DIAGNOSIS — G894 Chronic pain syndrome: Secondary | ICD-10-CM | POA: Diagnosis not present

## 2022-01-30 ENCOUNTER — Ambulatory Visit (INDEPENDENT_AMBULATORY_CARE_PROVIDER_SITE_OTHER): Payer: Medicare HMO | Admitting: Family Medicine

## 2022-01-30 ENCOUNTER — Telehealth: Payer: Self-pay

## 2022-01-30 VITALS — BP 106/60 | Temp 96.0°F

## 2022-01-30 DIAGNOSIS — J208 Acute bronchitis due to other specified organisms: Secondary | ICD-10-CM

## 2022-01-30 DIAGNOSIS — J018 Other acute sinusitis: Secondary | ICD-10-CM

## 2022-01-30 MED ORDER — AZITHROMYCIN 250 MG PO TABS: ORAL_TABLET | ORAL | 0 refills | Status: DC

## 2022-01-30 MED ORDER — PREDNISONE 50 MG PO TABS: 50.0000 mg | ORAL_TABLET | Freq: Every day | ORAL | 0 refills | Status: DC

## 2022-01-30 NOTE — Progress Notes (Signed)
Virtual Visit via Video Note   This visit type was conducted due to national recommendations for restrictions regarding the COVID-19 Pandemic (e.g. social distancing) in an effort to limit this patient's exposure and mitigate transmission in our community.  Due to her co-morbid illnesses, this patient is at least at moderate risk for complications without adequate follow up.  This format is felt to be most appropriate for this patient at this time.  All issues noted in this document were discussed and addressed.  A limited physical exam was performed with this format.  A verbal consent was obtained for the virtual visit.   Date:  02/03/2022   ID:  Andrea White, DOB February 16, 1955, MRN [id number]  Patient Location: Home Provider Location: Office/Clinic  PCP:  [name], MD   Evaluation Performed:  sick visit  Chief Complaint:  cough  History of Present Illness:    Andrea White is a 67 y.o. female with cough, runny nose, nasal congestion x 1 1/2 weeks. Husband was hospitalized for rsv. Complaining of right earache. No shortness of breath, fever, chills, sweats, or sore throat.   The patient does have symptoms concerning for COVID-19 infection (fever, chills, cough, or new shortness of breath).    Past Medical History:  Diagnosis Date   Allergy    Anxiety    Arthritis    Bradycardia, unspecified    Bruises easily    Calf pain    when walking   Fibromyalgia    Frequent headaches    GERD (gastroesophageal reflux disease)    H/O bladder problems    Hypertension    IBS (irritable bowel syndrome)    Localized edema    Mild persistent asthma, uncomplicated    Morbid (severe) obesity due to excess calories (HCC)    Muscle pain    Myocardial infarction ([place])    Other hypotension    Ovarian cyst    Slow transit constipation    Solitary pulmonary nodule    Swelling    feet and legs   Type 2 diabetes mellitus with diabetic nephropathy (HCC)    Varicose veins of bilateral  lower extremities with pain     Past Surgical History:  Procedure Laterality Date   BUNIONECTOMY     left foot 5th toe   CARDIAC CATHETERIZATION     carpel tunnel surgery     CESAREAN SECTION     CHOLECYSTECTOMY     GALLBLADDER SURGERY     TONSILLECTOMY     TUBAL LIGATION     VAGINAL HYSTERECTOMY  2003   LAVH/SR    Family History  Problem Relation Age of Onset   Heart disease Father    Hypertension Father    Stroke Father    Heart attack Father    Cerebrovascular Accident Father    Diabetes Sister    Kidney disease Sister    Migraines Daughter    Breast cancer Maternal Aunt    Cancer Maternal Aunt 74       breast    Social History   Socioeconomic History   Marital status: Married    Spouse name: Not on file   Number of children: 1   Years of education: Not on file   Highest education level: Not on file  Occupational History   Occupation: Disabled due to foot problems since 1993  Tobacco Use   Smoking status: Never   Smokeless tobacco: Never  Vaping Use   Vaping Use: Never used  Substance and  Sexual Activity   Alcohol use: No   Drug use: No   Sexual activity: Yes    Birth control/protection: Surgical    Comment: hyst  Other Topics Concern   Not on file  Social History Narrative   Not on file   Social Determinants of Health   Financial Resource Strain: Not on file  Food Insecurity: No Food Insecurity (06/06/2021)   Hunger Vital Sign    Worried About Running Out of Food in the Last Year: Never true    Ran Out of Food in the Last Year: Never true  Transportation Needs: No Transportation Needs (06/06/2021)   PRAPARE - Hydrologist (Medical): No    Lack of Transportation (Non-Medical): No  Physical Activity: Insufficiently Active (05/30/2020)   Exercise Vital Sign    Days of Exercise per Week: 4 days    Minutes of Exercise per Session: 30 min  Stress: No Stress Concern Present (06/06/2021)   [place]    Feeling of Stress : Only a little  Social Connections: Not on file  Intimate Partner Violence: Not At Risk (06/06/2021)   Humiliation, Afraid, Rape, and Kick questionnaire    Fear of Current or Ex-Partner: No    Emotionally Abused: No    Physically Abused: No    Sexually Abused: No    Outpatient Medications Prior to Visit  Medication Sig Dispense Refill   albuterol (VENTOLIN HFA) 108 (90 Base) MCG/ACT inhaler INHALE 2 PUFFS INTO THE LUNGS EVERY 6 (SIX) HOURS AS NEEDED FOR WHEEZING OR SHORTNESS OF BREATH. 1 each 3   ALPRAZolam (XANAX) 0.5 MG tablet TAKE 1/2 TO 1 TABLET EVERY 8 HOURS AS NEEDED 30 tablet 2   amoxicillin-clavulanate (AUGMENTIN) 875-125 MG tablet Take 1 tablet by mouth 2 (two) times daily. 14 tablet 0   APPLE CIDER VINEGAR PO Take by mouth daily.     Biotin 10000 MCG TABS Take 1 tablet by mouth daily.     Blood Glucose Monitoring Suppl (TRUE METRIX METER) w/Device KIT USE AS DIRECTED 1 kit 0   diclofenac Sodium (VOLTAREN) 1 % GEL Apply 4 g topically 4 (four) times daily as needed (knees, foot, or hand pain). 1000 g 3   DULoxetine (CYMBALTA) 60 MG capsule Take 1 capsule (60 mg total) by mouth daily. 90 capsule 1   empagliflozin (JARDIANCE) 10 MG TABS tablet Take 1 tablet (10 mg total) by mouth daily before breakfast. 90 tablet 0   hydrochlorothiazide (MICROZIDE) 12.5 MG capsule TAKE 1 CAPSULE EVERY DAY 90 capsule 1   loratadine (CLARITIN) 10 MG tablet TAKE 1 TABLET EVERY DAY 90 tablet 3   meclizine (ANTIVERT) 25 MG tablet TAKE 1 TABLET TWICE DAILY 180 tablet 1   Multiple Vitamin (MULTIVITAMIN) tablet Take 1 tablet by mouth daily.     omega-3 acid ethyl esters (LOVAZA) 1 g capsule TAKE 2 CAPSULES TWICE DAILY 360 capsule 3   omeprazole (PRILOSEC) 40 MG capsule TAKE 1 CAPSULE TWICE DAILY 180 capsule 1   oxybutynin (DITROPAN) 5 MG tablet Take 1 tablet (5 mg total) by mouth 3 (three) times daily. 270 tablet 0   pravastatin (PRAVACHOL) 20  MG tablet TAKE 1 TABLET EVERY DAY 90 tablet 0   tiZANidine (ZANAFLEX) 2 MG tablet Take 1 tablet (2 mg total) by mouth every 8 (eight) hours as needed for muscle spasms. 50 tablet 0   traMADol (ULTRAM) 50 MG tablet Take 1 tablet (50 mg total)  by mouth 3 (three) times daily as needed. 90 tablet 1   TRUE METRIX BLOOD GLUCOSE TEST test strip USE AS DIRECTED TO CHECK FASTING BLOOD SUGAR. 300 strip 2   TRUEplus Lancets 33G MISC E11.69 use new lancet each time when checking FBS 100 each 2   valsartan (DIOVAN) 80 MG tablet Take 1 tablet (80 mg total) by mouth daily. 90 tablet 3   No facility-administered medications prior to visit.    Allergies:   Morphine and related, Celebrex [celecoxib], Fenofibrate, Gabapentin, Lisinopril, Guaifenesin & derivatives, Lipitor [atorvastatin], Sulfa antibiotics, and Valium [diazepam]   Social History   Tobacco Use   Smoking status: Never   Smokeless tobacco: Never  Vaping Use   Vaping Use: Never used  Substance Use Topics   Alcohol use: No   Drug use: No     Review of Systems  Constitutional:  Positive for malaise/fatigue. Negative for chills and fever.  HENT:  Positive for congestion and ear pain (rt).   Respiratory:  Positive for cough. Negative for shortness of breath.   Cardiovascular:  Negative for chest pain.     Labs/Other Tests and Data Reviewed:    Recent Labs: 10/04/2021: ALT 11; BUN 14; Creatinine, Ser 1.23; Hemoglobin 13.7; Platelets 401; Potassium 4.1; Sodium 141; TSH 2.220   Recent Lipid Panel Lab Results  Component Value Date/Time   CHOL 169 10/04/2021 08:22 AM   TRIG 292 (H) 10/04/2021 08:22 AM   HDL 42 10/04/2021 08:22 AM   CHOLHDL 4.0 10/04/2021 08:22 AM   LDLCALC 79 10/04/2021 08:22 AM    Wt Readings from Last 3 Encounters:  11/20/21 195 lb (88.5 kg)  10/04/21 194 lb 9.6 oz (88.3 kg)  06/06/21 196 lb 12.8 oz (89.3 kg)     Objective:    Vital Signs:  BP 106/60   Temp (!) 96 F (35.6 C)   SpO2 99%    Physical Exam    ASSESSMENT & PLAN:    Problem List Items Addressed This Visit   None Visit Diagnoses     Acute bronchitis due to other specified organisms    -  Primary   Relevant Medications   azithromycin (ZITHROMAX) 250 MG tablet   Acute non-recurrent sinusitis of other sinus       Relevant Medications   azithromycin (ZITHROMAX) 250 MG tablet   predniSONE (DELTASONE) 50 MG tablet     .  No orders of the defined types were placed in this encounter.    Meds ordered this encounter  Medications   azithromycin (ZITHROMAX) 250 MG tablet    Sig: 2 DAILY FOR FIRST DAY, THEN DECREASE TO ONE DAILY FOR 4 MORE DAYS.    Dispense:  6 tablet    Refill:  0   predniSONE (DELTASONE) 50 MG tablet    Sig: Take 1 tablet (50 mg total) by mouth daily with breakfast.    Dispense:  5 tablet    Refill:  0    COVID-19 Education: The signs and symptoms of COVID-19 were discussed with the patient and how to seek care for testing (follow up with PCP or arrange E-visit). The importance of social distancing was discussed today.   I spent 10 minutes dedicated to the care of this patient on the date of this encounter Follow Up:  In Person prn  Signed, [name], MD  02/03/2022 1:57 PM    [place]

## 2022-01-30 NOTE — Telephone Encounter (Signed)
Patient called in stating that her husband has covid and now she has the symptoms as well.  She was wondering if you could send in prednisone and Zpac or does she need an appointment? Pleas advise

## 2022-01-31 NOTE — Telephone Encounter (Signed)
Patient seen. Dr. Tobie Poet

## 2022-02-03 ENCOUNTER — Encounter: Payer: Self-pay | Admitting: Family Medicine

## 2022-02-03 NOTE — Progress Notes (Signed)
Subjective:  Patient ID: Andrea White, female    DOB: 05/06/1955  Age: 67 y.o. MRN: [id number]  Chief Complaint  Patient presents with   Diabetes   Hypertension    HPI Diabetes:  Complications: neuropathy. Glucose checking: some days Glucose logs: Range 120s Hypoglycemia: none Most recent A1C: 6.9 Current medications: Jardiance 10 mg once daily.  Foot checks: daily.  Hyperlipidemia: Current medications: Pravastatin 20 mg daily, Lovaza 2g twice a day  Hypertension: Current medications: hydrochlorothiazide 12.5 mg once daily and Valsartan 80 mg daily.  GERD: on omeprazole 40 mg one oral twice daily.   Urge incontinence: oxybutynin 5 mg three times a day.   FM: on duloxetine 60 mg before bed.   Chronic Back Pain: Takes tramadol three times a day. Using diclofenac gel.   Allergic rhinitis: claritin.  Asthma: on albuterol 2 puffs   Diet: eats what she wants, but this is fruits, vegetables, meat in the evening. Avoids fried foods and refined sugars.  Exercise:  Gardening.   Current Outpatient Medications on File Prior to Visit  Medication Sig Dispense Refill   albuterol (VENTOLIN HFA) 108 (90 Base) MCG/ACT inhaler INHALE 2 PUFFS INTO THE LUNGS EVERY 6 (SIX) HOURS AS NEEDED FOR WHEEZING OR SHORTNESS OF BREATH. 1 each 3   ALPRAZolam (XANAX) 0.5 MG tablet TAKE 1/2 TO 1 TABLET EVERY 8 HOURS AS NEEDED 30 tablet 2   APPLE CIDER VINEGAR PO Take by mouth daily.     Biotin 10000 MCG TABS Take 1 tablet by mouth daily.     Blood Glucose Monitoring Suppl (TRUE METRIX METER) w/Device KIT USE AS DIRECTED 1 kit 0   diclofenac Sodium (VOLTAREN) 1 % GEL Apply 4 g topically 4 (four) times daily as needed (knees, foot, or hand pain). 1000 g 3   DULoxetine (CYMBALTA) 60 MG capsule Take 1 capsule (60 mg total) by mouth daily. 90 capsule 1   empagliflozin (JARDIANCE) 10 MG TABS tablet Take 1 tablet (10 mg total) by mouth daily before breakfast. 90 tablet 0   hydrochlorothiazide (MICROZIDE)  12.5 MG capsule TAKE 1 CAPSULE EVERY DAY 90 capsule 1   loratadine (CLARITIN) 10 MG tablet TAKE 1 TABLET EVERY DAY 90 tablet 3   meclizine (ANTIVERT) 25 MG tablet TAKE 1 TABLET TWICE DAILY 180 tablet 1   Multiple Vitamin (MULTIVITAMIN) tablet Take 1 tablet by mouth daily.     omega-3 acid ethyl esters (LOVAZA) 1 g capsule TAKE 2 CAPSULES TWICE DAILY 360 capsule 3   omeprazole (PRILOSEC) 40 MG capsule TAKE 1 CAPSULE TWICE DAILY 180 capsule 1   oxybutynin (DITROPAN) 5 MG tablet Take 1 tablet (5 mg total) by mouth 3 (three) times daily. 270 tablet 0   pravastatin (PRAVACHOL) 20 MG tablet TAKE 1 TABLET EVERY DAY 90 tablet 0   predniSONE (DELTASONE) 50 MG tablet Take 1 tablet (50 mg total) by mouth daily with breakfast. 5 tablet 0   tiZANidine (ZANAFLEX) 2 MG tablet Take 1 tablet (2 mg total) by mouth every 8 (eight) hours as needed for muscle spasms. 50 tablet 0   traMADol (ULTRAM) 50 MG tablet Take 1 tablet (50 mg total) by mouth 3 (three) times daily as needed. 90 tablet 1   TRUE METRIX BLOOD GLUCOSE TEST test strip USE AS DIRECTED TO CHECK FASTING BLOOD SUGAR. 300 strip 2   TRUEplus Lancets 33G MISC E11.69 use new lancet each time when checking FBS 100 each 2   valsartan (DIOVAN) 80 MG tablet Take 1 tablet (  80 mg total) by mouth daily. 90 tablet 3   No current facility-administered medications on file prior to visit.   Past Medical History:  Diagnosis Date   Allergy    Anxiety    Arthritis    Bradycardia, unspecified    Bruises easily    Calf pain    when walking   Fibromyalgia    Frequent headaches    GERD (gastroesophageal reflux disease)    H/O bladder problems    Hypertension    IBS (irritable bowel syndrome)    Localized edema    Mild persistent asthma, uncomplicated    Morbid (severe) obesity due to excess calories (HCC)    Muscle pain    Myocardial infarction (HCC)    Other hypotension    Ovarian cyst    Slow transit constipation    Solitary pulmonary nodule    Swelling     feet and legs   Type 2 diabetes mellitus with diabetic nephropathy (HCC)    Varicose veins of bilateral lower extremities with pain    Past Surgical History:  Procedure Laterality Date   BUNIONECTOMY     left foot 5th toe   CARDIAC CATHETERIZATION     carpel tunnel surgery     CESAREAN SECTION     CHOLECYSTECTOMY     GALLBLADDER SURGERY     TONSILLECTOMY     TUBAL LIGATION     VAGINAL HYSTERECTOMY  2003   LAVH/SR    Family History  Problem Relation Age of Onset   Heart disease Father    Hypertension Father    Stroke Father    Heart attack Father    Cerebrovascular Accident Father    Diabetes Sister    Kidney disease Sister    Migraines Daughter    Breast cancer Maternal Aunt    Cancer Maternal Aunt 74       breast   Social History   Socioeconomic History   Marital status: Married    Spouse name: Not on file   Number of children: 1   Years of education: Not on file   Highest education level: Not on file  Occupational History   Occupation: Disabled due to foot problems since 1993  Tobacco Use   Smoking status: Never   Smokeless tobacco: Never  Vaping Use   Vaping Use: Never used  Substance and Sexual Activity   Alcohol use: No   Drug use: No   Sexual activity: Yes    Birth control/protection: Surgical    Comment: hyst  Other Topics Concern   Not on file  Social History Narrative   Not on file   Social Determinants of Health   Financial Resource Strain: Not on file  Food Insecurity: No Food Insecurity (06/06/2021)   Hunger Vital Sign    Worried About Running Out of Food in the Last Year: Never true    Ran Out of Food in the Last Year: Never true  Transportation Needs: No Transportation Needs (06/06/2021)   PRAPARE - Hydrologist (Medical): No    Lack of Transportation (Non-Medical): No  Physical Activity: Insufficiently Active (05/30/2020)   Exercise Vital Sign    Days of Exercise per Week: 4 days    Minutes of Exercise  per Session: 30 min  Stress: No Stress Concern Present (06/06/2021)   [place]    Feeling of Stress : Only a little  Social Connections: Not on file  Review of Systems  Constitutional:  Negative for chills, fatigue and fever.  HENT:  Positive for congestion. Negative for ear pain and sore throat.   Respiratory:  Positive for cough. Negative for shortness of breath.   Cardiovascular:  Negative for chest pain and palpitations.  Gastrointestinal:  Negative for abdominal pain, constipation, diarrhea, nausea and vomiting.  Endocrine: Negative for polydipsia, polyphagia and polyuria.  Genitourinary:  Negative for difficulty urinating and dysuria.  Musculoskeletal:  Negative for arthralgias, back pain and myalgias.  Skin:  Negative for rash.  Neurological:  Negative for headaches.  Psychiatric/Behavioral:  Negative for dysphoric mood. The patient is not nervous/anxious.      Objective:  BP 118/86   Pulse 70   Temp 97.8 F (36.6 C)   Resp 15   Ht '5\' 2"'$  (1.575 m)   Wt 195 lb (88.5 kg)   SpO2 98%   BMI 35.67 kg/m      02/04/2022    8:33 AM 02/04/2022    8:15 AM 02/03/2022    1:52 PM  BP/Weight  Systolic BP 175 102 585  Diastolic BP 86 94 60  Wt. (Lbs)  195   BMI  35.67 kg/m2     Physical Exam Vitals reviewed.  Constitutional:      Appearance: Normal appearance.  HENT:     Right Ear: Tympanic membrane normal.     Left Ear: Tympanic membrane normal.     Nose: Nose normal.     Mouth/Throat:     Pharynx: No oropharyngeal exudate or posterior oropharyngeal erythema.     Comments: Cold sore lower right lip. Neck:     Vascular: No carotid bruit.  Cardiovascular:     Rate and Rhythm: Normal rate and regular rhythm.     Pulses: Normal pulses.     Heart sounds: Normal heart sounds.  Pulmonary:     Effort: Pulmonary effort is normal.     Breath sounds: Normal breath sounds.  Abdominal:     General: Bowel  sounds are normal.     Palpations: Abdomen is soft.     Tenderness: There is no abdominal tenderness.  Lymphadenopathy:     Cervical: No cervical adenopathy.  Skin:    Findings: No lesion or rash.  Neurological:     Mental Status: She is alert and oriented to person, place, and time.  Psychiatric:        Mood and Affect: Mood normal.        Behavior: Behavior normal.     Diabetic Foot Exam - Simple   Simple Foot Form Diabetic Foot exam was performed with the following findings: Yes 02/04/2022  8:56 AM  Visual Inspection See comments: Yes Sensation Testing Intact to touch and monofilament testing bilaterally: Yes Pulse Check Posterior Tibialis and Dorsalis pulse intact bilaterally: Yes Comments Pes planus BL.       Lab Results  Component Value Date   WBC 11.8 (H) 02/04/2022   HGB 14.6 02/04/2022   HCT 44.4 02/04/2022   PLT 477 (H) 02/04/2022   GLUCOSE 92 02/04/2022   CHOL 181 02/04/2022   TRIG 167 (H) 02/04/2022   HDL 65 02/04/2022   LDLCALC 88 02/04/2022   ALT 22 02/04/2022   AST 24 02/04/2022   NA 144 02/04/2022   K 4.1 02/04/2022   CL 100 02/04/2022   CREATININE 1.25 (H) 02/04/2022   BUN 21 02/04/2022   CO2 25 02/04/2022   TSH 2.220 10/04/2021   HGBA1C 6.7 (H) 02/04/2022  MICROALBUR 80 09/05/2020      Assessment & Plan:   Akasia was seen today for diabetes and hypertension.  Hypertensive heart and kidney disease with acute combined systolic and diastolic congestive heart failure and stage 3b chronic kidney disease ([place]) Assessment & Plan: Well controlled.  No changes to medicines. hydrochlorothiazide 12.5 mg once daily and Valsartan 80 mg daily. Continue to work on eating a healthy diet and exercise.  Labs drawn today.    Orders: -     Comprehensive metabolic panel -     CBC with Differential/Platelet  Gastroesophageal reflux disease without esophagitis Assessment & Plan: The current medical regimen is effective;  continue present plan and  medications. omeprazole 40 mg one oral twice daily.    Diabetic polyneuropathy associated with type 2 diabetes mellitus ([place]) Assessment & Plan: Control: Controlled Recommend check sugars fasting daily. Recommend check feet daily. Recommend annual eye exams. Medicines: Jardiance 10 mg once daily.  Continue to work on eating a healthy diet and exercise.  Labs drawn today.     Orders: -     Hemoglobin A1c -     Microalbumin / creatinine urine ratio  Primary fibromyalgia syndrome Assessment & Plan: The current medical regimen is effective;  continue present plan and medications.    Mixed hyperlipidemia Assessment & Plan: Well controlled.  No changes to medicines. Pravastatin 20 mg daily, Lovaza 2g twice a day Continue to work on eating a healthy diet and exercise.  Labs drawn today.    Orders: -     Lipid panel -     Cardiovascular Risk Assessment  Screen for colon cancer -     Ambulatory referral to Gastroenterology  Herpes labialis Assessment & Plan: Sent Valtrex.  Orders: -     valACYclovir HCl; Take 2 tablets (2,000 mg total) by mouth 2 (two) times daily as needed (x 1 day for each break out).  Dispense: 20 tablet; Refill: 0     Meds ordered this encounter  Medications   valACYclovir (VALTREX) 1000 MG tablet    Sig: Take 2 tablets (2,000 mg total) by mouth 2 (two) times daily as needed (x 1 day for each break out).    Dispense:  20 tablet    Refill:  0    Orders Placed This Encounter  Procedures   Comprehensive metabolic panel   Hemoglobin A1c   Lipid panel   CBC with Differential/Platelet   Microalbumin / creatinine urine ratio   Cardiovascular Risk Assessment   Ambulatory referral to Gastroenterology     Follow-up: Return in about 3 months (around 05/06/2022) for chronic fasting.  An After Visit Summary was printed and given to the patient.  [name], MD [facility] [phone]

## 2022-02-04 ENCOUNTER — Ambulatory Visit (INDEPENDENT_AMBULATORY_CARE_PROVIDER_SITE_OTHER): Payer: Medicare HMO | Admitting: Family Medicine

## 2022-02-04 ENCOUNTER — Encounter: Payer: Self-pay | Admitting: Family Medicine

## 2022-02-04 VITALS — BP 118/86 | HR 70 | Temp 97.8°F | Resp 15 | Ht 62.0 in | Wt 195.0 lb

## 2022-02-04 DIAGNOSIS — M797 Fibromyalgia: Secondary | ICD-10-CM

## 2022-02-04 DIAGNOSIS — E782 Mixed hyperlipidemia: Secondary | ICD-10-CM

## 2022-02-04 DIAGNOSIS — Z1211 Encounter for screening for malignant neoplasm of colon: Secondary | ICD-10-CM | POA: Diagnosis not present

## 2022-02-04 DIAGNOSIS — N1832 Chronic kidney disease, stage 3b: Secondary | ICD-10-CM | POA: Diagnosis not present

## 2022-02-04 DIAGNOSIS — E1142 Type 2 diabetes mellitus with diabetic polyneuropathy: Secondary | ICD-10-CM | POA: Diagnosis not present

## 2022-02-04 DIAGNOSIS — I5041 Acute combined systolic (congestive) and diastolic (congestive) heart failure: Secondary | ICD-10-CM | POA: Diagnosis not present

## 2022-02-04 DIAGNOSIS — B001 Herpesviral vesicular dermatitis: Secondary | ICD-10-CM

## 2022-02-04 DIAGNOSIS — I130 Hypertensive heart and chronic kidney disease with heart failure and stage 1 through stage 4 chronic kidney disease, or unspecified chronic kidney disease: Secondary | ICD-10-CM

## 2022-02-04 DIAGNOSIS — K219 Gastro-esophageal reflux disease without esophagitis: Secondary | ICD-10-CM

## 2022-02-04 MED ORDER — VALACYCLOVIR HCL 1 G PO TABS: 2000.0000 mg | ORAL_TABLET | Freq: Two times a day (BID) | ORAL | 0 refills | Status: AC | PRN

## 2022-02-05 LAB — CBC WITH DIFFERENTIAL/PLATELET
Basophils Absolute: 0.1 10*3/uL (ref 0.0–0.2)
Basos: 1 %
EOS (ABSOLUTE): 0 10*3/uL (ref 0.0–0.4)
Eos: 0 %
Hematocrit: 44.4 % (ref 34.0–46.6)
Hemoglobin: 14.6 g/dL (ref 11.1–15.9)
Immature Grans (Abs): 0.1 10*3/uL (ref 0.0–0.1)
Immature Granulocytes: 1 %
Lymphocytes Absolute: 4.4 10*3/uL — ABNORMAL HIGH (ref 0.7–3.1)
Lymphs: 37 %
MCH: 27.7 pg (ref 26.6–33.0)
MCHC: 32.9 g/dL (ref 31.5–35.7)
MCV: 84 fL (ref 79–97)
Monocytes Absolute: 0.9 10*3/uL (ref 0.1–0.9)
Monocytes: 8 %
Neutrophils Absolute: 6.2 10*3/uL (ref 1.4–7.0)
Neutrophils: 53 %
Platelets: 477 10*3/uL — ABNORMAL HIGH (ref 150–450)
RBC: 5.28 x10E6/uL (ref 3.77–5.28)
RDW: 14.4 % (ref 11.7–15.4)
WBC: 11.8 10*3/uL — ABNORMAL HIGH (ref 3.4–10.8)

## 2022-02-05 LAB — MICROALBUMIN / CREATININE URINE RATIO
Creatinine, Urine: 194.8 mg/dL
Microalb/Creat Ratio: 7 mg/g creat (ref 0–29)
Microalbumin, Urine: 13.9 ug/mL

## 2022-02-05 LAB — COMPREHENSIVE METABOLIC PANEL
ALT: 22 IU/L (ref 0–32)
AST: 24 IU/L (ref 0–40)
Albumin/Globulin Ratio: 1.8 (ref 1.2–2.2)
Albumin: 4.5 g/dL (ref 3.9–4.9)
Alkaline Phosphatase: 112 IU/L (ref 44–121)
BUN/Creatinine Ratio: 17 (ref 12–28)
BUN: 21 mg/dL (ref 8–27)
Bilirubin Total: 0.7 mg/dL (ref 0.0–1.2)
CO2: 25 mmol/L (ref 20–29)
Calcium: 9.6 mg/dL (ref 8.7–10.3)
Chloride: 100 mmol/L (ref 96–106)
Creatinine, Ser: 1.25 mg/dL — ABNORMAL HIGH (ref 0.57–1.00)
Globulin, Total: 2.5 g/dL (ref 1.5–4.5)
Glucose: 92 mg/dL (ref 70–99)
Potassium: 4.1 mmol/L (ref 3.5–5.2)
Sodium: 144 mmol/L (ref 134–144)
Total Protein: 7 g/dL (ref 6.0–8.5)
eGFR: 48 mL/min/{1.73_m2} — ABNORMAL LOW (ref >59)

## 2022-02-05 LAB — LIPID PANEL
Chol/HDL Ratio: 2.8 ratio (ref 0.0–4.4)
Cholesterol, Total: 181 mg/dL (ref 100–199)
HDL: 65 mg/dL (ref >39)
LDL Chol Calc (NIH): 88 mg/dL (ref 0–99)
Triglycerides: 167 mg/dL — ABNORMAL HIGH (ref 0–149)
VLDL Cholesterol Cal: 28 mg/dL (ref 5–40)

## 2022-02-05 LAB — CARDIOVASCULAR RISK ASSESSMENT

## 2022-02-05 LAB — HEMOGLOBIN A1C
Est. average glucose Bld gHb Est-mCnc: 146 mg/dL
Hgb A1c MFr Bld: 6.7 % — ABNORMAL HIGH (ref 4.8–5.6)

## 2022-02-06 NOTE — Progress Notes (Signed)
Blood count abnormal. Wbc little up. Platelets little up. Patient is sick and on prednisone. Liver function normal.  Kidney function stable.  Thyroid function normal.  Cholesterol: trigs improved from 292 down to 167. Rest good.  HBA1C: 6.7. improved.  Not spilling protein.

## 2022-02-09 ENCOUNTER — Other Ambulatory Visit: Payer: Self-pay | Admitting: Family Medicine

## 2022-02-09 DIAGNOSIS — J208 Acute bronchitis due to other specified organisms: Secondary | ICD-10-CM

## 2022-02-09 NOTE — Assessment & Plan Note (Signed)
Well controlled.  No changes to medicines. hydrochlorothiazide 12.5 mg once daily and Valsartan 80 mg daily. Continue to work on eating a healthy diet and exercise.  Labs drawn today.

## 2022-02-09 NOTE — Assessment & Plan Note (Signed)
The current medical regimen is effective;  continue present plan and medications.  

## 2022-02-09 NOTE — Assessment & Plan Note (Signed)
Control: Controlled Recommend check sugars fasting daily. Recommend check feet daily. Recommend annual eye exams. Medicines: Jardiance 10 mg once daily.  Continue to work on eating a healthy diet and exercise.  Labs drawn today.

## 2022-02-09 NOTE — Assessment & Plan Note (Signed)
The current medical regimen is effective;  continue present plan and medications. omeprazole 40 mg one oral twice daily.

## 2022-02-09 NOTE — Assessment & Plan Note (Signed)
Sent Valtrex.

## 2022-02-09 NOTE — Assessment & Plan Note (Signed)
Well controlled.  No changes to medicines. Pravastatin 20 mg daily, Lovaza 2g twice a day Continue to work on eating a healthy diet and exercise.  Labs drawn today.

## 2022-02-11 MED ORDER — PREDNISONE 50 MG PO TABS: 50.0000 mg | ORAL_TABLET | Freq: Every day | ORAL | 0 refills | Status: DC

## 2022-02-26 ENCOUNTER — Telehealth: Payer: Self-pay

## 2022-02-26 NOTE — Progress Notes (Signed)
Care Management & Coordination Services Pharmacy Team  Reason for Encounter: General adherence update   Contacted patient for general health update and medication adherence call.  Spoke with patient on 02/26/2022    What concerns do you have about your medications? Patient stated she has switched from [place] to [place] and needs all medications transferred to [facility] Sent message to pool for transfer. Patient stated she she has about a weeks worth on some medications due to not taking every day. Asked patient were there any barriers preventing her from taking medications and she stated "no I just forget some days".   The patient denies side effects with their medications.   How often do you forget or accidentally miss a dose? Once a week  Do you use a pillbox? Yes  Are you having any problems getting your medications from your pharmacy? Yes  Has the cost of your medications been a concern? No  Since last visit with PharmD, no interventions have been made.   The patient has not had an ED visit since last contact.   The patient denies problems with their health.   Patient denies concerns or questions for [name], PharmD at this time.   Counseled patient on: Importance of taking medication daily without missed doses   Chart Updates: Recent office visits:  02-04-2022 [name], MD. WBC= 11.8, Platelets= 477, Lymphocytes Absolute= 4.4. Creatinine= 1.25, eGFR= 48. A1C= 6.7. Trig= 167. Referral placed to gastro. START Valtrex 1,000 mg take 2 tablets twice daily PRN. Completed zithromax.  01-30-2022 Cox, Kirsten, MD. START zithromax 250 mg 2 DAILY FOR FIRST DAY, THEN DECREASE TO ONE DAILY FOR 4 MORE DAYS and prednisone 50 mg daily. Completed augmentin.   11-20-2021 [name], MD. Flu vaccine and Tdap given.  10-04-2021 Cox, [name], MD. Glucose= 113, Creatinine= 1.23, eGFR= 49, BUN/Creatinine Ratio= 11. A1C= 6.9. Trig= 292, VLDL= 48. Referral placed to gastro. STOP  fenofibrate. START tizanidine 2 mg every 8 hours PRN.     Recent consult visits:  12-25-2021 Juanita Craver [facility]). Visit for reflux.  12-07-2021 Claudia Desanctis (Nephrology). Unable to view encounter.  12-07-2021 Starling Manns (Orthopedic surgery). Unable to view encounter.  12-05-2021 Abdul-Mutakallim, Joycelyn Schmid, RN (Podiatry). Pick up diabetic shoes.  10-23-2021 Starling Manns (Orthopedic surgery). Unable to view encounter.   10-04-2021 Marzetta Board, DPM (Podiatry). Mycotic toenails 1-5 bilaterally were debrided in length and girth with sterile nail nippers and dremel without incident.    09-18-2021 Bronson Ing, DPM (Podiatry). Fitting and Delivery of Runner, broadcasting/film/video.   09-17-2021 Hester Mates (Optometry). Unable to view encounter.  Hospital visits:  None in previous 6 months  Medications: Outpatient Encounter Medications as of 02/26/2022  Medication Sig   albuterol (VENTOLIN HFA) 108 (90 Base) MCG/ACT inhaler INHALE 2 PUFFS INTO THE LUNGS EVERY 6 (SIX) HOURS AS NEEDED FOR WHEEZING OR SHORTNESS OF BREATH.   ALPRAZolam (XANAX) 0.5 MG tablet TAKE 1/2 TO 1 TABLET EVERY 8 HOURS AS NEEDED   APPLE CIDER VINEGAR PO Take by mouth daily.   Biotin 10000 MCG TABS Take 1 tablet by mouth daily.   Blood Glucose Monitoring Suppl (TRUE METRIX METER) w/Device KIT USE AS DIRECTED   diclofenac Sodium (VOLTAREN) 1 % GEL Apply 4 g topically 4 (four) times daily as needed (knees, foot, or hand pain).   DULoxetine (CYMBALTA) 60 MG capsule Take 1 capsule (60 mg total) by mouth daily.   empagliflozin (JARDIANCE) 10 MG TABS tablet Take 1 tablet (  10 mg total) by mouth daily before breakfast.   hydrochlorothiazide (MICROZIDE) 12.5 MG capsule TAKE 1 CAPSULE EVERY DAY   loratadine (CLARITIN) 10 MG tablet TAKE 1 TABLET EVERY DAY   meclizine (ANTIVERT) 25 MG tablet TAKE 1 TABLET TWICE DAILY   Multiple Vitamin (MULTIVITAMIN) tablet Take 1 tablet by mouth daily.   omega-3 acid ethyl  esters (LOVAZA) 1 g capsule TAKE 2 CAPSULES TWICE DAILY   omeprazole (PRILOSEC) 40 MG capsule TAKE 1 CAPSULE TWICE DAILY   oxybutynin (DITROPAN) 5 MG tablet Take 1 tablet (5 mg total) by mouth 3 (three) times daily.   pravastatin (PRAVACHOL) 20 MG tablet TAKE 1 TABLET EVERY DAY   predniSONE (DELTASONE) 50 MG tablet Take 1 tablet (50 mg total) by mouth daily with breakfast.   tiZANidine (ZANAFLEX) 2 MG tablet Take 1 tablet (2 mg total) by mouth every 8 (eight) hours as needed for muscle spasms.   traMADol (ULTRAM) 50 MG tablet Take 1 tablet (50 mg total) by mouth 3 (three) times daily as needed.   TRUE METRIX BLOOD GLUCOSE TEST test strip USE AS DIRECTED TO CHECK FASTING BLOOD SUGAR.   TRUEplus Lancets 33G MISC E11.69 use new lancet each time when checking FBS   valACYclovir (VALTREX) 1000 MG tablet Take 2 tablets (2,000 mg total) by mouth 2 (two) times daily as needed (x 1 day for each break out).   valsartan (DIOVAN) 80 MG tablet Take 1 tablet (80 mg total) by mouth daily.   No facility-administered encounter medications on file as of 02/26/2022.    Recent vitals BP Readings from Last 3 Encounters:  02/04/22 118/86  02/03/22 106/60  11/20/21 118/62   Pulse Readings from Last 3 Encounters:  02/04/22 70  11/20/21 81  10/04/21 70   Wt Readings from Last 3 Encounters:  02/04/22 195 lb (88.5 kg)  11/20/21 195 lb (88.5 kg)  10/04/21 194 lb 9.6 oz (88.3 kg)   BMI Readings from Last 3 Encounters:  02/04/22 35.67 kg/m  11/20/21 35.67 kg/m  10/04/21 35.59 kg/m    Recent lab results    Component Value Date/Time   NA 144 02/04/2022 0858   K 4.1 02/04/2022 0858   CL 100 02/04/2022 0858   CO2 25 02/04/2022 0858   GLUCOSE 92 02/04/2022 0858   GLUCOSE 91 12/26/2011 1349   BUN 21 02/04/2022 0858   CREATININE 1.25 (H) 02/04/2022 0858   CALCIUM 9.6 02/04/2022 0858    Lab Results  Component Value Date   CREATININE 1.25 (H) 02/04/2022   EGFR 48 (L) 02/04/2022   GFRNONAA 73  10/29/2019   GFRAA 84 10/29/2019   Lab Results  Component Value Date/Time   HGBA1C 6.7 (H) 02/04/2022 08:58 AM   HGBA1C 6.9 (H) 10/04/2021 08:22 AM   MICROALBUR 80 09/05/2020 04:42 PM   MICROALBUR 30 04/30/2019 12:32 PM    Lab Results  Component Value Date   CHOL 181 02/04/2022   HDL 65 02/04/2022   LDLCALC 88 02/04/2022   TRIG 167 (H) 02/04/2022   CHOLHDL 2.8 02/04/2022    Care Gaps: Annual wellness visit in last year? Yes Colonoscopy overdue Shingrix overdue Covid booster overdue  If Diabetic: Last eye exam / retinopathy screening: Patient stated 10-2021 Last diabetic foot exam: 12-05-2021 Last UACR: 02-04-2022  Star Rating Drugs:  Pravastatin 20 mg- Last filled 11-16-2021 90 DS. Previous 09-07-2021 90 DS.(Contacted [employer] and was told patient's coverage ended on 01-20-2022) Valsartan 80 mg- Last filled 10-03-2021 90 DS. Previous 07-26-2021 90 DS Jardiance  10 mg- Last filled 01-19-19-2023 90 DS. Previous 11-11-2021 90 DS  [place] [facility] Pharmacist Assistant [phone]

## 2022-02-28 ENCOUNTER — Other Ambulatory Visit: Payer: Self-pay

## 2022-03-03 DIAGNOSIS — R69 Illness, unspecified: Secondary | ICD-10-CM | POA: Diagnosis not present

## 2022-03-03 DIAGNOSIS — R32 Unspecified urinary incontinence: Secondary | ICD-10-CM | POA: Diagnosis not present

## 2022-03-03 DIAGNOSIS — E1122 Type 2 diabetes mellitus with diabetic chronic kidney disease: Secondary | ICD-10-CM | POA: Diagnosis not present

## 2022-03-03 DIAGNOSIS — Z8249 Family history of ischemic heart disease and other diseases of the circulatory system: Secondary | ICD-10-CM | POA: Diagnosis not present

## 2022-03-03 DIAGNOSIS — E785 Hyperlipidemia, unspecified: Secondary | ICD-10-CM | POA: Diagnosis not present

## 2022-03-03 DIAGNOSIS — I129 Hypertensive chronic kidney disease with stage 1 through stage 4 chronic kidney disease, or unspecified chronic kidney disease: Secondary | ICD-10-CM | POA: Diagnosis not present

## 2022-03-03 DIAGNOSIS — K5900 Constipation, unspecified: Secondary | ICD-10-CM | POA: Diagnosis not present

## 2022-03-03 DIAGNOSIS — E1142 Type 2 diabetes mellitus with diabetic polyneuropathy: Secondary | ICD-10-CM | POA: Diagnosis not present

## 2022-03-03 DIAGNOSIS — K219 Gastro-esophageal reflux disease without esophagitis: Secondary | ICD-10-CM | POA: Diagnosis not present

## 2022-03-03 DIAGNOSIS — N1831 Chronic kidney disease, stage 3a: Secondary | ICD-10-CM | POA: Diagnosis not present

## 2022-03-03 DIAGNOSIS — Z008 Encounter for other general examination: Secondary | ICD-10-CM | POA: Diagnosis not present

## 2022-03-03 DIAGNOSIS — Z882 Allergy status to sulfonamides status: Secondary | ICD-10-CM | POA: Diagnosis not present

## 2022-03-04 ENCOUNTER — Other Ambulatory Visit: Payer: Self-pay

## 2022-03-04 MED ORDER — ALPRAZOLAM 0.5 MG PO TABS: ORAL_TABLET | ORAL | 2 refills | Status: DC

## 2022-03-06 ENCOUNTER — Telehealth: Payer: Self-pay

## 2022-03-06 ENCOUNTER — Ambulatory Visit: Payer: Medicare HMO | Admitting: Podiatry

## 2022-03-06 ENCOUNTER — Other Ambulatory Visit: Payer: Self-pay

## 2022-03-06 ENCOUNTER — Encounter: Payer: Self-pay | Admitting: Podiatry

## 2022-03-06 DIAGNOSIS — M2141 Flat foot [pes planus] (acquired), right foot: Secondary | ICD-10-CM

## 2022-03-06 DIAGNOSIS — M19071 Primary osteoarthritis, right ankle and foot: Secondary | ICD-10-CM

## 2022-03-06 DIAGNOSIS — M778 Other enthesopathies, not elsewhere classified: Secondary | ICD-10-CM

## 2022-03-06 DIAGNOSIS — M19072 Primary osteoarthritis, left ankle and foot: Secondary | ICD-10-CM | POA: Diagnosis not present

## 2022-03-06 DIAGNOSIS — M7751 Other enthesopathy of right foot: Secondary | ICD-10-CM

## 2022-03-06 DIAGNOSIS — M2142 Flat foot [pes planus] (acquired), left foot: Secondary | ICD-10-CM

## 2022-03-06 MED ORDER — DEXAMETHASONE SODIUM PHOSPHATE 120 MG/30ML IJ SOLN
4.0000 mg | Freq: Once | INTRAMUSCULAR | Status: AC
  Administered 2022-03-06: 4 mg via INTRA_ARTICULAR

## 2022-03-06 NOTE — Progress Notes (Signed)
  Subjective:  Patient ID: Andrea White, female    DOB: July 19, 1955,   MRN: [id number]  Chief Complaint  Patient presents with   Ankle Pain    bil ankle pain - injections    67 y.o. female presents for follow-up of bilateral ankle and subtalar pain. Patient has dealt with this pain for years and has had injections in the past with Dr. Cannon Kettle. Requesting injections today. Relates they usually give her a bout a year of relief . Denies any other pedal complaints. Denies n/v/f/c.   Past Medical History:  Diagnosis Date   Allergy    Anxiety    Arthritis    Bradycardia, unspecified    Bruises easily    Calf pain    when walking   Fibromyalgia    Frequent headaches    GERD (gastroesophageal reflux disease)    H/O bladder problems    Hypertension    IBS (irritable bowel syndrome)    Localized edema    Mild persistent asthma, uncomplicated    Morbid (severe) obesity due to excess calories (HCC)    Muscle pain    Myocardial infarction (HCC)    Other hypotension    Ovarian cyst    Slow transit constipation    Solitary pulmonary nodule    Swelling    feet and legs   Type 2 diabetes mellitus with diabetic nephropathy (HCC)    Varicose veins of bilateral lower extremities with pain     Objective:  Physical Exam: Vascular: DP/PT pulses 2/4 bilateral. CFT <3 seconds. Normal hair growth on digits. No edema.  Skin. No lacerations or abrasions bilateral feet.  Musculoskeletal: MMT 5/5 bilateral lower extremities in DF, PF, Inversion and Eversion. Deceased ROM in DF of ankle joint.  Pes planus noted bilateral with tenderness noted in the sinus tarsi area. Pain with ROM of the STJ. Some pain with ROM of the ankle joint.  Neurological: Sensation intact to light touch.   Assessment:   1. Osteoarthritis of both feet, unspecified osteoarthritis type   2. Capsulitis of foot, left   3. Capsulitis of toe of right foot   4. Pes planus of both feet      Plan:  Patient was evaluated and  treated and all questions answered. Discussed capsulitis and inflammation of joint and treatment options with patient.  Radiographs reviewed and discussed with patient. Pes planus noted bilateral with degenerative changes noted in ankle and subtalar joint  Injection offered today. Patient in agreement.  Discussed if pain does not improve may consider PT and/or MRI for further surgical planning.  Patient to return as needed.   Procedure: Injection Tendon/Ligament Discussed alternatives, risks, complications and verbal consent was obtained.  Location: Bilateral sinus tarsi. Skin Prep: Alcohol. Injectate: 1cc 0.5% marcaine plain, 1 cc dexamethasone.  Disposition: Patient tolerated procedure well. Injection site dressed with a band-aid.  Post-injection care was discussed and return precautions discussed.     Andrea White, DPM

## 2022-03-06 NOTE — Telephone Encounter (Signed)
Patient will call us back when she needs refills.  She is switching her medications to [facility].

## 2022-03-11 ENCOUNTER — Other Ambulatory Visit: Payer: Self-pay

## 2022-03-11 ENCOUNTER — Telehealth: Payer: Self-pay

## 2022-03-11 DIAGNOSIS — E1159 Type 2 diabetes mellitus with other circulatory complications: Secondary | ICD-10-CM

## 2022-03-11 MED ORDER — VALSARTAN 80 MG PO TABS: 80.0000 mg | ORAL_TABLET | Freq: Every day | ORAL | 1 refills | Status: DC

## 2022-03-11 MED ORDER — PRAVASTATIN SODIUM 20 MG PO TABS: 20.0000 mg | ORAL_TABLET | Freq: Every day | ORAL | 1 refills | Status: DC

## 2022-03-11 NOTE — Progress Notes (Signed)
Care Management & Coordination Services Pharmacy Team  Reason for Encounter: Appointment Reminder  Contacted patient to confirm telephone appointment with [name], PharmD on 03/13/22 at 2:30 pm.  Spoke with patient on 03/11/2022   Do you have any problems getting your medications? Yes If yes what types of problems are you experiencing? Note is below  What is your top health concern you would like to discuss at your upcoming visit? No top health concerns   Have you seen any other providers since your last visit with PCP? No   Chart review:  Recent office visits:  None  Recent consult visits:  03/06/22 (Podiatry) Lorenda Peck DPM. Seen for Ankle Pain. No med changes.   Hospital visits:  None   Star Rating Drugs:  Medication:  Last Fill: [facility] Pravastatin 20 mg- Last filled 11-16-2021 90 DS. Previous 09-07-2021 90 DS Valsartan 80 mg- Last filled 10-03-2021 90 DS. Previous 07-26-2021 90 DS Jardiance 10 mg- Last filled 01-19-19-2023 90 DS. Previous 11-11-2021 90 DS  ([place] contacted [facility] and was told patient's coverage ended on 01-20-2022) Pt has switched to [place] so she had to switch pharmacies to [facility]. Pt is out of refills so I have messaged PCP to send these refills in.   Care Gaps: Annual wellness visit in last year? Yes  If Diabetic: Last eye exam / retinopathy screening:09/17/21 Last diabetic foot exam:02/04/22   Elray Mcgregor, [place] Pharmacist Assistant  [phone]

## 2022-03-12 ENCOUNTER — Other Ambulatory Visit: Payer: Self-pay | Admitting: Family Medicine

## 2022-03-13 ENCOUNTER — Ambulatory Visit: Payer: Medicare HMO

## 2022-03-13 NOTE — Patient Outreach (Signed)
  Care Management   Follow Up Note   03/13/2022 Name: Andrea White MRN: [id number] DOB: Jan 15, 1956   Referred by: [name], MD Reason for referral : Care Coordination   Successful contact was made with patient to discuss care management and care coordination services. Patient wishes to consider and/or discuss with PCP prior to consenting to or declining services.   Follow Up Plan: The patient has been provided with contact information for the care management team and has been advised to call with any health related questions or concerns.   -Patient stated she forgot about the phone call today and was outside burning leaves. Stated, "If it's not going to take long I can answer your questions now but if it is I can't." I tried to explain that I'm here to help you, not to ask questions. Patient is not interested in care at this time   [name], [place].D. - [phone]

## 2022-04-22 ENCOUNTER — Ambulatory Visit (INDEPENDENT_AMBULATORY_CARE_PROVIDER_SITE_OTHER): Payer: Medicare HMO | Admitting: Family Medicine

## 2022-04-22 ENCOUNTER — Encounter: Payer: Self-pay | Admitting: Family Medicine

## 2022-04-22 VITALS — BP 120/64 | HR 84 | Temp 96.2°F | Resp 18 | Ht 62.0 in | Wt 199.4 lb

## 2022-04-22 DIAGNOSIS — J208 Acute bronchitis due to other specified organisms: Secondary | ICD-10-CM | POA: Diagnosis not present

## 2022-04-22 DIAGNOSIS — J4541 Moderate persistent asthma with (acute) exacerbation: Secondary | ICD-10-CM

## 2022-04-22 DIAGNOSIS — J4540 Moderate persistent asthma, uncomplicated: Secondary | ICD-10-CM | POA: Insufficient documentation

## 2022-04-22 LAB — POC COVID19 BINAXNOW: SARS Coronavirus 2 Ag: NEGATIVE

## 2022-04-22 MED ORDER — BENZONATATE 200 MG PO CAPS
200.0000 mg | ORAL_CAPSULE | Freq: Three times a day (TID) | ORAL | 0 refills | Status: DC | PRN
Start: 2022-04-22 — End: 2022-06-05

## 2022-04-22 MED ORDER — TRIAMCINOLONE ACETONIDE 40 MG/ML IJ SUSP
80.0000 mg | Freq: Once | INTRAMUSCULAR | Status: AC
Start: 2022-04-22 — End: 2022-04-22
  Administered 2022-04-22: 80 mg via INTRAMUSCULAR

## 2022-04-22 MED ORDER — TRELEGY ELLIPTA 200-62.5-25 MCG/ACT IN AEPB
1.0000 | INHALATION_SPRAY | Freq: Every day | RESPIRATORY_TRACT | 0 refills | Status: DC
Start: 2022-04-22 — End: 2022-06-05

## 2022-04-22 MED ORDER — AZITHROMYCIN 250 MG PO TABS
ORAL_TABLET | ORAL | 0 refills | Status: DC
Start: 2022-04-22 — End: 2022-04-30

## 2022-04-22 MED ORDER — PREDNISONE 50 MG PO TABS
50.0000 mg | ORAL_TABLET | Freq: Every day | ORAL | 0 refills | Status: DC
Start: 2022-04-22 — End: 2022-04-30

## 2022-04-22 MED ORDER — NYSTATIN 100000 UNIT/ML MT SUSP: 5.0000 mL | Freq: Four times a day (QID) | OROMUCOSAL | 0 refills | Status: DC | PRN

## 2022-04-22 NOTE — Progress Notes (Unsigned)
Acute Office Visit  Subjective:    Patient ID: Andrea White, female    DOB: May 04, 1955, 67 y.o.   MRN: [id number]  Chief Complaint  Patient presents with   Cough    HPI: Patient is in today for congested cough and wheezing which has been going on for 2 weeks.  She initially started with sore throat 2 weeks ago but that has resolved and now she continues to cough.  She reports no fever or chills.     Past Medical History:  Diagnosis Date   Allergy    Anxiety    Arthritis    Bradycardia, unspecified    Bruises easily    Calf pain    when walking   Fibromyalgia    Frequent headaches    GERD (gastroesophageal reflux disease)    H/O bladder problems    Hypertension    IBS (irritable bowel syndrome)    Localized edema    Mild persistent asthma, uncomplicated    Morbid (severe) obesity due to excess calories    Muscle pain    Myocardial infarction    Other hypotension    Ovarian cyst    Slow transit constipation    Solitary pulmonary nodule    Swelling    feet and legs   Type 2 diabetes mellitus with diabetic nephropathy    Varicose veins of bilateral lower extremities with pain     Past Surgical History:  Procedure Laterality Date   BUNIONECTOMY     left foot 5th toe   CARDIAC CATHETERIZATION     carpel tunnel surgery     CESAREAN SECTION     CHOLECYSTECTOMY     GALLBLADDER SURGERY     TONSILLECTOMY     TUBAL LIGATION     VAGINAL HYSTERECTOMY  2003   LAVH/SR    Family History  Problem Relation Age of Onset   Heart disease Father    Hypertension Father    Stroke Father    Heart attack Father    Cerebrovascular Accident Father    Diabetes Sister    Kidney disease Sister    Migraines Daughter    Breast cancer Maternal Aunt    Cancer Maternal Aunt 74       breast    Social History   Socioeconomic History   Marital status: Married    Spouse name: Not on file   Number of children: 1   Years of education: Not on file   Highest education level:  Not on file  Occupational History   Occupation: Disabled due to foot problems since 1993  Tobacco Use   Smoking status: Never   Smokeless tobacco: Never  Vaping Use   Vaping Use: Never used  Substance and Sexual Activity   Alcohol use: No   Drug use: No   Sexual activity: Yes    Birth control/protection: Surgical    Comment: hyst  Other Topics Concern   Not on file  Social History Narrative   Not on file   Social Determinants of Health   Financial Resource Strain: Not on file  Food Insecurity: No Food Insecurity (06/06/2021)   Hunger Vital Sign    Worried About Running Out of Food in the Last Year: Never true    Ran Out of Food in the Last Year: Never true  Transportation Needs: No Transportation Needs (06/06/2021)   PRAPARE - Hydrologist (Medical): No    Lack of Transportation (Non-Medical): No  Physical Activity: Insufficiently Active (05/30/2020)   Exercise Vital Sign    Days of Exercise per Week: 4 days    Minutes of Exercise per Session: 30 min  Stress: No Stress Concern Present (06/06/2021)   [place]    Feeling of Stress : Only a little  Social Connections: Not on file  Intimate Partner Violence: Not At Risk (06/06/2021)   Humiliation, Afraid, Rape, and Kick questionnaire    Fear of Current or Ex-Partner: No    Emotionally Abused: No    Physically Abused: No    Sexually Abused: No    Outpatient Medications Prior to Visit  Medication Sig Dispense Refill   albuterol (VENTOLIN HFA) 108 (90 Base) MCG/ACT inhaler INHALE 2 PUFFS INTO THE LUNGS EVERY 6 (SIX) HOURS AS NEEDED FOR WHEEZING OR SHORTNESS OF BREATH. 1 each 3   ALPRAZolam (XANAX) 0.5 MG tablet TAKE 1/2 TO 1 TABLET EVERY 8 HOURS AS NEEDED 30 tablet 2   APPLE CIDER VINEGAR PO Take by mouth daily.     Biotin 10000 MCG TABS Take 1 tablet by mouth daily.     diclofenac Sodium (VOLTAREN) 1 % GEL Apply 4 g topically 4  (four) times daily as needed (knees, foot, or hand pain). 1000 g 3   DULoxetine (CYMBALTA) 60 MG capsule Take 1 capsule (60 mg total) by mouth daily. 90 capsule 1   empagliflozin (JARDIANCE) 10 MG TABS tablet Take 1 tablet (10 mg total) by mouth daily before breakfast. 90 tablet 0   hydrochlorothiazide (MICROZIDE) 12.5 MG capsule TAKE 1 CAPSULE EVERY DAY 90 capsule 1   loratadine (CLARITIN) 10 MG tablet TAKE 1 TABLET EVERY DAY 90 tablet 3   meclizine (ANTIVERT) 25 MG tablet TAKE 1 TABLET TWICE DAILY 180 tablet 1   Multiple Vitamin (MULTIVITAMIN) tablet Take 1 tablet by mouth daily.     omega-3 acid ethyl esters (LOVAZA) 1 g capsule TAKE 2 CAPSULES TWICE DAILY 360 capsule 3   omeprazole (PRILOSEC) 40 MG capsule TAKE 1 CAPSULE TWICE DAILY 180 capsule 1   oxybutynin (DITROPAN) 5 MG tablet Take 1 tablet (5 mg total) by mouth 3 (three) times daily. 270 tablet 0   pravastatin (PRAVACHOL) 20 MG tablet TAKE 1 TABLET DAILY. 90 tablet 1   tiZANidine (ZANAFLEX) 2 MG tablet Take 1 tablet (2 mg total) by mouth every 8 (eight) hours as needed for muscle spasms. (Patient taking differently: Take 2 mg by mouth at bedtime as needed for muscle spasms.) 50 tablet 0   traMADol (ULTRAM) 50 MG tablet Take 1 tablet (50 mg total) by mouth 3 (three) times daily as needed. 90 tablet 1   valACYclovir (VALTREX) 1000 MG tablet Take 2 tablets (2,000 mg total) by mouth 2 (two) times daily as needed (x 1 day for each break out). 20 tablet 0   valsartan (DIOVAN) 80 MG tablet Take 1 tablet (80 mg total) by mouth daily. (Patient taking differently: Take 40 mg by mouth daily.) 90 tablet 1   Blood Glucose Monitoring Suppl (TRUE METRIX METER) w/Device KIT USE AS DIRECTED 1 kit 0   TRUE METRIX BLOOD GLUCOSE TEST test strip USE AS DIRECTED TO CHECK FASTING BLOOD SUGAR. 300 strip 2   TRUEplus Lancets 33G MISC E11.69 use new lancet each time when checking FBS 100 each 2   predniSONE (DELTASONE) 50 MG tablet Take 1 tablet (50 mg total) by  mouth daily with breakfast. 5 tablet 0   No facility-administered medications  prior to visit.    Allergies  Allergen Reactions   Morphine And Related Other (See Comments)    Woozy, "out of body experience"   Celebrex [Celecoxib] Other (See Comments)    Patient does not remember intolerance   Fenofibrate     Vertigo    Gabapentin Other (See Comments)    Confusion   Lisinopril Cough   Guaifenesin & Derivatives Other (See Comments)    Patient cannot remember specific issue, just made her feel "weird"   Lipitor [Atorvastatin] Rash   Sulfa Antibiotics Rash   Valium [Diazepam] Other (See Comments)    Lightheaded, dizziness     Review of Systems  Constitutional:  Negative for chills and fever.  HENT:  Positive for congestion.   Respiratory:  Positive for cough, shortness of breath and wheezing.   Cardiovascular:  Negative for chest pain.  Neurological:  Negative for headaches.  Psychiatric/Behavioral:  Negative for dysphoric mood. The patient is not nervous/anxious.        Objective:        04/22/2022    4:24 PM 02/04/2022    8:33 AM 02/04/2022    8:15 AM  Vitals with BMI  Height 5\' 2"   5\' 2"   Weight 199 lbs 6 oz  195 lbs  BMI A999333  A999333  Systolic [id number]  Diastolic 64 86 94  Pulse 84  70    No data found.   Physical Exam Vitals reviewed.  Constitutional:      Appearance: Normal appearance.  HENT:     Right Ear: Tympanic membrane, ear canal and external ear normal.     Left Ear: Tympanic membrane, ear canal and external ear normal.     Nose: Congestion and rhinorrhea present.     Mouth/Throat:     Pharynx: Oropharynx is clear. No oropharyngeal exudate or posterior oropharyngeal erythema.  Cardiovascular:     Rate and Rhythm: Normal rate and regular rhythm.     Heart sounds: Normal heart sounds. No murmur heard. Pulmonary:     Effort: Respiratory distress (coughs with inspiration.) present.     Breath sounds: Normal breath sounds. No wheezing.   Lymphadenopathy:     Cervical: No cervical adenopathy.  Neurological:     Mental Status: She is alert and oriented to person, place, and time.  Psychiatric:        Mood and Affect: Mood normal.        Behavior: Behavior normal.     Health Maintenance Due  Topic Date Due   Zoster Vaccines- Shingrix (1 of 2) Never done   COLONOSCOPY (Pts 45-80yrs Insurance coverage will need to be confirmed)  05/14/2016   COVID-19 Vaccine (3 - Moderna risk series) 09/20/2019   Medicare Annual Wellness (AWV)  06/07/2022    There are no preventive care reminders to display for this patient.   Lab Results  Component Value Date   TSH 2.220 10/04/2021   Lab Results  Component Value Date   WBC 11.8 (H) 02/04/2022   HGB 14.6 02/04/2022   HCT 44.4 02/04/2022   MCV 84 02/04/2022   PLT 477 (H) 02/04/2022   Lab Results  Component Value Date   NA 144 02/04/2022   K 4.1 02/04/2022   CO2 25 02/04/2022   GLUCOSE 92 02/04/2022   BUN 21 02/04/2022   CREATININE 1.25 (H) 02/04/2022   BILITOT 0.7 02/04/2022   ALKPHOS 112 02/04/2022   AST 24 02/04/2022   ALT 22 02/04/2022   PROT 7.0  02/04/2022   ALBUMIN 4.5 02/04/2022   CALCIUM 9.6 02/04/2022   EGFR 48 (L) 02/04/2022   Lab Results  Component Value Date   CHOL 181 02/04/2022   Lab Results  Component Value Date   HDL 65 02/04/2022   Lab Results  Component Value Date   LDLCALC 88 02/04/2022   Lab Results  Component Value Date   TRIG 167 (H) 02/04/2022   Lab Results  Component Value Date   CHOLHDL 2.8 02/04/2022   Lab Results  Component Value Date   HGBA1C 6.7 (H) 02/04/2022       Assessment & Plan:  Acute bronchitis due to other specified organisms -     POC COVID-19 BinaxNow -     Azithromycin; 2 DAILY FOR FIRST DAY, THEN DECREASE TO ONE DAILY FOR 4 MORE DAYS.  Dispense: 6 tablet; Refill: 0 -     Benzonatate; Take 1 capsule (200 mg total) by mouth 3 (three) times daily as needed for cough.  Dispense: 30 capsule; Refill:  0  Moderate persistent asthma with exacerbation -     predniSONE; Take 1 tablet (50 mg total) by mouth daily with breakfast.  Dispense: 5 tablet; Refill: 0 -     Triamcinolone Acetonide -     Trelegy Ellipta; Inhale 1 Inhalation into the lungs daily.  Dispense: 14 each; Refill: 0  Other orders -     Nystatin; Take 5 mLs (500,000 Units total) by mouth 4 (four) times daily as needed (thrush).  Dispense: 120 mL; Refill: 0     Meds ordered this encounter  Medications   predniSONE (DELTASONE) 50 MG tablet    Sig: Take 1 tablet (50 mg total) by mouth daily with breakfast.    Dispense:  5 tablet    Refill:  0   azithromycin (ZITHROMAX) 250 MG tablet    Sig: 2 DAILY FOR FIRST DAY, THEN DECREASE TO ONE DAILY FOR 4 MORE DAYS.    Dispense:  6 tablet    Refill:  0   triamcinolone acetonide (KENALOG-40) injection 80 mg   benzonatate (TESSALON) 200 MG capsule    Sig: Take 1 capsule (200 mg total) by mouth 3 (three) times daily as needed for cough.    Dispense:  30 capsule    Refill:  0   Fluticasone-Umeclidin-Vilant (TRELEGY ELLIPTA) 200-62.5-25 MCG/ACT AEPB    Sig: Inhale 1 Inhalation into the lungs daily.    Dispense:  14 each    Refill:  0   nystatin (MYCOSTATIN) 100000 UNIT/ML suspension    Sig: Take 5 mLs (500,000 Units total) by mouth 4 (four) times daily as needed (thrush).    Dispense:  120 mL    Refill:  0    Orders Placed This Encounter  Procedures   POC COVID-19     Follow-up: No follow-ups on file.  An After Visit Summary was printed and given to the patient.  [name], MD [facility] [phone]

## 2022-04-23 ENCOUNTER — Ambulatory Visit: Payer: Medicare HMO | Admitting: Family Medicine

## 2022-04-24 DIAGNOSIS — M5136 Other intervertebral disc degeneration, lumbar region: Secondary | ICD-10-CM | POA: Diagnosis not present

## 2022-04-24 DIAGNOSIS — G894 Chronic pain syndrome: Secondary | ICD-10-CM | POA: Diagnosis not present

## 2022-04-24 DIAGNOSIS — M791 Myalgia, unspecified site: Secondary | ICD-10-CM | POA: Diagnosis not present

## 2022-04-25 ENCOUNTER — Other Ambulatory Visit: Payer: Self-pay

## 2022-04-25 MED ORDER — OMEGA-3-ACID ETHYL ESTERS 1 G PO CAPS: 2.0000 | ORAL_CAPSULE | Freq: Two times a day (BID) | ORAL | 3 refills | Status: DC

## 2022-04-25 MED ORDER — EMPAGLIFLOZIN 10 MG PO TABS: 10.0000 mg | ORAL_TABLET | Freq: Every day | ORAL | 0 refills | Status: DC

## 2022-04-29 ENCOUNTER — Other Ambulatory Visit: Payer: Self-pay | Admitting: Family Medicine

## 2022-04-29 DIAGNOSIS — J4541 Moderate persistent asthma with (acute) exacerbation: Secondary | ICD-10-CM

## 2022-04-29 DIAGNOSIS — J208 Acute bronchitis due to other specified organisms: Secondary | ICD-10-CM

## 2022-04-30 ENCOUNTER — Other Ambulatory Visit: Payer: Self-pay | Admitting: Family Medicine

## 2022-04-30 ENCOUNTER — Telehealth: Payer: Self-pay

## 2022-04-30 DIAGNOSIS — J208 Acute bronchitis due to other specified organisms: Secondary | ICD-10-CM

## 2022-04-30 DIAGNOSIS — J4541 Moderate persistent asthma with (acute) exacerbation: Secondary | ICD-10-CM

## 2022-04-30 MED ORDER — AZITHROMYCIN 250 MG PO TABS: ORAL_TABLET | ORAL | 0 refills | Status: DC

## 2022-04-30 MED ORDER — PREDNISONE 50 MG PO TABS
50.0000 mg | ORAL_TABLET | Freq: Every day | ORAL | 0 refills | Status: DC
Start: 2022-04-30 — End: 2022-06-05

## 2022-04-30 NOTE — Telephone Encounter (Signed)
Patient called and stated that she put in a request for refill on her prednisone and her antibiotic that she was given on the 1st. She states that she still is coughing and still hasn't gotten rid of the wheezing and congestion. Please advise.

## 2022-04-30 NOTE — Telephone Encounter (Signed)
Left detailed message that medication was sent.  She was advised to call us back if symptoms worsen or do not improve.

## 2022-05-01 ENCOUNTER — Other Ambulatory Visit: Payer: Self-pay

## 2022-05-01 MED ORDER — OMEGA-3-ACID ETHYL ESTERS 1 G PO CAPS: 2.0000 | ORAL_CAPSULE | Freq: Two times a day (BID) | ORAL | 3 refills | Status: DC

## 2022-05-01 NOTE — Telephone Encounter (Signed)
Patient called and ask to send Lovaza to [facility], it is on back order at [place].

## 2022-05-06 ENCOUNTER — Ambulatory Visit
Admission: RE | Admit: 2022-05-06 | Discharge: 2022-05-06 | Disposition: A | Discharge status: Home / Self Care | Payer: Medicare HMO | Source: Ambulatory Visit | Attending: Obstetrics and Gynecology | Admitting: Obstetrics and Gynecology

## 2022-05-06 DIAGNOSIS — Z780 Asymptomatic menopausal state: Secondary | ICD-10-CM | POA: Diagnosis not present

## 2022-05-06 DIAGNOSIS — M85851 Other specified disorders of bone density and structure, right thigh: Secondary | ICD-10-CM | POA: Diagnosis not present

## 2022-05-06 DIAGNOSIS — Z13820 Encounter for screening for osteoporosis: Secondary | ICD-10-CM

## 2022-05-10 ENCOUNTER — Other Ambulatory Visit: Payer: Self-pay

## 2022-05-10 MED ORDER — EMPAGLIFLOZIN 10 MG PO TABS: 10.0000 mg | ORAL_TABLET | Freq: Every day | ORAL | 0 refills | Status: DC

## 2022-05-16 ENCOUNTER — Other Ambulatory Visit: Payer: Self-pay | Admitting: Family Medicine

## 2022-06-04 ENCOUNTER — Ambulatory Visit (INDEPENDENT_AMBULATORY_CARE_PROVIDER_SITE_OTHER): Payer: Medicare HMO | Admitting: Podiatrist

## 2022-06-04 DIAGNOSIS — E1142 Type 2 diabetes mellitus with diabetic polyneuropathy: Secondary | ICD-10-CM

## 2022-06-04 DIAGNOSIS — M2042 Other hammer toe(s) (acquired), left foot: Secondary | ICD-10-CM

## 2022-06-04 DIAGNOSIS — M2142 Flat foot [pes planus] (acquired), left foot: Secondary | ICD-10-CM

## 2022-06-04 DIAGNOSIS — M2141 Flat foot [pes planus] (acquired), right foot: Secondary | ICD-10-CM

## 2022-06-04 DIAGNOSIS — M2041 Other hammer toe(s) (acquired), right foot: Secondary | ICD-10-CM

## 2022-06-04 NOTE — Progress Notes (Signed)
Patient presents to the office today for diabetic shoe and insole measuring.  Documentation of medical necessity will be sent to patient's treating diabetic doctor to verify and sign.   Patient's diabetic provider: DR COX  Shoes and insoles will be ordered at that time and patient will be notified for an appointment for fitting when they arrive.   Patient shoe selection-   1st   Shoe choice:   [id number]  Shoe size ordered: 9.5xw

## 2022-06-05 ENCOUNTER — Encounter: Payer: Self-pay | Admitting: Family Medicine

## 2022-06-05 ENCOUNTER — Ambulatory Visit (INDEPENDENT_AMBULATORY_CARE_PROVIDER_SITE_OTHER): Payer: Medicare HMO | Admitting: Family Medicine

## 2022-06-05 VITALS — BP 110/80 | HR 65 | Temp 97.4°F | Resp 12 | Ht 62.0 in | Wt 198.0 lb

## 2022-06-05 DIAGNOSIS — L570 Actinic keratosis: Secondary | ICD-10-CM

## 2022-06-05 DIAGNOSIS — N1832 Chronic kidney disease, stage 3b: Secondary | ICD-10-CM

## 2022-06-05 DIAGNOSIS — I130 Hypertensive heart and chronic kidney disease with heart failure and stage 1 through stage 4 chronic kidney disease, or unspecified chronic kidney disease: Secondary | ICD-10-CM

## 2022-06-05 DIAGNOSIS — K219 Gastro-esophageal reflux disease without esophagitis: Secondary | ICD-10-CM | POA: Diagnosis not present

## 2022-06-05 DIAGNOSIS — J4541 Moderate persistent asthma with (acute) exacerbation: Secondary | ICD-10-CM | POA: Diagnosis not present

## 2022-06-05 DIAGNOSIS — E1142 Type 2 diabetes mellitus with diabetic polyneuropathy: Secondary | ICD-10-CM | POA: Diagnosis not present

## 2022-06-05 DIAGNOSIS — Z6836 Body mass index (BMI) 36.0-36.9, adult: Secondary | ICD-10-CM

## 2022-06-05 DIAGNOSIS — I5041 Acute combined systolic (congestive) and diastolic (congestive) heart failure: Secondary | ICD-10-CM

## 2022-06-05 DIAGNOSIS — M797 Fibromyalgia: Secondary | ICD-10-CM | POA: Diagnosis not present

## 2022-06-05 DIAGNOSIS — E782 Mixed hyperlipidemia: Secondary | ICD-10-CM | POA: Diagnosis not present

## 2022-06-05 MED ORDER — DULOXETINE HCL 60 MG PO CPEP
60.0000 mg | ORAL_CAPSULE | Freq: Every day | ORAL | 1 refills | Status: DC
Start: 2022-06-05 — End: 2022-12-20

## 2022-06-05 MED ORDER — EMPAGLIFLOZIN 10 MG PO TABS
10.0000 mg | ORAL_TABLET | Freq: Every day | ORAL | 1 refills | Status: DC
Start: 2022-06-05 — End: 2022-08-26

## 2022-06-05 MED ORDER — HYDROCHLOROTHIAZIDE 12.5 MG PO CAPS: 12.5000 mg | ORAL_CAPSULE | Freq: Every day | ORAL | 1 refills | Status: DC

## 2022-06-05 MED ORDER — OMEPRAZOLE 40 MG PO CPDR
40.0000 mg | DELAYED_RELEASE_CAPSULE | Freq: Two times a day (BID) | ORAL | 1 refills | Status: DC
Start: 2022-06-05 — End: 2022-12-20

## 2022-06-05 MED ORDER — TIZANIDINE HCL 2 MG PO TABS
2.0000 mg | ORAL_TABLET | Freq: Every evening | ORAL | 0 refills | Status: DC | PRN
Start: 2022-06-05 — End: 2023-05-26

## 2022-06-05 MED ORDER — TRELEGY ELLIPTA 200-62.5-25 MCG/ACT IN AEPB
1.0000 | INHALATION_SPRAY | Freq: Every day | RESPIRATORY_TRACT | 3 refills | Status: DC
Start: 2022-06-05 — End: 2023-01-17

## 2022-06-05 MED ORDER — NYSTATIN 100000 UNIT/ML MT SUSP: 5.0000 mL | Freq: Four times a day (QID) | OROMUCOSAL | 0 refills | Status: DC | PRN

## 2022-06-05 MED ORDER — PRAVASTATIN SODIUM 20 MG PO TABS: 20.0000 mg | ORAL_TABLET | Freq: Every day | ORAL | 1 refills | Status: DC

## 2022-06-05 NOTE — Assessment & Plan Note (Signed)
Control: Controlled Recommend check sugars fasting daily. Recommend check feet daily. Recommend annual eye exams. Medicines: Jardiance 10 mg once daily.  Continue to work on eating a healthy diet and exercise.  Labs drawn today.    

## 2022-06-05 NOTE — Assessment & Plan Note (Signed)
Recommend continue to work on eating healthy diet and exercise. Comorbidities: diabetes and hyperlipidemia. 

## 2022-06-05 NOTE — Assessment & Plan Note (Signed)
Cryotherapy 2 six second cycles.

## 2022-06-05 NOTE — Assessment & Plan Note (Signed)
Well controlled.  No changes to medicines. hydrochlorothiazide 12.5 mg once daily and Valsartan 80 mg daily. Continue to work on eating a healthy diet and exercise.  Labs drawn today.   

## 2022-06-05 NOTE — Assessment & Plan Note (Signed)
The current medical regimen is effective;  continue present plan and medications. omeprazole 40 mg one oral twice daily.  

## 2022-06-05 NOTE — Assessment & Plan Note (Signed)
Continue duloxetine 60 mg before bed.

## 2022-06-05 NOTE — Assessment & Plan Note (Addendum)
The current medical regimen is effective;  continue present plan and medications. Restart trelegy 200 mg one inhalation daily.

## 2022-06-05 NOTE — Assessment & Plan Note (Signed)
Not at goal.  Continue pravastatin 20 mg daily. Continue to work on eating a healthy diet and exercise.  Labs drawn today.

## 2022-06-05 NOTE — Progress Notes (Signed)
Subjective:  Patient ID: Andrea White, female    DOB: [birth year]  Age: 67 y.o. MRN: [id number]  Chief Complaint  Patient presents with   Medical Management of Chronic Issues    HPI Diabetes:  Complications: neuropathy. Glucose checking: some days Glucose logs: last time was one week ago 121 mg/dl. She does not check frequently. Hypoglycemia: none Most recent A1C: 6.7 Current medications: Jardiance 10 mg once daily.  Foot checks: daily.  Hyperlipidemia: Current medications: Pravastatin 20 mg daily  Hypertension: Current medications: hydrochlorothiazide 12.5 mg once daily and Valsartan 80 mg daily.  GERD: on omeprazole 40 mg one oral twice daily.   Urge incontinence: oxybutynin 5 mg three times a day.   FM: on duloxetine 60 mg before bed.   Chronic Back Pain: Takes tramadol three times a day. Using diclofenac gel.   Allergic rhinitis: claritin.  Asthma: on albuterol 2 puffs as needed  Diet: eats what she wants, but this is fruits, vegetables, meat in the evening. Avoids fried foods and refined sugars.  Exercise:  Gardening.         06/05/2022   10:26 AM 06/06/2021    3:26 PM 04/03/2021    8:34 AM 10/04/2020    7:56 AM 09/05/2020    1:38 PM  Depression screen PHQ 2/9  Decreased Interest 0 0 0 0 0  Down, Depressed, Hopeless 0 0 0 0 0  PHQ - 2 Score 0 0 0 0 0  Altered sleeping 0    0  Tired, decreased energy 0    0  Change in appetite 0    0  Feeling bad or failure about yourself  0    0  Trouble concentrating 0    0  Moving slowly or fidgety/restless 0    0  Suicidal thoughts 0    0  PHQ-9 Score 0    0  Difficult doing work/chores Not difficult at all    Not difficult at all        06/05/2022   10:26 AM  Fall Risk   Falls in the past year? 0  Number falls in past yr: 0  Injury with Fall? 0  Risk for fall due to : No Fall Risks  Follow up Education provided    Patient Care Team: Blane Ohara, MD as PCP - General (Family Medicine) Linda Hedges, MD  as Referring Physician (Orthopedic Surgery) [name], DPM as Consulting Physician (Podiatry) Silverio Lay, MD as Consulting Physician (Obstetrics and Gynecology) Albin Felling, OD (Optometry) Patricia Nettle, MD (Orthopedic Surgery)   Review of Systems  Constitutional:  Negative for chills, fatigue and fever.  HENT:  Negative for congestion, ear pain and sore throat.   Respiratory:  Negative for cough and shortness of breath.   Cardiovascular:  Negative for chest pain and palpitations.  Gastrointestinal:  Negative for abdominal pain, constipation, diarrhea, nausea and vomiting.  Endocrine: Negative for polydipsia, polyphagia and polyuria.  Genitourinary:  Negative for difficulty urinating and dysuria.  Musculoskeletal:  Negative for arthralgias, back pain and myalgias.  Skin:  Negative for rash.  Neurological:  Negative for headaches.  Psychiatric/Behavioral:  Negative for dysphoric mood. The patient is not nervous/anxious.     Current Outpatient Medications on File Prior to Visit  Medication Sig Dispense Refill   albuterol (VENTOLIN HFA) 108 (90 Base) MCG/ACT inhaler INHALE 2 PUFFS INTO THE LUNGS EVERY 6 (SIX) HOURS AS NEEDED FOR WHEEZING OR SHORTNESS OF BREATH. 1 each 3   ALPRAZolam (  XANAX) 0.5 MG tablet TAKE 1/2 TO 1 TABLET EVERY 8 HOURS AS NEEDED 30 tablet 2   APPLE CIDER VINEGAR PO Take by mouth daily.     Biotin 16109 MCG TABS Take 1 tablet by mouth daily.     diclofenac Sodium (VOLTAREN) 1 % GEL Apply 4 g topically 4 (four) times daily as needed (knees, foot, or hand pain). 1000 g 3   loratadine (CLARITIN) 10 MG tablet TAKE 1 TABLET EVERY DAY 90 tablet 3   meclizine (ANTIVERT) 25 MG tablet TAKE 1 TABLET TWICE DAILY 180 tablet 1   Multiple Vitamin (MULTIVITAMIN) tablet Take 1 tablet by mouth daily.     oxybutynin (DITROPAN) 5 MG tablet Take 1 tablet (5 mg total) by mouth 3 (three) times daily. 270 tablet 0   traMADol (ULTRAM) 50 MG tablet Take 1 tablet (50 mg total) by  mouth 3 (three) times daily as needed. 90 tablet 1   TRUE METRIX BLOOD GLUCOSE TEST test strip USE AS DIRECTED TO CHECK FASTING BLOOD SUGAR. 300 strip 2   TRUEplus Lancets 33G MISC E11.69 use new lancet each time when checking FBS 100 each 2   valACYclovir (VALTREX) 1000 MG tablet Take 2 tablets (2,000 mg total) by mouth 2 (two) times daily as needed (x 1 day for each break out). 20 tablet 0   valsartan (DIOVAN) 80 MG tablet Take 1 tablet (80 mg total) by mouth daily. (Patient taking differently: Take 40 mg by mouth daily.) 90 tablet 1   No current facility-administered medications on file prior to visit.   Past Medical History:  Diagnosis Date   Allergy    Anxiety    Arthritis    Bradycardia, unspecified    Bruises easily    Calf pain    when walking   Fibromyalgia    Frequent headaches    GERD (gastroesophageal reflux disease)    H/O bladder problems    Hypertension    IBS (irritable bowel syndrome)    Localized edema    Mild persistent asthma, uncomplicated    Morbid (severe) obesity due to excess calories (HCC)    Muscle pain    Myocardial infarction (HCC)    Other hypotension    Ovarian cyst    Slow transit constipation    Solitary pulmonary nodule    Swelling    feet and legs   Type 2 diabetes mellitus with diabetic nephropathy (HCC)    Varicose veins of bilateral lower extremities with pain    Past Surgical History:  Procedure Laterality Date   BUNIONECTOMY     left foot 5th toe   CARDIAC CATHETERIZATION     carpel tunnel surgery     CESAREAN SECTION     CHOLECYSTECTOMY     GALLBLADDER SURGERY     TONSILLECTOMY     TUBAL LIGATION     VAGINAL HYSTERECTOMY  2003   LAVH/SR    Family History  Problem Relation Age of Onset   Heart disease Father    Hypertension Father    Stroke Father    Heart attack Father    Cerebrovascular Accident Father    Diabetes Sister    Kidney disease Sister    Migraines Daughter    Breast cancer Maternal Aunt    Cancer  Maternal Aunt 52       breast   Social History   Socioeconomic History   Marital status: Married    Spouse name: Not on file   Number of children: 1  Years of education: Not on file   Highest education level: Not on file  Occupational History   Occupation: Disabled due to foot problems since 1993  Tobacco Use   Smoking status: Never   Smokeless tobacco: Never  Vaping Use   Vaping Use: Never used  Substance and Sexual Activity   Alcohol use: No   Drug use: No   Sexual activity: Yes    Birth control/protection: Surgical    Comment: hyst  Other Topics Concern   Not on file  Social History Narrative   Not on file   Social Determinants of Health   Financial Resource Strain: Not on file  Food Insecurity: No Food Insecurity (06/06/2021)   Hunger Vital Sign    Worried About Running Out of Food in the Last Year: Never true    Ran Out of Food in the Last Year: Never true  Transportation Needs: No Transportation Needs (06/06/2021)   PRAPARE - Administrator, Civil Service (Medical): No    Lack of Transportation (Non-Medical): No  Physical Activity: Insufficiently Active (05/30/2020)   Exercise Vital Sign    Days of Exercise per Week: 4 days    Minutes of Exercise per Session: 30 min  Stress: No Stress Concern Present (06/06/2021)   [employer] of Occupational Health - Occupational Stress Questionnaire    Feeling of Stress : Only a little  Social Connections: Not on file    Objective:  BP 110/80   Pulse 65   Temp (!) 97.4 F (36.3 C)   Resp 12   Ht 5\' 2"  (1.575 m)   Wt 198 lb (89.8 kg)   SpO2 97%   BMI 36.21 kg/m      06/05/2022   10:12 AM 04/22/2022    4:24 PM 02/04/2022    8:33 AM  BP/Weight  Systolic BP 110 120 118  Diastolic BP 80 64 86  Wt. (Lbs) 198 199.4   BMI 36.21 kg/m2 36.47 kg/m2     Physical Exam Vitals reviewed.  Constitutional:      Appearance: Normal appearance. She is normal weight.  Neck:     Vascular: No carotid bruit.   Cardiovascular:     Rate and Rhythm: Normal rate and regular rhythm.     Pulses: Normal pulses.     Heart sounds: Normal heart sounds.  Pulmonary:     Effort: Pulmonary effort is normal. No respiratory distress.     Breath sounds: Normal breath sounds.  Abdominal:     General: Abdomen is flat. Bowel sounds are normal.     Palpations: Abdomen is soft.     Tenderness: There is no abdominal tenderness.  Skin:    Findings: Lesion (left posterior upper arm AK.) present.  Neurological:     Mental Status: She is alert and oriented to person, place, and time.  Psychiatric:        Mood and Affect: Mood normal.        Behavior: Behavior normal.     Diabetic Foot Exam - Simple   Simple Foot Form Diabetic Foot exam was performed with the following findings: Yes 06/05/2022 10:48 AM  Visual Inspection See comments: Yes Sensation Testing Intact to touch and monofilament testing bilaterally: Yes Pulse Check Comments Hammer toes and pes planus, cross toes on first and second digit of the toes.      Lab Results  Component Value Date   WBC 11.8 (H) 02/04/2022   HGB 14.6 02/04/2022   HCT 44.4 02/04/2022  PLT 477 (H) 02/04/2022   GLUCOSE 92 02/04/2022   CHOL 181 02/04/2022   TRIG 167 (H) 02/04/2022   HDL 65 02/04/2022   LDLCALC 88 02/04/2022   ALT 22 02/04/2022   AST 24 02/04/2022   NA 144 02/04/2022   K 4.1 02/04/2022   CL 100 02/04/2022   CREATININE 1.25 (H) 02/04/2022   BUN 21 02/04/2022   CO2 25 02/04/2022   TSH 2.220 10/04/2021   HGBA1C 6.7 (H) 02/04/2022   MICROALBUR 80 09/05/2020      Assessment & Plan:    Hypertensive heart and kidney disease with acute combined systolic and diastolic congestive heart failure and stage 3b chronic kidney disease (HCC) Assessment & Plan: Well controlled.  No changes to medicines. hydrochlorothiazide 12.5 mg once daily and Valsartan 80 mg daily. Continue to work on eating a healthy diet and exercise.  Labs drawn today.     Orders: -     CBC with Differential/Platelet -     Comprehensive metabolic panel -     hydroCHLOROthiazide; Take 1 capsule (12.5 mg total) by mouth daily.  Dispense: 90 capsule; Refill: 1  Gastroesophageal reflux disease without esophagitis Assessment & Plan: The current medical regimen is effective;  continue present plan and medications. omeprazole 40 mg one oral twice daily.   Orders: -     Omeprazole; Take 1 capsule (40 mg total) by mouth 2 (two) times daily.  Dispense: 180 capsule; Refill: 1  Diabetic polyneuropathy associated with type 2 diabetes mellitus (HCC) Assessment & Plan: Control: Controlled Recommend check sugars fasting daily. Recommend check feet daily. Recommend annual eye exams. Medicines: Jardiance 10 mg once daily.  Continue to work on eating a healthy diet and exercise.  Labs drawn today.     Orders: -     Hemoglobin A1c -     Empagliflozin; Take 1 tablet (10 mg total) by mouth daily before breakfast.  Dispense: 90 tablet; Refill: 1  Mixed hyperlipidemia Assessment & Plan: Not at goal.  Continue pravastatin 20 mg daily. Continue to work on eating a healthy diet and exercise.  Labs drawn today.    Orders: -     Lipid panel -     Pravastatin Sodium; Take 1 tablet (20 mg total) by mouth daily.  Dispense: 90 tablet; Refill: 1  Moderate persistent asthma with exacerbation Assessment & Plan: The current medical regimen is effective;  continue present plan and medications. Restart trelegy 200 mg one inhalation daily.   Orders: -     Trelegy Ellipta; Inhale 1 Inhalation into the lungs daily.  Dispense: 3 each; Refill: 3  Primary fibromyalgia syndrome Assessment & Plan: Continue duloxetine 60 mg before bed.    Orders: -     DULoxetine HCl; Take 1 capsule (60 mg total) by mouth daily.  Dispense: 90 capsule; Refill: 1 -     tiZANidine HCl; Take 1 tablet (2 mg total) by mouth at bedtime as needed for muscle spasms.  Dispense: 90 tablet; Refill:  0  Severe obesity with body mass index (BMI) of 36.0 to 36.9 with serious comorbidity [facility]) Assessment & Plan: Recommend continue to work on eating healthy diet and exercise. Comorbidities: diabetes and hyperlipidemia   Actinic keratosis Assessment & Plan: Cryotherapy 2 six second cycles.    Other orders -     Nystatin; Take 5 mLs (500,000 Units total) by mouth 4 (four) times daily as needed (thrush).  Dispense: 120 mL; Refill: 0     Meds ordered this encounter  Medications   DULoxetine (CYMBALTA) 60 MG capsule    Sig: Take 1 capsule (60 mg total) by mouth daily.    Dispense:  90 capsule    Refill:  1   empagliflozin (JARDIANCE) 10 MG TABS tablet    Sig: Take 1 tablet (10 mg total) by mouth daily before breakfast.    Dispense:  90 tablet    Refill:  1   hydrochlorothiazide (MICROZIDE) 12.5 MG capsule    Sig: Take 1 capsule (12.5 mg total) by mouth daily.    Dispense:  90 capsule    Refill:  1   nystatin (MYCOSTATIN) 100000 UNIT/ML suspension    Sig: Take 5 mLs (500,000 Units total) by mouth 4 (four) times daily as needed (thrush).    Dispense:  120 mL    Refill:  0   omeprazole (PRILOSEC) 40 MG capsule    Sig: Take 1 capsule (40 mg total) by mouth 2 (two) times daily.    Dispense:  180 capsule    Refill:  1   pravastatin (PRAVACHOL) 20 MG tablet    Sig: Take 1 tablet (20 mg total) by mouth daily.    Dispense:  90 tablet    Refill:  1   tiZANidine (ZANAFLEX) 2 MG tablet    Sig: Take 1 tablet (2 mg total) by mouth at bedtime as needed for muscle spasms.    Dispense:  90 tablet    Refill:  0   Fluticasone-Umeclidin-Vilant (TRELEGY ELLIPTA) 200-62.5-25 MCG/ACT AEPB    Sig: Inhale 1 Inhalation into the lungs daily.    Dispense:  3 each    Refill:  3    Orders Placed This Encounter  Procedures   CBC with Differential/Platelet   Comprehensive metabolic panel   Hemoglobin A1c   Lipid panel     Follow-up: Return in about 4 months (around 10/06/2022).   I,Marla I  Leal-Borjas,acting as a scribe for Blane Ohara, MD.,have documented all relevant documentation on the behalf of Blane Ohara, MD,as directed by  Blane Ohara, MD while in the presence of Blane Ohara, MD.   An After Visit Summary was printed and given to the patient.  Blane Ohara, MD [facility] [phone]

## 2022-06-06 ENCOUNTER — Telehealth: Payer: Self-pay | Admitting: Family Medicine

## 2022-06-06 ENCOUNTER — Other Ambulatory Visit: Payer: Self-pay | Admitting: Family Medicine

## 2022-06-06 DIAGNOSIS — I130 Hypertensive heart and chronic kidney disease with heart failure and stage 1 through stage 4 chronic kidney disease, or unspecified chronic kidney disease: Secondary | ICD-10-CM

## 2022-06-06 DIAGNOSIS — E782 Mixed hyperlipidemia: Secondary | ICD-10-CM

## 2022-06-06 LAB — CBC WITH DIFFERENTIAL/PLATELET
Basophils Absolute: 0.1 10*3/uL (ref 0.0–0.2)
Basos: 1 %
EOS (ABSOLUTE): 0.2 10*3/uL (ref 0.0–0.4)
Eos: 2 %
Hematocrit: 45 % (ref 34.0–46.6)
Hemoglobin: 14.3 g/dL (ref 11.1–15.9)
Immature Grans (Abs): 0.1 10*3/uL (ref 0.0–0.1)
Immature Granulocytes: 1 %
Lymphocytes Absolute: 3 10*3/uL (ref 0.7–3.1)
Lymphs: 37 %
MCH: 27.3 pg (ref 26.6–33.0)
MCHC: 31.8 g/dL (ref 31.5–35.7)
MCV: 86 fL (ref 79–97)
Monocytes Absolute: 0.8 10*3/uL (ref 0.1–0.9)
Monocytes: 10 %
Neutrophils Absolute: 3.8 10*3/uL (ref 1.4–7.0)
Neutrophils: 49 %
Platelets: 444 10*3/uL (ref 150–450)
RBC: 5.24 x10E6/uL (ref 3.77–5.28)
RDW: 14.8 % (ref 11.7–15.4)
WBC: 7.9 10*3/uL (ref 3.4–10.8)

## 2022-06-06 LAB — COMPREHENSIVE METABOLIC PANEL
ALT: 13 IU/L (ref 0–32)
AST: 21 IU/L (ref 0–40)
Albumin/Globulin Ratio: 1.7 (ref 1.2–2.2)
Albumin: 4.1 g/dL (ref 3.9–4.9)
Alkaline Phosphatase: 111 IU/L (ref 44–121)
BUN/Creatinine Ratio: 15 (ref 12–28)
BUN: 18 mg/dL (ref 8–27)
Bilirubin Total: 0.7 mg/dL (ref 0.0–1.2)
CO2: 27 mmol/L (ref 20–29)
Calcium: 9.9 mg/dL (ref 8.7–10.3)
Chloride: 99 mmol/L (ref 96–106)
Creatinine, Ser: 1.23 mg/dL — ABNORMAL HIGH (ref 0.57–1.00)
Globulin, Total: 2.4 g/dL (ref 1.5–4.5)
Glucose: 96 mg/dL (ref 70–99)
Potassium: 4.1 mmol/L (ref 3.5–5.2)
Sodium: 140 mmol/L (ref 134–144)
Total Protein: 6.5 g/dL (ref 6.0–8.5)
eGFR: 49 mL/min/{1.73_m2} — ABNORMAL LOW (ref >59)

## 2022-06-06 LAB — LIPID PANEL
Chol/HDL Ratio: 3.1 ratio (ref 0.0–4.4)
Cholesterol, Total: 163 mg/dL (ref 100–199)
HDL: 52 mg/dL (ref >39)
LDL Chol Calc (NIH): 90 mg/dL (ref 0–99)
Triglycerides: 116 mg/dL (ref 0–149)
VLDL Cholesterol Cal: 21 mg/dL (ref 5–40)

## 2022-06-06 LAB — HEMOGLOBIN A1C
Est. average glucose Bld gHb Est-mCnc: 146 mg/dL
Hgb A1c MFr Bld: 6.7 % — ABNORMAL HIGH (ref 4.8–5.6)

## 2022-06-06 LAB — CARDIOVASCULAR RISK ASSESSMENT

## 2022-06-06 NOTE — Telephone Encounter (Signed)
06/06/22- Statement from Certifying Physician for Therapeutic Shoes -[facility]

## 2022-06-07 ENCOUNTER — Other Ambulatory Visit: Payer: Self-pay | Admitting: Family Medicine

## 2022-06-10 ENCOUNTER — Ambulatory Visit (INDEPENDENT_AMBULATORY_CARE_PROVIDER_SITE_OTHER): Payer: Medicare HMO

## 2022-06-10 VITALS — BP 110/70 | HR 64 | Temp 97.2°F | Resp 14 | Ht 62.0 in | Wt 201.0 lb

## 2022-06-10 DIAGNOSIS — Z0000 Encounter for general adult medical examination without abnormal findings: Secondary | ICD-10-CM | POA: Diagnosis not present

## 2022-06-10 NOTE — Patient Instructions (Signed)

## 2022-06-10 NOTE — Progress Notes (Signed)
Subjective:   Andrea White is a 67 y.o. female who presents for Medicare Annual (Subsequent) preventive examination.  Review of Systems    Review of Systems  Constitutional:  Negative for chills, fever and malaise/fatigue.  HENT:  Negative for congestion and ear pain.   Respiratory:  Negative for cough.   Cardiovascular:  Negative for chest pain and leg swelling.  Gastrointestinal:  Negative for abdominal pain, constipation, diarrhea, nausea and vomiting.  Genitourinary:  Negative for frequency and urgency.  Musculoskeletal:  Negative for back pain and neck pain.    Cardiac Risk Factors include: advanced age (>72men, >33 women);diabetes mellitus;dyslipidemia     Objective:    Today's Vitals   06/05/22 1253 06/10/22 1401  BP:  110/70  Pulse:  64  Resp:  14  Temp:  (!) 97.2 F (36.2 C)  SpO2:  98%  Weight:  201 lb (91.2 kg)  Height:  5\' 2"  (1.575 m)  PainSc: 10-Worst pain ever    Body mass index is 36.76 kg/m.     06/10/2022    2:04 PM 05/30/2020    9:28 AM 05/26/2019    9:03 AM 09/25/2017   12:24 PM 06/06/2016   10:55 AM  Advanced Directives  Does Patient Have a Medical Advance Directive? Yes Yes Yes No No  Type of Estate agent of [place];Living will Healthcare Power of [place];Living will Healthcare Power of [place];Living will    Does patient want to make changes to medical advance directive?  No - Patient declined No - Patient declined    Copy of Healthcare Power of Attorney in Chart? Yes - validated most recent copy scanned in chart (See row information) No - copy requested No - copy requested    Would patient like information on creating a medical advance directive?    No - Patient declined No - Patient declined    Current Medications (verified) Outpatient Encounter Medications as of 06/10/2022  Medication Sig   albuterol (VENTOLIN HFA) 108 (90 Base) MCG/ACT inhaler INHALE 2 PUFFS INTO THE LUNGS EVERY 6 (SIX) HOURS AS NEEDED FOR WHEEZING  OR SHORTNESS OF BREATH.   ALPRAZolam (XANAX) 0.5 MG tablet TAKE 1/2 TO 1 TABLET EVERY 8 HOURS AS NEEDED   APPLE CIDER VINEGAR PO Take by mouth daily.   Biotin 29562 MCG TABS Take 1 tablet by mouth daily.   diclofenac Sodium (VOLTAREN) 1 % GEL Apply 4 g topically 4 (four) times daily as needed (knees, foot, or hand pain).   DULoxetine (CYMBALTA) 60 MG capsule Take 1 capsule (60 mg total) by mouth daily.   empagliflozin (JARDIANCE) 10 MG TABS tablet Take 1 tablet (10 mg total) by mouth daily before breakfast.   Fluticasone-Umeclidin-Vilant (TRELEGY ELLIPTA) 200-62.5-25 MCG/ACT AEPB Inhale 1 Inhalation into the lungs daily.   hydrochlorothiazide (MICROZIDE) 12.5 MG capsule TAKE 1 CAPSULE DAILY.   loratadine (CLARITIN) 10 MG tablet TAKE 1 TABLET EVERY DAY   meclizine (ANTIVERT) 25 MG tablet TAKE 1 TABLET TWICE DAILY   Multiple Vitamin (MULTIVITAMIN) tablet Take 1 tablet by mouth daily.   nystatin (MYCOSTATIN) 100000 UNIT/ML suspension Take 5 mLs (500,000 Units total) by mouth 4 (four) times daily as needed (thrush). Swish and spit.   omeprazole (PRILOSEC) 40 MG capsule Take 1 capsule (40 mg total) by mouth 2 (two) times daily.   oxybutynin (DITROPAN) 5 MG tablet Take 1 tablet (5 mg total) by mouth 3 (three) times daily.   pravastatin (PRAVACHOL) 20 MG tablet TAKE 1 TABLET DAILY.  tiZANidine (ZANAFLEX) 2 MG tablet Take 1 tablet (2 mg total) by mouth at bedtime as needed for muscle spasms.   traMADol (ULTRAM) 50 MG tablet Take 1 tablet (50 mg total) by mouth 3 (three) times daily as needed.   TRUE METRIX BLOOD GLUCOSE TEST test strip USE AS DIRECTED TO CHECK FASTING BLOOD SUGAR.   TRUEplus Lancets 33G MISC E11.69 use new lancet each time when checking FBS   valACYclovir (VALTREX) 1000 MG tablet Take 2 tablets (2,000 mg total) by mouth 2 (two) times daily as needed (x 1 day for each break out).   valsartan (DIOVAN) 80 MG tablet Take 1 tablet (80 mg total) by mouth daily. (Patient taking differently:  Take 40 mg by mouth daily.)   No facility-administered encounter medications on file as of 06/10/2022.    Allergies (verified) Morphine and codeine, Celebrex [celecoxib], Fenofibrate, Gabapentin, Lisinopril, Diazepam, Guaifenesin & derivatives, Lipitor [atorvastatin], and Sulfa antibiotics   History: Past Medical History:  Diagnosis Date   Allergy    Anxiety    Arthritis    Bradycardia, unspecified    Bruises easily    Calf pain    when walking   Fibromyalgia    Frequent headaches    GERD (gastroesophageal reflux disease)    H/O bladder problems    Hypertension    IBS (irritable bowel syndrome)    Localized edema    Mild persistent asthma, uncomplicated    Morbid (severe) obesity due to excess calories (HCC)    Muscle pain    Myocardial infarction (HCC)    Other hypotension    Ovarian cyst    Slow transit constipation    Solitary pulmonary nodule    Swelling    feet and legs   Type 2 diabetes mellitus with diabetic nephropathy (HCC)    Varicose veins of bilateral lower extremities with pain    Past Surgical History:  Procedure Laterality Date   BUNIONECTOMY     left foot 5th toe   CARDIAC CATHETERIZATION     carpel tunnel surgery     CESAREAN SECTION     CHOLECYSTECTOMY     GALLBLADDER SURGERY     TONSILLECTOMY     TUBAL LIGATION     VAGINAL HYSTERECTOMY  2003   LAVH/SR   Family History  Problem Relation Age of Onset   Heart disease Father    Hypertension Father    Stroke Father    Heart attack Father    Cerebrovascular Accident Father    Diabetes Sister    Kidney disease Sister    Migraines Daughter    Breast cancer Maternal Aunt    Cancer Maternal Aunt 39       breast   Social History   Socioeconomic History   Marital status: Married    Spouse name: Not on file   Number of children: 1   Years of education: Not on file   Highest education level: Not on file  Occupational History   Occupation: Disabled due to foot problems since 1993  Tobacco  Use   Smoking status: Never   Smokeless tobacco: Never  Vaping Use   Vaping Use: Never used  Substance and Sexual Activity   Alcohol use: No   Drug use: No   Sexual activity: Yes    Birth control/protection: Surgical    Comment: hyst  Other Topics Concern   Not on file  Social History Narrative   Not on file   Social Determinants of Health  Financial Resource Strain: High Risk (06/05/2022)   Overall Financial Resource Strain (CARDIA)    Difficulty of Paying Living Expenses: Very hard  Food Insecurity: No Food Insecurity (06/05/2022)   Hunger Vital Sign    Worried About Running Out of Food in the Last Year: Never true    Ran Out of Food in the Last Year: Never true  Transportation Needs: No Transportation Needs (06/05/2022)   PRAPARE - Administrator, Civil Service (Medical): No    Lack of Transportation (Non-Medical): No  Physical Activity: Insufficiently Active (06/05/2022)   Exercise Vital Sign    Days of Exercise per Week: 2 days    Minutes of Exercise per Session: 20 min  Stress: No Stress Concern Present (06/05/2022)   [employer] of Occupational Health - Occupational Stress Questionnaire    Feeling of Stress : Only a little  Social Connections: Moderately Isolated (06/05/2022)   Social Connection and Isolation Panel [NHANES]    Frequency of Communication with Friends and Family: Twice a week    Frequency of Social Gatherings with Friends and Family: Once a week    Attends Religious Services: Never    Database administrator or Organizations: No    Attends Engineer, structural: Never    Marital Status: Married    Tobacco Counseling Counseling given: Not Answered   Clinical Intake:  Pre-visit preparation completed: Yes  Pain : 0-10 Pain Score: 10-Worst pain ever Pain Type: Chronic pain Pain Location: Back Pain Orientation: Lower Pain Descriptors / Indicators: Dull, Sharp Pain Onset: Yesterday Pain Frequency: Constant Pain Relieving  Factors: Pain medication Effect of Pain on Daily Activities: She needs to take her time  Pain Relieving Factors: Pain medication  BMI - recorded: 36.76 Nutritional Status: BMI > 30  Obese Nutritional Risks: None Diabetes: Yes CBG done?: Yes CBG resulted in Enter/ Edit results?: Yes (175 mg/dl) Did pt. bring in CBG monitor from home?: No  How often do you need to have someone help you when you read instructions, pamphlets, or other written materials from your doctor or pharmacy?: 1 - Never What is the last grade level you completed in school?: High School diploma  Diabetic?yes  Interpreter Needed?: No      Activities of Daily Living    06/05/2022   12:53 PM  In your present state of health, do you have any difficulty performing the following activities:  Hearing? 0  Vision? 0  Difficulty concentrating or making decisions? 0  Walking or climbing stairs? 1  Dressing or bathing? 0  Doing errands, shopping? 0  Preparing Food and eating ? N  Using the Toilet? N  In the past six months, have you accidently leaked urine? N  Do you have problems with loss of bowel control? Y  Managing your Medications? N  Managing your Finances? N  Housekeeping or managing your Housekeeping? N    Patient Care Team: Blane Ohara, MD as PCP - General (Family Medicine) Linda Hedges, MD as Referring Physician (Orthopedic Surgery) [name], DPM as Consulting Physician (Podiatry) Silverio Lay, MD as Consulting Physician (Obstetrics and Gynecology) Albin Felling, OD (Optometry) Patricia Nettle, MD (Orthopedic Surgery)  Indicate any recent Medical Services you may have received from other than [facility] providers in the past year (date may be approximate).     Assessment:   This is a routine wellness examination for [place].  Hearing/Vision screen No results found.  Dietary issues and exercise activities discussed: Current Exercise Habits: The  patient does not participate in regular  exercise at present, Exercise limited by: orthopedic condition(s)   Goals Addressed             This Visit's Progress    Advanced Care Planning complete by 06/07/19   On track    Bring to office to copy and add to file     Increase physical activity   On track    Increase activity as tolerated     Manage My Medicine   On track    Timeframe:  Long-Range Goal Priority:  High Start Date:                             Expected End Date:                       Follow Up Date 04/11/2021    - call for medicine refill 2 or 3 days before it runs out - keep a list of all the medicines I take; vitamins and herbals too - use a pillbox to sort medicine    Why is this important?   These steps will help you keep on track with your medicines.   Notes:      Monitor and Manage My Blood Sugar-Diabetes Type 2   On track    Timeframe:  Long-Range Goal Priority:  High Start Date:                             Expected End Date:                       Follow Up Date 04/11/2021    - check blood sugar at prescribed times - take the blood sugar log to all doctor visits    Why is this important?   Checking your blood sugar at home helps to keep it from getting very high or very low.  Writing the results in a diary or log helps the doctor know how to care for you.  Your blood sugar log should have the time, date and the results.  Also, write down the amount of insulin or other medicine that you take.  Other information, like what you ate, exercise done and how you were feeling, will also be helpful.     Notes:       Depression Screen    06/05/2022   10:26 AM 06/06/2021    3:26 PM 04/03/2021    8:34 AM 10/04/2020    7:56 AM 09/05/2020    1:38 PM 05/30/2020    9:29 AM 04/18/2020   11:25 AM  PHQ 2/9 Scores  PHQ - 2 Score 0 0 0 0 0 0 0  PHQ- 9 Score 0    0 0 0    Fall Risk    06/05/2022   12:53 PM 06/05/2022   10:26 AM 06/06/2021    3:31 PM 04/03/2021    8:34 AM 10/04/2020    7:56 AM  Fall  Risk   Falls in the past year? 0 0 0 0 0  Number falls in past yr: 0 0 0 0 0  Injury with Fall? 0 0 0 0 0  Risk for fall due to : No Fall Risks No Fall Risks No Fall Risks  No Fall Risks  Follow up Falls prevention discussed Education provided Falls evaluation completed;Education provided Falls  evaluation completed Falls evaluation completed    FALL RISK PREVENTION PERTAINING TO THE HOME:  Any stairs in or around the home? Yes  If so, are there any without handrails? Yes  Home free of loose throw rugs in walkways, pet beds, electrical cords, etc? Yes  Adequate lighting in your home to reduce risk of falls? Yes   ASSISTIVE DEVICES UTILIZED TO PREVENT FALLS:  Life alert? No  Use of a cane, walker or w/c? No  Grab bars in the bathroom? No  Shower chair or bench in shower? No  Elevated toilet seat or a handicapped toilet? No   TIMED UP AND GO:  Was the test performed? Yes .  Length of time to ambulate 10 feet: 25 sec.   Gait slow and steady without use of assistive device  Cognitive Function:        06/10/2022    2:13 PM 06/06/2021    3:49 PM 05/30/2020    9:31 AM 05/26/2019    9:49 AM  6CIT Screen  What Year? 0 points 0 points 0 points 0 points  What month? 0 points 0 points 0 points 0 points  What time? 0 points 0 points 0 points 0 points  Count back from 20 0 points 0 points 0 points 0 points  Months in reverse 0 points 0 points 0 points 0 points  Repeat phrase 0 points 0 points 0 points 0 points  Total Score 0 points 0 points 0 points 0 points    Immunizations Immunization History  Administered Date(s) Administered   Influenza Inj Mdck Quad Pf 10/29/2019, 12/12/2020, 11/20/2021   Influenza-Unspecified 11/03/2017, 09/25/2018   Moderna Sars-Covid-2 Vaccination 07/27/2019, 08/23/2019   Pneumococcal Polysaccharide-23 11/06/2015   Tdap 08/29/2015, 11/20/2021    TDAP status: Up to date  Flu Vaccine status: Up to date  Pneumococcal vaccine status: Due, Education has  been provided regarding the importance of this vaccine. Advised may receive this vaccine at local pharmacy or Health Dept. Aware to provide a copy of the vaccination record if obtained from local pharmacy or Health Dept. Verbalized acceptance and understanding.  Covid-19 vaccine status: Completed vaccines  Qualifies for Shingles Vaccine? Yes   Zostavax completed No   Shingrix Completed?: No.    Education has been provided regarding the importance of this vaccine. Patient has been advised to call insurance company to determine out of pocket expense if they have not yet received this vaccine. Advised may also receive vaccine at local pharmacy or Health Dept. Verbalized acceptance and understanding.  Screening Tests Health Maintenance  Topic Date Due   Zoster Vaccines- Shingrix (1 of 2) Never done   COLONOSCOPY (Pts 45-61yrs Insurance coverage will need to be confirmed)  05/14/2016   PAP SMEAR-Modifier  02/21/2023 (Originally 01/22/2016)   COVID-19 Vaccine (3 - Moderna risk series) 06/26/2023 (Originally 09/20/2019)   INFLUENZA VACCINE  08/22/2022   OPHTHALMOLOGY EXAM  09/18/2022   HEMOGLOBIN A1C  12/06/2022   Diabetic kidney evaluation - Urine ACR  02/05/2023   Diabetic kidney evaluation - eGFR measurement  06/05/2023   FOOT EXAM  06/05/2023   Medicare Annual Wellness (AWV)  06/10/2023   MAMMOGRAM  08/21/2023   DEXA SCAN  05/05/2025   DTaP/Tdap/Td (3 - Td or Tdap) 11/21/2031   Hepatitis C Screening  Completed   HIV Screening  Completed   HPV VACCINES  Aged Out    Health Maintenance  Health Maintenance Due  Topic Date Due   Zoster Vaccines- Shingrix (1 of 2) Never  done   COLONOSCOPY (Pts 45-67yrs Insurance coverage will need to be confirmed)  05/14/2016    Colorectal cancer screening: Type of screening: Colonoscopy. Completed 05/15/2011. Repeat every 5 years  Mammogram status: Completed 06/10/2022. Repeat every year  Bone Density status: Completed 05/06/22. Results reflect: Bone  density results: OSTEOPENIA. Repeat every 3 years.  Lung Cancer Screening: (Low Dose CT Chest recommended if Age 33-80 years, 30 pack-year currently smoking OR have quit w/in 15years.) does not qualify.   Lung Cancer Screening Referral: n/a  Additional Screening:  Hepatitis C Screening: does qualify; Completed 09/142023  Vision Screening: Recommended annual ophthalmology exams for early detection of glaucoma and other disorders of the eye. Is the patient up to date with their annual eye exam?  Yes  Who is the provider or what is the name of the office in which the patient attends annual eye exams? Dr Charlena Cross If pt is not established with a provider, would they like to be referred to a provider to establish care? No .   Dental Screening: Recommended annual dental exams for proper oral hygiene  Community Resource Referral / Chronic Care Management: CRR required this visit?  No   CCM required this visit?  No      Plan:     I have personally reviewed and noted the following in the patient's chart:   Medical and social history Use of alcohol, tobacco or illicit drugs  Current medications and supplements including opioid prescriptions. Patient is currently taking opioid prescriptions. Information provided to patient regarding non-opioid alternatives. Patient advised to discuss non-opioid treatment plan with their provider. Functional ability and status Nutritional status Physical activity Advanced directives List of other physicians Hospitalizations, surgeries, and ER visits in previous 12 months Vitals Screenings to include cognitive, depression, and falls Referrals and appointments  In addition, I have reviewed and discussed with patient certain preventive protocols, quality metrics, and best practice recommendations. A written personalized care plan for preventive services as well as general preventive health recommendations were provided to patient.     Eugenie Norrie,  CMA   06/11/2022   Nurse Notes: Patient goals is continue to be active and take care of her backyard and her flowers. She is not able to do colonoscopy because her copay is $295 and she is not able to afford it right now. I spent 40 minutes face to face with patient.

## 2022-06-25 ENCOUNTER — Ambulatory Visit: Payer: Medicare HMO | Admitting: Podiatry

## 2022-06-25 DIAGNOSIS — Z79891 Long term (current) use of opiate analgesic: Secondary | ICD-10-CM | POA: Diagnosis not present

## 2022-06-25 DIAGNOSIS — M79674 Pain in right toe(s): Secondary | ICD-10-CM | POA: Diagnosis not present

## 2022-06-25 DIAGNOSIS — E1142 Type 2 diabetes mellitus with diabetic polyneuropathy: Secondary | ICD-10-CM

## 2022-06-25 DIAGNOSIS — L84 Corns and callosities: Secondary | ICD-10-CM | POA: Diagnosis not present

## 2022-06-25 DIAGNOSIS — M79675 Pain in left toe(s): Secondary | ICD-10-CM | POA: Diagnosis not present

## 2022-06-25 DIAGNOSIS — G894 Chronic pain syndrome: Secondary | ICD-10-CM | POA: Diagnosis not present

## 2022-06-25 DIAGNOSIS — B351 Tinea unguium: Secondary | ICD-10-CM

## 2022-06-25 DIAGNOSIS — M5136 Other intervertebral disc degeneration, lumbar region: Secondary | ICD-10-CM | POA: Diagnosis not present

## 2022-06-25 DIAGNOSIS — M791 Myalgia, unspecified site: Secondary | ICD-10-CM | POA: Diagnosis not present

## 2022-06-25 DIAGNOSIS — Z79899 Other long term (current) drug therapy: Secondary | ICD-10-CM | POA: Diagnosis not present

## 2022-06-25 NOTE — Progress Notes (Signed)
  Subjective:  Patient ID: Andrea White, female    DOB: 1956/01/10,  MRN: [id number]  Chief Complaint  Patient presents with   Diabetes    Diabetic foot care   Nail Problem    Thick painful toenails, 3 month follow up    67 y.o. female presents with the above complaint. History confirmed with patient.  Blood sugars well-controlled.  Nails are thickened elongated causing discomfort.  Also has calluses on multiple toes.  Objective:  Physical Exam: warm, good capillary refill, no trophic changes or ulcerative lesions, and normal DP and PT pulses. Left Foot: dystrophic yellowed discolored nail plates with subungual debris and calluses second and third toes Right Foot: dystrophic yellowed discolored nail plates with subungual debris and calluses second and third toes    1. Pain due to onychomycosis of toenails of White feet   2. Callus of foot   3. Type 2 diabetes mellitus with polyneuropathy (HCC)      Plan:  Patient was evaluated and treated and all questions answered.  Discussed the etiology and treatment options for the condition in detail with the patient.  She has done well with intermittent debridement in reducing her pain and improving function.  Recommended debridement of the nails today. Sharp and mechanical debridement performed of all painful and mycotic nails today. Nails debrided in length and thickness using a nail nipper to level of comfort. Discussed treatment options including appropriate shoe gear. Follow up as needed for painful nails.  All symptomatic hyperkeratoses were safely debrided with a sterile #15 blade to patient's level of comfort without incident.  Due to her diabetes and neuropathy this is medically necessary.  Return in about 3 months (around 09/25/2022) for at risk diabetic foot care.

## 2022-07-01 ENCOUNTER — Telehealth: Payer: Self-pay | Admitting: Podiatry

## 2022-07-01 NOTE — Telephone Encounter (Signed)
Lmom for pt to call back to schedule picking up her diabetic shoes

## 2022-07-17 ENCOUNTER — Other Ambulatory Visit: Payer: Medicare HMO

## 2022-07-30 ENCOUNTER — Ambulatory Visit: Payer: Medicare HMO

## 2022-07-30 DIAGNOSIS — M2041 Other hammer toe(s) (acquired), right foot: Secondary | ICD-10-CM | POA: Diagnosis not present

## 2022-07-30 DIAGNOSIS — M2042 Other hammer toe(s) (acquired), left foot: Secondary | ICD-10-CM

## 2022-07-30 DIAGNOSIS — E1142 Type 2 diabetes mellitus with diabetic polyneuropathy: Secondary | ICD-10-CM | POA: Diagnosis not present

## 2022-07-30 DIAGNOSIS — M2141 Flat foot [pes planus] (acquired), right foot: Secondary | ICD-10-CM | POA: Diagnosis not present

## 2022-07-30 DIAGNOSIS — M2142 Flat foot [pes planus] (acquired), left foot: Secondary | ICD-10-CM

## 2022-07-30 NOTE — Progress Notes (Signed)
Patient presents today to pick up diabetic shoes and insoles.  Patient was dispensed 1 pair of diabetic shoes and 3 pairs of foam casted diabetic insoles. Fit was satisfactory. Instructions for break-in and wear was reviewed and a copy was given to the patient.   Re-appointment for regularly scheduled diabetic foot care visits or if they should experience any trouble with the shoes or insoles.  

## 2022-08-15 ENCOUNTER — Other Ambulatory Visit: Payer: Self-pay | Admitting: Family Medicine

## 2022-08-15 DIAGNOSIS — J3089 Other allergic rhinitis: Secondary | ICD-10-CM

## 2022-08-15 MED ORDER — LORATADINE 10 MG PO TABS
10.0000 mg | ORAL_TABLET | Freq: Every day | ORAL | 3 refills | Status: DC
Start: 2022-08-15 — End: 2023-01-17

## 2022-08-26 ENCOUNTER — Other Ambulatory Visit: Payer: Self-pay

## 2022-08-26 ENCOUNTER — Telehealth: Payer: Self-pay

## 2022-08-26 DIAGNOSIS — E1142 Type 2 diabetes mellitus with diabetic polyneuropathy: Secondary | ICD-10-CM

## 2022-08-26 DIAGNOSIS — I5041 Acute combined systolic (congestive) and diastolic (congestive) heart failure: Secondary | ICD-10-CM

## 2022-08-26 MED ORDER — EMPAGLIFLOZIN 10 MG PO TABS
10.0000 mg | ORAL_TABLET | Freq: Every day | ORAL | 1 refills | Status: DC
Start: 2022-08-26 — End: 2022-12-20

## 2022-08-26 MED ORDER — HYDROCHLOROTHIAZIDE 12.5 MG PO CAPS
12.5000 mg | ORAL_CAPSULE | Freq: Every day | ORAL | 1 refills | Status: DC
Start: 2022-08-26 — End: 2023-01-09

## 2022-08-26 NOTE — Telephone Encounter (Signed)
Prescription Request  08/26/2022  What is the name of the medication or equipment? hydrochlorothiazide (MICROZIDE) 12.5 MG capsule and empagliflozin (JARDIANCE) 10 MG TABS tablet The patient is requesting a 90 day refill.  Have you contacted your pharmacy to request a refill? No   Which pharmacy would you like this sent to?   [facility] [address] - [place], [place] - [address] [place] [address] Phone: [phone] Fax: [phone]  Patient notified that their request is being sent to the clinical staff for review and that they should receive a response within 2 business days.   Please advise at [facility] [phone]

## 2022-09-25 ENCOUNTER — Ambulatory Visit: Payer: Medicare HMO | Admitting: Podiatry

## 2022-09-25 ENCOUNTER — Other Ambulatory Visit: Payer: Self-pay

## 2022-09-25 DIAGNOSIS — M5136 Other intervertebral disc degeneration, lumbar region: Secondary | ICD-10-CM | POA: Diagnosis not present

## 2022-09-25 DIAGNOSIS — G894 Chronic pain syndrome: Secondary | ICD-10-CM | POA: Diagnosis not present

## 2022-09-25 DIAGNOSIS — M791 Myalgia, unspecified site: Secondary | ICD-10-CM | POA: Diagnosis not present

## 2022-09-25 MED ORDER — PRAVASTATIN SODIUM 40 MG PO TABS: 40.0000 mg | ORAL_TABLET | Freq: Every day | ORAL | 0 refills | Status: DC

## 2022-09-25 MED ORDER — ALPRAZOLAM 0.5 MG PO TABS: ORAL_TABLET | ORAL | 2 refills | Status: DC

## 2022-09-25 MED ORDER — OXYBUTYNIN CHLORIDE 5 MG PO TABS: 5.0000 mg | ORAL_TABLET | Freq: Three times a day (TID) | ORAL | 0 refills | Status: DC

## 2022-09-25 NOTE — Telephone Encounter (Signed)
Patient called for refill request of the pravastatin and states she is not taking the 20 mg and that she is taking 40 mg of the medication. Corrected in the chart and put in request.

## 2022-09-26 ENCOUNTER — Other Ambulatory Visit: Payer: Self-pay | Admitting: Family Medicine

## 2022-09-26 ENCOUNTER — Telehealth: Payer: Self-pay

## 2022-09-26 ENCOUNTER — Other Ambulatory Visit: Payer: Self-pay

## 2022-09-26 MED ORDER — VALSARTAN 40 MG PO TABS: 40.0000 mg | ORAL_TABLET | Freq: Every day | ORAL | 1 refills | Status: DC

## 2022-09-26 NOTE — Telephone Encounter (Signed)
Called patient and she stated her BP are doing fine on the half 80 mg of valsartan.  Refill for valsartan 40 mg was sent to pharmacy in 90 day supply.

## 2022-09-26 NOTE — Telephone Encounter (Signed)
Patient called and stated she needed a refill on her valsartan, was unsure if she is suppose to be taking half a pill a day or the whole 80 mg. Patient stated at her last visit provider told her to take half a pill daily.  After looking in notes made patient aware, it said take 1 pill daily 80 mg. Please advise

## 2022-10-01 ENCOUNTER — Other Ambulatory Visit: Payer: Self-pay

## 2022-10-01 NOTE — Telephone Encounter (Signed)
Pharmacy called for clarification that patient is able to take xanax.  She has been intolerant to diazepam in the past but tolerates xanax without any problems.  Medication will be shipped as prescribed per Dr. Sedalia Muta.

## 2022-11-05 ENCOUNTER — Other Ambulatory Visit: Payer: Self-pay

## 2022-11-05 MED ORDER — MECLIZINE HCL 25 MG PO TABS: 25.0000 mg | ORAL_TABLET | Freq: Two times a day (BID) | ORAL | 1 refills | Status: DC

## 2022-11-25 ENCOUNTER — Telehealth: Payer: Self-pay | Admitting: Family Medicine

## 2022-11-25 NOTE — Telephone Encounter (Signed)
PT CALLED WANTING TO BE SEEN SOONER THAN HER SCHEDULED APPT AT 12/16/22. I TOLD HER I WOULD SEND A MESSAGE TO DR. COX IF SHE IS WILLING TO WORK HER INTO HER SCHEDULE SOONER. DR COX CAN/WHERE WOULD LIKE TO WORK HER INTO THE SCHEDULE. HER HUSBAND JUST PASSED AWAY.

## 2022-11-26 ENCOUNTER — Ambulatory Visit: Payer: Medicare HMO | Admitting: Family Medicine

## 2022-11-26 ENCOUNTER — Encounter: Payer: Self-pay | Admitting: Family Medicine

## 2022-11-26 ENCOUNTER — Telehealth: Payer: Self-pay | Admitting: Internal Medicine

## 2022-11-26 ENCOUNTER — Telehealth: Payer: Self-pay | Admitting: Family Medicine

## 2022-11-26 VITALS — BP 108/74 | HR 70 | Temp 97.5°F | Ht 62.0 in | Wt 196.0 lb

## 2022-11-26 DIAGNOSIS — I130 Hypertensive heart and chronic kidney disease with heart failure and stage 1 through stage 4 chronic kidney disease, or unspecified chronic kidney disease: Secondary | ICD-10-CM | POA: Diagnosis not present

## 2022-11-26 DIAGNOSIS — I5041 Acute combined systolic (congestive) and diastolic (congestive) heart failure: Secondary | ICD-10-CM | POA: Diagnosis not present

## 2022-11-26 DIAGNOSIS — Z23 Encounter for immunization: Secondary | ICD-10-CM | POA: Diagnosis not present

## 2022-11-26 DIAGNOSIS — K219 Gastro-esophageal reflux disease without esophagitis: Secondary | ICD-10-CM | POA: Diagnosis not present

## 2022-11-26 DIAGNOSIS — E1142 Type 2 diabetes mellitus with diabetic polyneuropathy: Secondary | ICD-10-CM | POA: Diagnosis not present

## 2022-11-26 DIAGNOSIS — J4541 Moderate persistent asthma with (acute) exacerbation: Secondary | ICD-10-CM

## 2022-11-26 DIAGNOSIS — J4540 Moderate persistent asthma, uncomplicated: Secondary | ICD-10-CM | POA: Diagnosis not present

## 2022-11-26 DIAGNOSIS — G44329 Chronic post-traumatic headache, not intractable: Secondary | ICD-10-CM | POA: Diagnosis not present

## 2022-11-26 DIAGNOSIS — N1832 Chronic kidney disease, stage 3b: Secondary | ICD-10-CM | POA: Diagnosis not present

## 2022-11-26 DIAGNOSIS — E782 Mixed hyperlipidemia: Secondary | ICD-10-CM

## 2022-11-26 MED ORDER — ALPRAZOLAM 0.5 MG PO TABS: ORAL_TABLET | ORAL | 2 refills | Status: DC

## 2022-11-26 MED ORDER — PRAVASTATIN SODIUM 20 MG PO TABS: 20.0000 mg | ORAL_TABLET | Freq: Every day | ORAL | 1 refills | Status: DC

## 2022-11-26 NOTE — Progress Notes (Unsigned)
Subjective:  Patient ID: Andrea White, female    DOB: December 05, 1955  Age: 67 y.o. MRN: [id number]  Chief Complaint  Patient presents with   Medical Management of Chronic Issues    HPI Diabetes:  Complications: neuropathy. Glucose checking: some days Glucose logs: She does not check frequently. Hypoglycemia: none Most recent A1C: 6.7 Current medications: Jardiance 10 mg once daily.  Foot checks: daily.   Hyperlipidemia: Current medications: Pravastatin 40 mg daily. Is supposed to be on 20 mg before bed, but was sent at higher dose and they are too difficult to cut.    Hypertension: Current medications: hydrochlorothiazide 12.5 mg once daily and Valsartan 40 mg daily.   GERD: on omeprazole 40 mg one oral twice daily.    Urge incontinence: oxybutynin 5 mg daily or not at all.     FM: on duloxetine 60 mg before bed.    Chronic Back Pain: Takes tramadol three times a day. Using diclofenac gel.    Allergic rhinitis: claritin.   Asthma: on trelegy one inhalation daily and albuterol 2 puffs as needed   Diet: eats what she wants, but this is fruits, vegetables, meat in the evening. Avoids fried foods and refined sugars.  Exercise:  Gardening.     Complaining of persistent headaches over the last two weeks. Worsening. No associated symptoms. Had some head trauma.     11/26/2022   10:06 AM 06/05/2022   10:26 AM 06/06/2021    3:26 PM 04/03/2021    8:34 AM 10/04/2020    7:56 AM  Depression screen PHQ 2/9  Decreased Interest  0 0 0 0  Down, Depressed, Hopeless 0 0 0 0 0  PHQ - 2 Score 0 0 0 0 0  Altered sleeping 0 0     Tired, decreased energy 0 0     Change in appetite 1 0     Feeling bad or failure about yourself  0 0     Trouble concentrating 0 0     Moving slowly or fidgety/restless 0 0     Suicidal thoughts 0 0     PHQ-9 Score 1 0     Difficult doing work/chores Not difficult at all Not difficult at all           11/26/2022   10:06 AM  Fall Risk   Falls in the  past year? 0  Number falls in past yr: 0  Injury with Fall? 0  Risk for fall due to : No Fall Risks  Follow up Falls evaluation completed    Patient Care Team: Blane Ohara, MD as PCP - General (Family Medicine) Linda Hedges, MD as Referring Physician (Orthopedic Surgery) [name], DPM as Consulting Physician (Podiatry) Silverio Lay, MD as Consulting Physician (Obstetrics and Gynecology) Albin Felling, OD (Optometry) Patricia Nettle, MD (Orthopedic Surgery)   Review of Systems  Constitutional:  Negative for chills, fatigue and fever.  HENT:  Negative for congestion, ear pain, rhinorrhea and sore throat.   Respiratory:  Negative for cough and shortness of breath.   Cardiovascular:  Negative for chest pain.  Gastrointestinal:  Negative for abdominal pain, constipation, diarrhea, nausea and vomiting.  Genitourinary:  Negative for dysuria and urgency.  Musculoskeletal:  Negative for back pain and myalgias.  Neurological:  Positive for headaches. Negative for dizziness, weakness and light-headedness.  Psychiatric/Behavioral:  Negative for dysphoric mood. The patient is not nervous/anxious.     Current Outpatient Medications on File Prior to Visit  Medication  Sig Dispense Refill   albuterol (VENTOLIN HFA) 108 (90 Base) MCG/ACT inhaler INHALE 2 PUFFS INTO THE LUNGS EVERY 6 (SIX) HOURS AS NEEDED FOR WHEEZING OR SHORTNESS OF BREATH. 1 each 3   APPLE CIDER VINEGAR PO Take by mouth daily.     Biotin 16109 MCG TABS Take 1 tablet by mouth daily.     diclofenac Sodium (VOLTAREN) 1 % GEL Apply 4 g topically 4 (four) times daily as needed (knees, foot, or hand pain). 1000 g 3   DULoxetine (CYMBALTA) 60 MG capsule Take 1 capsule (60 mg total) by mouth daily. 90 capsule 1   empagliflozin (JARDIANCE) 10 MG TABS tablet Take 1 tablet (10 mg total) by mouth daily before breakfast. 90 tablet 1   Fluticasone-Umeclidin-Vilant (TRELEGY ELLIPTA) 200-62.5-25 MCG/ACT AEPB Inhale 1 Inhalation into the  lungs daily. 3 each 3   hydrochlorothiazide (MICROZIDE) 12.5 MG capsule Take 1 capsule (12.5 mg total) by mouth daily. 90 capsule 1   loratadine (CLARITIN) 10 MG tablet Take 1 tablet (10 mg total) by mouth daily. 90 tablet 3   meclizine (ANTIVERT) 25 MG tablet Take 1 tablet (25 mg total) by mouth 2 (two) times daily. 180 tablet 1   Multiple Vitamin (MULTIVITAMIN) tablet Take 1 tablet by mouth daily.     nystatin (MYCOSTATIN) 100000 UNIT/ML suspension Take 5 mLs (500,000 Units total) by mouth 4 (four) times daily as needed (thrush). Swish and spit. 120 mL 0   omeprazole (PRILOSEC) 40 MG capsule Take 1 capsule (40 mg total) by mouth 2 (two) times daily. 180 capsule 1   oxybutynin (DITROPAN) 5 MG tablet Take 1 tablet (5 mg total) by mouth 3 (three) times daily. 270 tablet 0   tiZANidine (ZANAFLEX) 2 MG tablet Take 1 tablet (2 mg total) by mouth at bedtime as needed for muscle spasms. 90 tablet 0   traMADol (ULTRAM) 50 MG tablet Take 1 tablet (50 mg total) by mouth 3 (three) times daily as needed. 90 tablet 1   TRUE METRIX BLOOD GLUCOSE TEST test strip USE AS DIRECTED TO CHECK FASTING BLOOD SUGAR. 300 strip 2   TRUEplus Lancets 33G MISC E11.69 use new lancet each time when checking FBS 100 each 2   valACYclovir (VALTREX) 1000 MG tablet Take 2 tablets (2,000 mg total) by mouth 2 (two) times daily as needed (x 1 day for each break out). 20 tablet 0   valsartan (DIOVAN) 40 MG tablet Take 1 tablet (40 mg total) by mouth daily. 90 tablet 1   No current facility-administered medications on file prior to visit.   Past Medical History:  Diagnosis Date   Allergy    Anxiety    Arthritis    Bradycardia, unspecified    Bruises easily    Calf pain    when walking   Fibromyalgia    Frequent headaches    GERD (gastroesophageal reflux disease)    H/O bladder problems    Hypertension    IBS (irritable bowel syndrome)    Localized edema    Mild persistent asthma, uncomplicated    Morbid (severe) obesity  due to excess calories (HCC)    Muscle pain    Myocardial infarction (HCC)    Other hypotension    Ovarian cyst    Slow transit constipation    Solitary pulmonary nodule    Swelling    feet and legs   Type 2 diabetes mellitus with diabetic nephropathy (HCC)    Varicose veins of bilateral lower extremities with pain  Past Surgical History:  Procedure Laterality Date   BUNIONECTOMY     left foot 5th toe   CARDIAC CATHETERIZATION     carpel tunnel surgery     CESAREAN SECTION     CHOLECYSTECTOMY     GALLBLADDER SURGERY     TONSILLECTOMY     TUBAL LIGATION     VAGINAL HYSTERECTOMY  2003   LAVH/SR    Family History  Problem Relation Age of Onset   Heart disease Father    Hypertension Father    Stroke Father    Heart attack Father    Cerebrovascular Accident Father    Diabetes Sister    Kidney disease Sister    Migraines Daughter    Breast cancer Maternal Aunt    Cancer Maternal Aunt 31       breast   Social History   Socioeconomic History   Marital status: Widowed    Spouse name: Graelyn Bihl   Number of children: 1   Years of education: Not on file   Highest education level: Not on file  Occupational History   Occupation: Disabled due to foot problems since 1993  Tobacco Use   Smoking status: Never   Smokeless tobacco: Never  Vaping Use   Vaping status: Never Used  Substance and Sexual Activity   Alcohol use: No   Drug use: No   Sexual activity: Yes    Birth control/protection: Surgical    Comment: hyst  Other Topics Concern   Not on file  Social History Narrative   Not on file   Social Determinants of Health   Financial Resource Strain: High Risk (06/05/2022)   Overall Financial Resource Strain (CARDIA)    Difficulty of Paying Living Expenses: Very hard  Food Insecurity: No Food Insecurity (06/05/2022)   Hunger Vital Sign    Worried About Running Out of Food in the Last Year: Never true    Ran Out of Food in the Last Year: Never true   Transportation Needs: No Transportation Needs (06/05/2022)   PRAPARE - Administrator, Civil Service (Medical): No    Lack of Transportation (Non-Medical): No  Physical Activity: Insufficiently Active (06/05/2022)   Exercise Vital Sign    Days of Exercise per Week: 2 days    Minutes of Exercise per Session: 20 min  Stress: No Stress Concern Present (06/05/2022)   [employer] of Occupational Health - Occupational Stress Questionnaire    Feeling of Stress : Only a little  Social Connections: Moderately Isolated (06/05/2022)   Social Connection and Isolation Panel [NHANES]    Frequency of Communication with Friends and Family: Twice a week    Frequency of Social Gatherings with Friends and Family: Once a week    Attends Religious Services: Never    Diplomatic Services operational officer: No    Attends Engineer, structural: Never    Marital Status: Married    Objective:  BP 108/74   Pulse 70   Temp (!) 97.5 F (36.4 C)   Ht 5\' 2"  (1.575 m)   Wt 196 lb (88.9 kg)   SpO2 95%   BMI 35.85 kg/m      11/26/2022   10:03 AM 06/10/2022    2:01 PM 06/05/2022   10:12 AM  BP/Weight  Systolic BP 108 110 110  Diastolic BP 74 70 80  Wt. (Lbs) 196 201 198  BMI 35.85 kg/m2 36.76 kg/m2 36.21 kg/m2    Physical Exam Vitals reviewed.  Constitutional:      Appearance: Normal appearance. She is obese.  HENT:     Right Ear: Tympanic membrane normal.     Left Ear: Tympanic membrane normal.     Nose: Nose normal.     Mouth/Throat:     Pharynx: No posterior oropharyngeal erythema.  Neck:     Vascular: No carotid bruit.  Cardiovascular:     Rate and Rhythm: Normal rate and regular rhythm.     Heart sounds: Normal heart sounds.  Pulmonary:     Effort: Pulmonary effort is normal. No respiratory distress.     Breath sounds: Normal breath sounds.  Abdominal:     General: Abdomen is flat. Bowel sounds are normal.     Palpations: Abdomen is soft.     Tenderness: There  is no abdominal tenderness.  Neurological:     Mental Status: She is alert and oriented to person, place, and time.     Cranial Nerves: No cranial nerve deficit.  Psychiatric:        Mood and Affect: Mood normal.        Behavior: Behavior normal.     Diabetic Foot Exam - Simple   Simple Foot Form Diabetic Foot exam was performed with the following findings: Yes 11/26/2022 10:35 AM  Visual Inspection See comments: Yes Sensation Testing Intact to touch and monofilament testing bilaterally: Yes Pulse Check Posterior Tibialis and Dorsalis pulse intact bilaterally: Yes Comments Pes Planus      Lab Results  Component Value Date   WBC 8.2 11/26/2022   HGB 14.4 11/26/2022   HCT 44.3 11/26/2022   PLT 365 11/26/2022   GLUCOSE 97 11/26/2022   CHOL 162 11/26/2022   TRIG 169 (H) 11/26/2022   HDL 46 11/26/2022   LDLCALC 87 11/26/2022   ALT 16 11/26/2022   AST 25 11/26/2022   NA 144 11/26/2022   K 4.0 11/26/2022   CL 103 11/26/2022   CREATININE 1.04 (H) 11/26/2022   BUN 10 11/26/2022   CO2 23 11/26/2022   TSH 0.938 11/26/2022   HGBA1C 6.7 (H) 11/26/2022   MICROALBUR 80 09/05/2020      Assessment & Plan:    Chronic post-traumatic headache, not intractable Assessment & Plan: Check ct of brain.  Orders: -     CT HEAD WO CONTRAST ( ); Future  Hypertensive heart and kidney disease with acute combined systolic and diastolic congestive heart failure and stage 3b chronic kidney disease (HCC) Assessment & Plan: Well controlled.  No changes to medicines. hydrochlorothiazide 12.5 mg once daily and Valsartan 40 mg daily. Continue to work on eating a healthy diet and exercise.  Labs drawn today.     Gastroesophageal reflux disease without esophagitis Assessment & Plan: The current medical regimen is effective;  continue present plan and medications. omeprazole 40 mg one oral twice daily.    Diabetic polyneuropathy associated with type 2 diabetes mellitus  (HCC) Assessment & Plan: Control: Controlled Recommend check sugars fasting daily. Recommend check feet daily. Recommend annual eye exams. Medicines: Jardiance 10 mg once daily.  Continue to work on eating a healthy diet and exercise.  Labs drawn today.     Orders: -     CBC with Differential/Platelet -     Comprehensive metabolic panel -     Hemoglobin A1c  Mixed hyperlipidemia Assessment & Plan: The current medical regimen is effective;  continue present plan and medications. Continue pravastatin 20 mg daily. Continue to work on eating a healthy diet and exercise.  Labs drawn today.    Orders: -     Pravastatin Sodium; Take 1 tablet (20 mg total) by mouth daily.  Dispense: 90 tablet; Refill: 1 -     Lipid panel -     TSH  Moderate persistent asthma without complication Assessment & Plan: The current medical regimen is effective;  continue present plan and medications. Restart trelegy 200 mg one inhalation daily.    Encounter for immunization -     Flu Vaccine Trivalent High Dose (Fluad) -     Pneumococcal conjugate vaccine 20-valent  Other orders -     ALPRAZolam; TAKE 1/2 TO 1 TABLET EVERY 8 HOURS AS NEEDED  Dispense: 30 tablet; Refill: 2     Meds ordered this encounter  Medications   pravastatin (PRAVACHOL) 20 MG tablet    Sig: Take 1 tablet (20 mg total) by mouth daily.    Dispense:  90 tablet    Refill:  1   ALPRAZolam (XANAX) 0.5 MG tablet    Sig: TAKE 1/2 TO 1 TABLET EVERY 8 HOURS AS NEEDED    Dispense:  30 tablet    Refill:  2    Orders Placed This Encounter  Procedures   CT HEAD WO CONTRAST ( )   Flu Vaccine Trivalent High Dose (Fluad)   Pneumococcal conjugate vaccine 20-valent   CBC with Differential/Platelet   Comprehensive metabolic panel   Hemoglobin A1c   Lipid panel   TSH     Follow-up: Return in about 6 months (around 05/26/2023) for chronic follow up.   I,Katherina A Bramblett,acting as a scribe for Blane Ohara, MD.,have  documented all relevant documentation on the behalf of Blane Ohara, MD,as directed by  Blane Ohara, MD while in the presence of Blane Ohara, MD.   An After Visit Summary was printed and given to the patient.  Blane Ohara, MD [facility] [phone]

## 2022-11-26 NOTE — Telephone Encounter (Signed)
PT IS CALLING REGARDS OF HER HUSBAND WHO AS PASTED. SHE IS STATING HIS CHART IS NOT CORRECT. PLEASE ADVISE [phone]

## 2022-11-26 NOTE — Telephone Encounter (Signed)
I spoke with Dr Sedalia Muta and sent a message by [facility] app.

## 2022-11-26 NOTE — Telephone Encounter (Signed)
Hi Dr. Leonides Schanz,    Supervising Provider AM 11/26/2022    We received a referral for patient to have a colonoscopy. Patient last saw Dr. Charm Barges in 2022 and Dr. Loreta Ave in 2023. Patient is requesting to transfer care due to Dr. Charm Barges retiring. Also stated she did not receive friendly care from Dr. Loreta Ave. Patient's previous records are in [facility] media for you to review and advise on scheduling.    Thank you.

## 2022-11-26 NOTE — Telephone Encounter (Signed)
Andrea White stating that she got the [employer]. Andrea White stating that on the death certificate stated that the time of death is at 3:00 AM on 2022/11/25. Andrea White passed away at 10:03 PM on Nov 25, 2022. She needs this changed. Please advise.

## 2022-11-26 NOTE — Telephone Encounter (Signed)
Okay to schedule for direct colonoscopy for history of colon polyps.  Colonoscopy 05/15/11: Good prep. Two tubular adenomas removed from hepatic flexure with hot snare. Repeat colonoscopy recommended in 5 years

## 2022-11-27 LAB — CBC WITH DIFFERENTIAL/PLATELET
Basophils Absolute: 0.1 10*3/uL (ref 0.0–0.2)
Basos: 1 %
EOS (ABSOLUTE): 0.2 10*3/uL (ref 0.0–0.4)
Eos: 2 %
Hematocrit: 44.3 % (ref 34.0–46.6)
Hemoglobin: 14.4 g/dL (ref 11.1–15.9)
Immature Grans (Abs): 0 10*3/uL (ref 0.0–0.1)
Immature Granulocytes: 0 %
Lymphocytes Absolute: 3.2 10*3/uL — ABNORMAL HIGH (ref 0.7–3.1)
Lymphs: 39 %
MCH: 29.2 pg (ref 26.6–33.0)
MCHC: 32.5 g/dL (ref 31.5–35.7)
MCV: 90 fL (ref 79–97)
Monocytes Absolute: 0.8 10*3/uL (ref 0.1–0.9)
Monocytes: 9 %
Neutrophils Absolute: 4 10*3/uL (ref 1.4–7.0)
Neutrophils: 49 %
Platelets: 365 10*3/uL (ref 150–450)
RBC: 4.93 x10E6/uL (ref 3.77–5.28)
RDW: 14.1 % (ref 11.7–15.4)
WBC: 8.2 10*3/uL (ref 3.4–10.8)

## 2022-11-27 LAB — LIPID PANEL
Chol/HDL Ratio: 3.5 ratio (ref 0.0–4.4)
Cholesterol, Total: 162 mg/dL (ref 100–199)
HDL: 46 mg/dL (ref >39)
LDL Chol Calc (NIH): 87 mg/dL (ref 0–99)
Triglycerides: 169 mg/dL — ABNORMAL HIGH (ref 0–149)
VLDL Cholesterol Cal: 29 mg/dL (ref 5–40)

## 2022-11-27 LAB — COMPREHENSIVE METABOLIC PANEL
ALT: 16 [IU]/L (ref 0–32)
AST: 25 [IU]/L (ref 0–40)
Albumin: 3.9 g/dL (ref 3.9–4.9)
Alkaline Phosphatase: 87 [IU]/L (ref 44–121)
BUN/Creatinine Ratio: 10 — ABNORMAL LOW (ref 12–28)
BUN: 10 mg/dL (ref 8–27)
Bilirubin Total: 0.7 mg/dL (ref 0.0–1.2)
CO2: 23 mmol/L (ref 20–29)
Calcium: 9.2 mg/dL (ref 8.7–10.3)
Chloride: 103 mmol/L (ref 96–106)
Creatinine, Ser: 1.04 mg/dL — ABNORMAL HIGH (ref 0.57–1.00)
Globulin, Total: 2.3 g/dL (ref 1.5–4.5)
Glucose: 97 mg/dL (ref 70–99)
Potassium: 4 mmol/L (ref 3.5–5.2)
Sodium: 144 mmol/L (ref 134–144)
Total Protein: 6.2 g/dL (ref 6.0–8.5)
eGFR: 60 mL/min/{1.73_m2} (ref >59)

## 2022-11-27 LAB — HEMOGLOBIN A1C
Est. average glucose Bld gHb Est-mCnc: 146 mg/dL
Hgb A1c MFr Bld: 6.7 % — ABNORMAL HIGH (ref 4.8–5.6)

## 2022-11-27 LAB — TSH: TSH: 0.938 u[IU]/mL (ref 0.450–4.500)

## 2022-11-28 DIAGNOSIS — Z23 Encounter for immunization: Secondary | ICD-10-CM | POA: Insufficient documentation

## 2022-11-28 DIAGNOSIS — G44329 Chronic post-traumatic headache, not intractable: Secondary | ICD-10-CM | POA: Insufficient documentation

## 2022-11-28 NOTE — Assessment & Plan Note (Signed)
Check ct of brain

## 2022-11-28 NOTE — Assessment & Plan Note (Signed)
Control: Controlled Recommend check sugars fasting daily. Recommend check feet daily. Recommend annual eye exams. Medicines: Jardiance 10 mg once daily.  Continue to work on eating a healthy diet and exercise.  Labs drawn today.

## 2022-11-28 NOTE — Assessment & Plan Note (Signed)
The current medical regimen is effective;  continue present plan and medications. Restart trelegy 200 mg one inhalation daily.

## 2022-11-28 NOTE — Assessment & Plan Note (Signed)
The current medical regimen is effective;  continue present plan and medications. omeprazole 40 mg one oral twice daily.  

## 2022-11-28 NOTE — Telephone Encounter (Signed)
Called patient to schedule direct colonoscopy. Left voicemail.

## 2022-11-28 NOTE — Assessment & Plan Note (Signed)
Well controlled.  No changes to medicines. hydrochlorothiazide 12.5 mg once daily and Valsartan 40 mg daily. Continue to work on eating a healthy diet and exercise.  Labs drawn today.

## 2022-11-28 NOTE — Assessment & Plan Note (Signed)
The current medical regimen is effective;  continue present plan and medications. Continue pravastatin 20 mg daily. Continue to work on eating a healthy diet and exercise.  Labs drawn today.

## 2022-11-29 ENCOUNTER — Encounter: Payer: Self-pay | Admitting: Internal Medicine

## 2022-12-02 DIAGNOSIS — F4321 Adjustment disorder with depressed mood: Secondary | ICD-10-CM | POA: Diagnosis not present

## 2022-12-02 DIAGNOSIS — Z1330 Encounter for screening examination for mental health and behavioral disorders, unspecified: Secondary | ICD-10-CM | POA: Diagnosis not present

## 2022-12-02 DIAGNOSIS — Z6835 Body mass index (BMI) 35.0-35.9, adult: Secondary | ICD-10-CM | POA: Diagnosis not present

## 2022-12-02 DIAGNOSIS — Z1231 Encounter for screening mammogram for malignant neoplasm of breast: Secondary | ICD-10-CM | POA: Diagnosis not present

## 2022-12-02 DIAGNOSIS — N952 Postmenopausal atrophic vaginitis: Secondary | ICD-10-CM | POA: Diagnosis not present

## 2022-12-02 DIAGNOSIS — Z1211 Encounter for screening for malignant neoplasm of colon: Secondary | ICD-10-CM | POA: Diagnosis not present

## 2022-12-02 DIAGNOSIS — M8580 Other specified disorders of bone density and structure, unspecified site: Secondary | ICD-10-CM | POA: Diagnosis not present

## 2022-12-02 DIAGNOSIS — Z01419 Encounter for gynecological examination (general) (routine) without abnormal findings: Secondary | ICD-10-CM | POA: Diagnosis not present

## 2022-12-04 DIAGNOSIS — G44329 Chronic post-traumatic headache, not intractable: Secondary | ICD-10-CM | POA: Diagnosis not present

## 2022-12-05 ENCOUNTER — Ambulatory Visit: Payer: Medicare HMO | Admitting: Podiatry

## 2022-12-05 DIAGNOSIS — B351 Tinea unguium: Secondary | ICD-10-CM

## 2022-12-05 DIAGNOSIS — M79675 Pain in left toe(s): Secondary | ICD-10-CM | POA: Diagnosis not present

## 2022-12-05 DIAGNOSIS — S90112A Contusion of left great toe without damage to nail, initial encounter: Secondary | ICD-10-CM | POA: Diagnosis not present

## 2022-12-05 DIAGNOSIS — M79674 Pain in right toe(s): Secondary | ICD-10-CM

## 2022-12-05 NOTE — Telephone Encounter (Signed)
I have spoken with the patient and am trying to addend the death certificate for her husband.Dr. Sedalia Muta

## 2022-12-05 NOTE — Progress Notes (Signed)
Subjective:  Patient ID: Andrea White, female    DOB: 1955/04/12,  MRN: [id number]   Andrea White presents to clinic today for:  Chief Complaint  Patient presents with   Diabetes    DFC A1C - 6.7 LVPCP - LAST WEEK   Toe Injury    Left hallux, 4wks ago dropped a piece of wood on it still having pain and wants it looked at, bruising has gone down, swelling has went down some   . Patient notes nails are thick, discolored, elongated and painful in shoegear when trying to ambulate.  Approximately 1 month ago, patient dropped a piece of particle board on her left great toe.  Notes that she had pain at the time, as well as bruising but states that it has been improving.  PCP is Cox, Kirsten, MD.  Past Medical History:  Diagnosis Date   Allergy    Anxiety    Arthritis    Bradycardia, unspecified    Bruises easily    Calf pain    when walking   Fibromyalgia    Frequent headaches    GERD (gastroesophageal reflux disease)    H/O bladder problems    Hypertension    IBS (irritable bowel syndrome)    Localized edema    Mild persistent asthma, uncomplicated    Morbid (severe) obesity due to excess calories (HCC)    Muscle pain    Myocardial infarction (HCC)    Other hypotension    Ovarian cyst    Slow transit constipation    Solitary pulmonary nodule    Swelling    feet and legs   Type 2 diabetes mellitus with diabetic nephropathy (HCC)    Varicose veins of bilateral lower extremities with pain     Past Surgical History:  Procedure Laterality Date   BUNIONECTOMY     left foot 5th toe   CARDIAC CATHETERIZATION     carpel tunnel surgery     CESAREAN SECTION     CHOLECYSTECTOMY     GALLBLADDER SURGERY     TONSILLECTOMY     TUBAL LIGATION     VAGINAL HYSTERECTOMY  2003   LAVH/SR    Allergies  Allergen Reactions   Morphine And Codeine Other (See Comments)    Woozy, "out of body experience"   Celebrex [Celecoxib] Other (See Comments)    Patient does not  remember intolerance   Fenofibrate     Vertigo    Gabapentin Other (See Comments)    Confusion   Lisinopril Cough   Diazepam Other (See Comments)    Lightheaded, dizziness   Guaifenesin & Derivatives Other (See Comments)    Patient cannot remember specific issue, just made her feel "weird"   Lipitor [Atorvastatin] Rash   Sulfa Antibiotics Rash    Review of Systems: Negative except as noted in the HPI.  Objective:.  Upon expection of the left hallux nail Andrea White is a pleasant 67 y.o. female in NAD. AAO x 3.  Vascular Examination: Capillary refill time is 3-5 seconds to toes bilateral. Palpable pedal pulses b/l LE. Digital hair present b/l.  Skin temperature gradient WNL b/l. No varicosities b/l. No cyanosis noted b/l.   Dermatological Examination: Pedal skin with normal turgor, texture and tone b/l. No open wounds. No interdigital macerations b/l. Toenails x10 are 3mm thick, discolored, dystrophic with subungual debris. There is pain with compression of the nail plates.  They are elongated x10.  Upon inspection of  the left hallux nail, there is mild ecchymosis near the proximal nail fold.  The nail is not lytic at this time.  There is no active drainage noted.  There is no pain on palpation to the bones of the great toe.     Latest Ref Rng & Units 11/26/2022   10:44 AM 06/05/2022   10:58 AM 02/04/2022    8:58 AM  Hemoglobin A1C  Hemoglobin-A1c 4.8 - 5.6 % 6.7  6.7  6.7    Assessment/Plan: 1. Pain due to onychomycosis of toenails of White feet   2. Contusion of left great toe without damage to nail, initial encounter    The mycotic toenails were sharply debrided x10 with sterile nail nippers and a power debriding burr to decrease bulk/thickness and length.    Informed patient that the bruise may start to gradually looked darker as it grows out with the new nail growth on the left hallux.  From how the toe looks today, it does not appear that the nail will fall off on its own  as it continues to grow out over the next few weeks.  If she is having any continued issues with that she can call for an appointment for reevaluation.  Return in about 3 months (around 03/07/2023) for [facility].   Clerance Lav, DPM, FACFAS [facility]     [address] [place], [place] [address]                Office [phone]  Fax [phone]

## 2022-12-07 DIAGNOSIS — R519 Headache, unspecified: Secondary | ICD-10-CM | POA: Diagnosis not present

## 2022-12-09 ENCOUNTER — Encounter: Payer: Self-pay | Admitting: Family Medicine

## 2022-12-10 ENCOUNTER — Encounter: Payer: Self-pay | Admitting: Family Medicine

## 2022-12-10 NOTE — Telephone Encounter (Signed)
Pt would need to come in to discuss with Dr Sedalia Muta - usually do not start medications with minor memory issues however

## 2022-12-16 ENCOUNTER — Ambulatory Visit: Payer: Medicare HMO | Admitting: Family Medicine

## 2022-12-17 ENCOUNTER — Ambulatory Visit: Payer: Medicare HMO | Admitting: Family Medicine

## 2022-12-19 ENCOUNTER — Other Ambulatory Visit: Payer: Self-pay | Admitting: Family Medicine

## 2022-12-19 DIAGNOSIS — K219 Gastro-esophageal reflux disease without esophagitis: Secondary | ICD-10-CM

## 2022-12-19 DIAGNOSIS — M797 Fibromyalgia: Secondary | ICD-10-CM

## 2022-12-19 DIAGNOSIS — E1142 Type 2 diabetes mellitus with diabetic polyneuropathy: Secondary | ICD-10-CM

## 2022-12-23 NOTE — Telephone Encounter (Signed)
Patient made aware.

## 2022-12-25 ENCOUNTER — Ambulatory Visit (AMBULATORY_SURGERY_CENTER): Payer: Medicare HMO

## 2022-12-25 ENCOUNTER — Encounter: Payer: Self-pay | Admitting: Internal Medicine

## 2022-12-25 VITALS — Ht 62.0 in | Wt 196.0 lb

## 2022-12-25 DIAGNOSIS — Z860100 Personal history of colon polyps, unspecified: Secondary | ICD-10-CM

## 2022-12-25 MED ORDER — NA SULFATE-K SULFATE-MG SULF 17.5-3.13-1.6 GM/177ML PO SOLN
1.0000 | Freq: Once | ORAL | 0 refills | Status: AC
Start: 2022-12-25 — End: 2022-12-25

## 2022-12-25 NOTE — Progress Notes (Signed)
No egg or soy allergy known to patient  No issues known to pt with past sedation with any surgeries or procedures Patient denies ever being told they had issues or difficulty with intubation  No FH of Malignant Hyperthermia Pt is not on diet pills Pt is not on  home 02  Pt is not on blood thinners  Pt denies has with constipation and takes miralax No A fib or A flutter Have any cardiac testing pending--no Pt can ambulate  Pt denies use of chewing tobacco Discussed diabetic I weight loss medication holds Discussed NSAID holds Checked BMI Pt instructed to use Singlecare.com or GoodRx for a price reduction on prep  Patient's chart reviewed by Cathlyn Parsons CNRA prior to previsit and patient appropriate for the [facility].  Pre visit completed and red dot placed by patient's name on their procedure day (on provider's schedule).

## 2023-01-03 DIAGNOSIS — E1122 Type 2 diabetes mellitus with diabetic chronic kidney disease: Secondary | ICD-10-CM | POA: Diagnosis not present

## 2023-01-03 DIAGNOSIS — N1831 Chronic kidney disease, stage 3a: Secondary | ICD-10-CM | POA: Diagnosis not present

## 2023-01-03 DIAGNOSIS — I129 Hypertensive chronic kidney disease with stage 1 through stage 4 chronic kidney disease, or unspecified chronic kidney disease: Secondary | ICD-10-CM | POA: Diagnosis not present

## 2023-01-07 ENCOUNTER — Encounter: Payer: Self-pay | Admitting: Family Medicine

## 2023-01-07 NOTE — Telephone Encounter (Signed)
 Care team updated and letter sent for eye exam notes.

## 2023-01-09 ENCOUNTER — Other Ambulatory Visit: Payer: Self-pay | Admitting: Family Medicine

## 2023-01-09 DIAGNOSIS — I5041 Acute combined systolic (congestive) and diastolic (congestive) heart failure: Secondary | ICD-10-CM

## 2023-01-17 ENCOUNTER — Encounter: Payer: Self-pay | Admitting: Internal Medicine

## 2023-01-17 ENCOUNTER — Ambulatory Visit (AMBULATORY_SURGERY_CENTER): Payer: Medicare HMO | Admitting: Internal Medicine

## 2023-01-17 VITALS — BP 125/79 | HR 52 | Temp 97.5°F | Resp 9 | Ht 62.0 in | Wt 193.0 lb

## 2023-01-17 DIAGNOSIS — Z1211 Encounter for screening for malignant neoplasm of colon: Secondary | ICD-10-CM | POA: Diagnosis not present

## 2023-01-17 DIAGNOSIS — F419 Anxiety disorder, unspecified: Secondary | ICD-10-CM | POA: Diagnosis not present

## 2023-01-17 DIAGNOSIS — K648 Other hemorrhoids: Secondary | ICD-10-CM | POA: Diagnosis not present

## 2023-01-17 DIAGNOSIS — I10 Essential (primary) hypertension: Secondary | ICD-10-CM | POA: Diagnosis not present

## 2023-01-17 DIAGNOSIS — M797 Fibromyalgia: Secondary | ICD-10-CM | POA: Diagnosis not present

## 2023-01-17 DIAGNOSIS — K5730 Diverticulosis of large intestine without perforation or abscess without bleeding: Secondary | ICD-10-CM | POA: Diagnosis not present

## 2023-01-17 DIAGNOSIS — D120 Benign neoplasm of cecum: Secondary | ICD-10-CM

## 2023-01-17 DIAGNOSIS — K635 Polyp of colon: Secondary | ICD-10-CM

## 2023-01-17 DIAGNOSIS — D122 Benign neoplasm of ascending colon: Secondary | ICD-10-CM | POA: Diagnosis not present

## 2023-01-17 DIAGNOSIS — Z860100 Personal history of colon polyps, unspecified: Secondary | ICD-10-CM

## 2023-01-17 MED ORDER — SODIUM CHLORIDE 0.9 % IV SOLN: 500.0000 mL | Freq: Once | INTRAVENOUS | Status: DC

## 2023-01-17 NOTE — Progress Notes (Signed)
Report to PACU, RN, vss, BBS= Clear.  

## 2023-01-17 NOTE — Progress Notes (Signed)
GASTROENTEROLOGY PROCEDURE H&P NOTE   Primary Care Physician: Blane Ohara, MD    Reason for Procedure:   History of colon polyps  Plan:    Colonoscopy  Patient is appropriate for endoscopic procedure(s) in the ambulatory ([facility]) setting.  The nature of the procedure, as well as the risks, benefits, and alternatives were carefully and thoroughly reviewed with the patient. Ample time for discussion and questions allowed. The patient understood, was satisfied, and agreed to proceed.     HPI: Andrea White is a 67 y.o. female who presents for colonoscopy for history of colon polyps.  Colonoscopy 05/15/11: Good prep. Two tubular adenomas removed from hepatic flexure with hot snare. Repeat colonoscopy recommended in 5 years  Past Medical History:  Diagnosis Date   Allergy    Anxiety    Arthritis    Bradycardia, unspecified    Bruises easily    Calf pain    when walking   Fibromyalgia    Frequent headaches    GERD (gastroesophageal reflux disease)    H/O bladder problems    Hypertension    IBS (irritable bowel syndrome)    Localized edema    Mild persistent asthma, uncomplicated    Morbid (severe) obesity due to excess calories (HCC)    Muscle pain    Other hypotension    Ovarian cyst    Slow transit constipation    Solitary pulmonary nodule    Swelling    feet and legs   Type 2 diabetes mellitus with diabetic nephropathy (HCC)    Varicose veins of bilateral lower extremities with pain     Past Surgical History:  Procedure Laterality Date   BUNIONECTOMY     left foot 5th toe   CARDIAC CATHETERIZATION     carpel tunnel surgery     CESAREAN SECTION     CHOLECYSTECTOMY     GALLBLADDER SURGERY     TONSILLECTOMY     TUBAL LIGATION     VAGINAL HYSTERECTOMY  2003   LAVH/SR    Prior to Admission medications   Medication Sig Start Date End Date Taking? Authorizing Provider  albuterol (VENTOLIN HFA) 108 (90 Base) MCG/ACT inhaler INHALE 2 PUFFS INTO THE LUNGS  EVERY 6 (SIX) HOURS AS NEEDED FOR WHEEZING OR SHORTNESS OF BREATH. 12/31/21   Cox, Kirsten, MD  ALPRAZolam Prudy Feeler) 0.5 MG tablet TAKE 1/2 TO 1 TABLET EVERY 8 HOURS AS NEEDED 11/26/22   Cox, Kirsten, MD  Apoaequorin (PREVAGEN PO) Take by mouth.    [provider]  APPLE CIDER VINEGAR PO Take by mouth daily.    [provider]  Biotin 29562 MCG TABS Take 1 tablet by mouth daily.    [provider]  diclofenac Sodium (VOLTAREN) 1 % GEL Apply 4 g topically 4 (four) times daily as needed (knees, foot, or hand pain). 04/03/21   Cox, Fritzi Mandes, MD  DULoxetine (CYMBALTA) 60 MG capsule TAKE 1 CAPSULE DAILY 12/20/22   Cox, Fritzi Mandes, MD  Estradiol 10 MCG TABS vaginal tablet Insert 1 tablet into the vagina twice a week    [provider]  Fluticasone-Umeclidin-Vilant (TRELEGY ELLIPTA) 200-62.5-25 MCG/ACT AEPB Inhale 1 Inhalation into the lungs daily. 06/05/22   Cox, Fritzi Mandes, MD  hydrochlorothiazide (MICROZIDE) 12.5 MG capsule TAKE 1 CAPSULE DAILY 01/09/23   Cox, Kirsten, MD  JARDIANCE 10 MG TABS tablet TAKE 1 TABLET DAILY BEFORE BREAKFAST 12/20/22   Cox, Kirsten, MD  loratadine (CLARITIN) 10 MG tablet Take 1 tablet (10 mg total) by mouth  daily. Patient not taking: Reported on 12/25/2022 08/15/22   CoxFritzi Mandes, MD  meclizine (ANTIVERT) 25 MG tablet Take 1 tablet (25 mg total) by mouth 2 (two) times daily. 11/05/22   CoxFritzi Mandes, MD  Multiple Vitamin (MULTIVITAMIN) tablet Take 1 tablet by mouth daily.    [provider]  nystatin (MYCOSTATIN) 100000 UNIT/ML suspension Take 5 mLs (500,000 Units total) by mouth 4 (four) times daily as needed (thrush). Swish and spit. 06/07/22   Cox, Fritzi Mandes, MD  omeprazole (PRILOSEC) 40 MG capsule TAKE 1 CAPSULE TWICE DAILY 12/20/22   Cox, Kirsten, MD  oxybutynin (DITROPAN) 5 MG tablet Take 1 tablet (5 mg total) by mouth 3 (three) times daily. 09/25/22   Cox, Fritzi Mandes, MD  pravastatin (PRAVACHOL) 20 MG tablet Take 1 tablet (20 mg total) by mouth  daily. 11/26/22   Cox, Fritzi Mandes, MD  tiZANidine (ZANAFLEX) 2 MG tablet Take 1 tablet (2 mg total) by mouth at bedtime as needed for muscle spasms. Patient not taking: Reported on 12/25/2022 06/05/22   CoxFritzi Mandes, MD  traMADol (ULTRAM) 50 MG tablet Take 1 tablet (50 mg total) by mouth 3 (three) times daily as needed. 09/07/19   CoxFritzi Mandes, MD  TRUE METRIX BLOOD GLUCOSE TEST test strip USE AS DIRECTED TO CHECK FASTING BLOOD SUGAR. 03/22/21   Blane Ohara, MD  TRUEplus Lancets 33G MISC E11.69 use new lancet each time when checking FBS 01/03/20   Cox, Fritzi Mandes, MD  valACYclovir (VALTREX) 1000 MG tablet Take 2 tablets (2,000 mg total) by mouth 2 (two) times daily as needed (x 1 day for each break out). 02/04/22   Cox, Fritzi Mandes, MD  valsartan (DIOVAN) 40 MG tablet Take 1 tablet (40 mg total) by mouth daily. 09/26/22   Blane Ohara, MD    Current Outpatient Medications  Medication Sig Dispense Refill   ALPRAZolam (XANAX) 0.5 MG tablet TAKE 1/2 TO 1 TABLET EVERY 8 HOURS AS NEEDED 30 tablet 2   Biotin 16109 MCG TABS Take 1 tablet by mouth daily.     diclofenac Sodium (VOLTAREN) 1 % GEL Apply 4 g topically 4 (four) times daily as needed (knees, foot, or hand pain). 1000 g 3   DULoxetine (CYMBALTA) 60 MG capsule TAKE 1 CAPSULE DAILY 90 capsule 1   Estradiol 10 MCG TABS vaginal tablet Insert 1 tablet into the vagina twice a week     hydrochlorothiazide (MICROZIDE) 12.5 MG capsule TAKE 1 CAPSULE DAILY 90 capsule 1   JARDIANCE 10 MG TABS tablet TAKE 1 TABLET DAILY BEFORE BREAKFAST 90 tablet 1   meclizine (ANTIVERT) 25 MG tablet Take 1 tablet (25 mg total) by mouth 2 (two) times daily. 180 tablet 1   Multiple Vitamin (MULTIVITAMIN) tablet Take 1 tablet by mouth daily.     omeprazole (PRILOSEC) 40 MG capsule TAKE 1 CAPSULE TWICE DAILY 180 capsule 1   pravastatin (PRAVACHOL) 20 MG tablet Take 1 tablet (20 mg total) by mouth daily. 90 tablet 1   tiZANidine (ZANAFLEX) 2 MG tablet Take 1 tablet (2 mg total) by mouth at  bedtime as needed for muscle spasms. 90 tablet 0   traMADol (ULTRAM) 50 MG tablet Take 1 tablet (50 mg total) by mouth 3 (three) times daily as needed. 90 tablet 1   valsartan (DIOVAN) 40 MG tablet Take 1 tablet (40 mg total) by mouth daily. 90 tablet 1   albuterol (VENTOLIN HFA) 108 (90 Base) MCG/ACT inhaler INHALE 2 PUFFS INTO THE LUNGS EVERY 6 (SIX) HOURS AS NEEDED FOR WHEEZING OR  SHORTNESS OF BREATH. 1 each 3   nystatin (MYCOSTATIN) 100000 UNIT/ML suspension Take 5 mLs (500,000 Units total) by mouth 4 (four) times daily as needed (thrush). Swish and spit. 120 mL 0   oxybutynin (DITROPAN) 5 MG tablet Take 1 tablet (5 mg total) by mouth 3 (three) times daily. 270 tablet 0   TRUE METRIX BLOOD GLUCOSE TEST test strip USE AS DIRECTED TO CHECK FASTING BLOOD SUGAR. (Patient not taking: Reported on 01/17/2023) 300 strip 2   TRUEplus Lancets 33G MISC E11.69 use new lancet each time when checking FBS (Patient not taking: Reported on 01/17/2023) 100 each 2   valACYclovir (VALTREX) 1000 MG tablet Take 2 tablets (2,000 mg total) by mouth 2 (two) times daily as needed (x 1 day for each break out). 20 tablet 0   Current Facility-Administered Medications  Medication Dose Route Frequency Provider Last Rate Last Admin   0.9 %  sodium chloride infusion  500 mL Intravenous Once Imogene Burn, MD        Allergies as of 01/17/2023 - Review Complete 01/17/2023  Allergen Reaction Noted   Gabapentin Other (See Comments) 04/29/2019   Morphine and codeine Other (See Comments) 03/31/2013   Celebrex [celecoxib] Other (See Comments) 06/18/2011   Lisinopril Cough 03/15/2015   Diazepam Other (See Comments) 03/31/2013   Fenofibrate  05/15/2021   Guaifenesin & derivatives Other (See Comments) 09/26/2017   Lipitor [atorvastatin] Rash 04/29/2019   Sulfa antibiotics Rash 06/10/2011   Tape Other (See Comments) 01/17/2023    Family History  Problem Relation Age of Onset   Heart disease Father    Hypertension Father     Stroke Father    Heart attack Father    Cerebrovascular Accident Father    Diabetes Sister    Kidney disease Sister    Colon cancer Maternal Aunt    Breast cancer Maternal Aunt    Cancer Maternal Aunt 64       breast   Migraines Daughter    Colon polyps Neg Hx    Esophageal cancer Neg Hx    Rectal cancer Neg Hx    Stomach cancer Neg Hx     Social History   Socioeconomic History   Marital status: Widowed    Spouse name: Levona Kurdziel   Number of children: 1   Years of education: Not on file   Highest education level: Not on file  Occupational History   Occupation: Disabled due to foot problems since 1993  Tobacco Use   Smoking status: Never   Smokeless tobacco: Never  Vaping Use   Vaping status: Never Used  Substance and Sexual Activity   Alcohol use: No   Drug use: No   Sexual activity: Yes    Birth control/protection: Surgical    Comment: hyst  Other Topics Concern   Not on file  Social History Narrative   Not on file   Social Drivers of Health   Financial Resource Strain: High Risk (06/05/2022)   Overall Financial Resource Strain (CARDIA)    Difficulty of Paying Living Expenses: Very hard  Food Insecurity: No Food Insecurity (06/05/2022)   Hunger Vital Sign    Worried About Running Out of Food in the Last Year: Never true    Ran Out of Food in the Last Year: Never true  Transportation Needs: No Transportation Needs (06/05/2022)   PRAPARE - Administrator, Civil Service (Medical): No    Lack of Transportation (Non-Medical): No  Physical Activity: Insufficiently Active (06/05/2022)  Exercise Vital Sign    Days of Exercise per Week: 2 days    Minutes of Exercise per Session: 20 min  Stress: No Stress Concern Present (06/05/2022)   [employer] of Occupational Health - Occupational Stress Questionnaire    Feeling of Stress : Only a little  Social Connections: Moderately Isolated (06/05/2022)   Social Connection and Isolation Panel [NHANES]     Frequency of Communication with Friends and Family: Twice a week    Frequency of Social Gatherings with Friends and Family: Once a week    Attends Religious Services: Never    Database administrator or Organizations: No    Attends Banker Meetings: Never    Marital Status: Married  Catering manager Violence: Not At Risk (11/26/2022)   Humiliation, Afraid, Rape, and Kick questionnaire    Fear of Current or Ex-Partner: No    Emotionally Abused: No    Physically Abused: No    Sexually Abused: No    Physical Exam: Vital signs in last 24 hours: BP 115/75   Pulse 75   Temp (!) 97.5 F (36.4 C) (Temporal)   Ht 5\' 2"  (1.575 m)   Wt 193 lb (87.5 kg)   SpO2 95%   BMI 35.30 kg/m  GEN: NAD EYE: Sclerae anicteric ENT: MMM CV: Non-tachycardic Pulm: No increased work of breathing GI: Soft, NT/ND NEURO:  Alert & Oriented   Eulah Pont, MD [facility]  01/17/2023 10:52 AM

## 2023-01-17 NOTE — Progress Notes (Signed)
Called to room to assist during endoscopic procedure.  Patient ID and intended procedure confirmed with present staff. Received instructions for my participation in the procedure from the performing physician.  

## 2023-01-17 NOTE — Op Note (Signed)
[facility] Patient Name: Andrea White Procedure Date: 01/17/2023 10:54 AM MRN: [id number] Endoscopist: Madelyn Brunner Sheridan , , [id number] Age: 67 Referring MD:  Date of Birth: [birth year] Gender: Female Account #: [id number] Procedure:                Colonoscopy Indications:              High risk colon cancer surveillance: Personal                            history of colonic polyps Medicines:                Monitored Anesthesia Care Procedure:                Pre-Anesthesia Assessment:                           - Prior to the procedure, a History and Physical                            was performed, and patient medications and                            allergies were reviewed. The patient's tolerance of                            previous anesthesia was also reviewed. The risks                            and benefits of the procedure and the sedation                            options and risks were discussed with the patient.                            All questions were answered, and informed consent                            was obtained. Prior Anticoagulants: The patient has                            taken no anticoagulant or antiplatelet agents. ASA                            Grade Assessment: III - A patient with severe                            systemic disease. After reviewing the risks and                            benefits, the patient was deemed in satisfactory                            condition to undergo the procedure.  After obtaining informed consent, the colonoscope                            was passed under direct vision. Throughout the                            procedure, the patient's blood pressure, pulse, and                            oxygen saturations were monitored continuously. The                            [id number]-HQ190L Colonoscope [id number] was introduced                            through the anus and advanced  to the the cecum,                            identified by appendiceal orifice and ileocecal                            valve. The colonoscopy was performed without                            difficulty. The patient tolerated the procedure                            well. The quality of the bowel preparation was                            good. The ileocecal valve, appendiceal orifice, and                            rectum were photographed. Scope In: 11:00:17 AM Scope Out: 11:25:52 AM Scope Withdrawal Time: 0 hours 17 minutes 32 seconds  Total Procedure Duration: 0 hours 25 minutes 35 seconds  Findings:                 11 sessile polyps were found in the ascending colon                            and cecum. The polyps were 3 to 10 mm in size.                            These polyps were removed with a cold snare.                            Resection and retrieval were complete.                           Multiple diverticula were found in the sigmoid                            colon.  Non-bleeding internal hemorrhoids were found during                            retroflexion. Complications:            No immediate complications. Estimated Blood Loss:     Estimated blood loss was minimal. Impression:               - 11 3 to 10 mm polyps in the ascending colon and                            in the cecum, removed with a cold snare. Resected                            and retrieved.                           - Diverticulosis in the sigmoid colon.                           - Non-bleeding internal hemorrhoids. Recommendation:           - Discharge patient to home (with escort).                           - Await pathology results.                           - The findings and recommendations were discussed                            with the patient. Dr Particia Lather "Alan Ripper" Leonides Schanz,  01/17/2023 11:31:04 AM

## 2023-01-17 NOTE — Patient Instructions (Signed)

## 2023-01-17 NOTE — Progress Notes (Signed)
Pt's states no medical or surgical changes since previsit or office visit. 

## 2023-01-20 ENCOUNTER — Telehealth: Payer: Self-pay

## 2023-01-20 NOTE — Telephone Encounter (Signed)
Attempted to reach patient for post-procedure f/u call. NO answer. Left message for her to please not hesitate to call if she has any questions/concerns regarding her care.

## 2023-01-23 ENCOUNTER — Encounter: Payer: Self-pay | Admitting: Internal Medicine

## 2023-01-23 LAB — SURGICAL PATHOLOGY

## 2023-01-27 ENCOUNTER — Telehealth: Payer: Self-pay | Admitting: Family Medicine

## 2023-01-27 NOTE — Telephone Encounter (Unsigned)
 Copied from CRM [id number]. Topic: Clinical - Medication Refill >> Jan 27, 2023  1:42 PM Ivette P wrote: Most Recent Primary Care Visit:  Provider: [facility], KIRSTEN  Department: [facility]  Visit Type: OFFICE VISIT  Date: 11/26/2022  Medication: Calriten   Has the patient contacted their pharmacy? No (Agent: If no, request that the patient contact the pharmacy for the refill. If patient does not wish to contact the pharmacy document the reason why and proceed with request.) (Agent: If yes, when and what did the pharmacy advise?)  Is this the correct pharmacy for this prescription? Yes If no, delete pharmacy and type the correct one.  This is the patient's preferred pharmacy:  [facility] [address] - [place], [place] - [address] [place] [address] Phone: [phone] Fax: [phone]    Has the prescription been filled recently? No  Is the patient out of the medication? Yes  Has the patient been seen for an appointment in the last year OR does the patient have an upcoming appointment? Yes  Can we respond through MyChart? Yes  Agent: Please be advised that Rx refills may take up to 3 business days. We ask that you follow-up with your pharmacy.

## 2023-01-28 ENCOUNTER — Other Ambulatory Visit: Payer: Self-pay

## 2023-01-28 MED ORDER — LORATADINE 10 MG PO TABS: 10.0000 mg | ORAL_TABLET | Freq: Every day | ORAL | 11 refills | Status: DC

## 2023-01-28 NOTE — Telephone Encounter (Signed)
 Copied from CRM [id number]. Topic: Clinical - Medication Refill >> Jan 27, 2023  1:42 PM Ivette P wrote: Most Recent Primary Care Visit:  Provider: [facility], KIRSTEN  Department: [facility]  Visit Type: OFFICE VISIT  Date: 11/26/2022  Medication: Calriten   Has the patient contacted their pharmacy? No (Agent: If no, request that the patient contact the pharmacy for the refill. If patient does not wish to contact the pharmacy document the reason why and proceed with request.) (Agent: If yes, when and what did the pharmacy advise?)  Is this the correct pharmacy for this prescription? Yes If no, delete pharmacy and type the correct one.  This is the patient's preferred pharmacy:  [facility] [address] - [place], [place] - [address] [place] [address] Phone: [phone] Fax: [phone]    Has the prescription been filled recently? No  Is the patient out of the medication? Yes  Has the patient been seen for an appointment in the last year OR does the patient have an upcoming appointment? Yes  Can we respond through MyChart? Yes  Agent: Please be advised that Rx refills may take up to 3 business days. We ask that you follow-up with your pharmacy.

## 2023-02-28 ENCOUNTER — Encounter: Payer: Self-pay | Admitting: Family Medicine

## 2023-02-28 ENCOUNTER — Ambulatory Visit (INDEPENDENT_AMBULATORY_CARE_PROVIDER_SITE_OTHER): Payer: Medicare HMO | Admitting: Family Medicine

## 2023-02-28 VITALS — BP 128/74 | HR 82 | Temp 97.6°F | Ht 62.0 in | Wt 193.0 lb

## 2023-02-28 DIAGNOSIS — Z6835 Body mass index (BMI) 35.0-35.9, adult: Secondary | ICD-10-CM

## 2023-02-28 DIAGNOSIS — M797 Fibromyalgia: Secondary | ICD-10-CM

## 2023-02-28 DIAGNOSIS — E66812 Obesity, class 2: Secondary | ICD-10-CM | POA: Diagnosis not present

## 2023-02-28 DIAGNOSIS — E1142 Type 2 diabetes mellitus with diabetic polyneuropathy: Secondary | ICD-10-CM | POA: Diagnosis not present

## 2023-02-28 DIAGNOSIS — E782 Mixed hyperlipidemia: Secondary | ICD-10-CM | POA: Diagnosis not present

## 2023-02-28 DIAGNOSIS — F331 Major depressive disorder, recurrent, moderate: Secondary | ICD-10-CM | POA: Diagnosis not present

## 2023-02-28 DIAGNOSIS — I5041 Acute combined systolic (congestive) and diastolic (congestive) heart failure: Secondary | ICD-10-CM | POA: Diagnosis not present

## 2023-02-28 DIAGNOSIS — I130 Hypertensive heart and chronic kidney disease with heart failure and stage 1 through stage 4 chronic kidney disease, or unspecified chronic kidney disease: Secondary | ICD-10-CM

## 2023-02-28 DIAGNOSIS — K219 Gastro-esophageal reflux disease without esophagitis: Secondary | ICD-10-CM

## 2023-02-28 DIAGNOSIS — N1832 Chronic kidney disease, stage 3b: Secondary | ICD-10-CM | POA: Diagnosis not present

## 2023-02-28 NOTE — Progress Notes (Signed)
 Subjective:  Patient ID: Andrea White, female    DOB: [birth year]  Age: 68 y.o. MRN: [id number]  Chief Complaint  Patient presents with   Medical Management of Chronic Issues    HPI  Diabetes:  Complications: neuropathy. Glucose checking: some days Glucose logs: She does not check frequently. 101-129 Hypoglycemia: none Most recent A1C: 6.7 Current medications: Jardiance  10 mg once daily.  Foot checks: daily.   Hyperlipidemia: Current medications: Pravastatin  40 mg daily. Hard to cut in half.    Hypertension: Current medications: hydrochlorothiazide  12.5 mg once daily and Valsartan  40 mg daily.   GERD: on omeprazole  40 mg one oral twice daily.    Urge incontinence: oxybutynin  5 mg daily or not at all.     FM: on duloxetine  60 mg before bed.    Chronic Back Pain: Takes tramadol  three times a day. Using diclofenac  gel.    Allergic rhinitis: claritin .   Asthma: not currently taking any inhalers. Not needing.   Diet: not eating well.   Depression:  Complaining of poor appetite, inadequate diet, and weight loss. She reports a lack of interest in eating, often subsisting on crackers and lunchables since her husband passed in early fall. Her family has expressed concern about her nutritional intake, with her granddaughter, a CNA, noting a potential lack of protein in her diet. She reports feelings of depression, particularly in the evenings. She describes a lack of interest in activities and a tendency to procrastinate, especially regarding tasks such as grocery shopping. She also mentions feelings of loneliness, particularly in the evenings when she is alone. Patient ran out of cymbalta  about 4 weeks ago. She had forgotten she had run out. Her symptoms have been worsening over the last 2-3 weeks.  In addition to these issues, the patient has been experiencing episodes of dizziness, which she likens to vertigo. These episodes can occur even when she is sitting still and are  accompanied by feelings of weakness and nausea.   The patient's recent weight loss, which she initially viewed as a positive development, may be contributing to her current health issues. She reports that she was proud of her weight loss, but her family has expressed concern about her diet and overall health.     02/28/2023    8:50 AM 11/26/2022   10:06 AM 06/05/2022   10:26 AM 06/06/2021    3:26 PM 04/03/2021    8:34 AM  Depression screen PHQ 2/9  Decreased Interest 1  0 0 0  Down, Depressed, Hopeless 2 0 0 0 0  PHQ - 2 Score 3 0 0 0 0  Altered sleeping 3 0 0    Tired, decreased energy 1 0 0    Change in appetite 2 1 0    Feeling bad or failure about yourself  0 0 0    Trouble concentrating 0 0 0    Moving slowly or fidgety/restless 1 0 0    Suicidal thoughts 0 0 0    PHQ-9 Score 10 1 0    Difficult doing work/chores Somewhat difficult Not difficult at all Not difficult at all          11/26/2022   10:06 AM  Fall Risk   Falls in the past year? 0  Number falls in past yr: 0  Injury with Fall? 0  Risk for fall due to : No Fall Risks  Follow up Falls evaluation completed    Patient Care Team: Sherre Clapper, MD as PCP -  General (Family Medicine) Larnell Purchase, MD as Referring Physician (Orthopedic Surgery) Stover, Titorya, DPM as Consulting Physician (Podiatry) Darcel Pool, MD as Consulting Physician (Obstetrics and Gynecology) Erasmo Bernardino BRAVO, OD (Optometry) Gust Royden ORN, MD (Orthopedic Surgery) Roney, Jenna, OD as Referring Physician (Optometry)   Review of Systems  Constitutional:  Negative for chills, fatigue and fever.  HENT:  Negative for congestion, ear pain, rhinorrhea and sore throat.   Respiratory:  Negative for cough and shortness of breath.   Cardiovascular:  Negative for chest pain.  Gastrointestinal:  Negative for abdominal pain, constipation, diarrhea, nausea and vomiting.  Genitourinary:  Negative for dysuria and urgency.  Musculoskeletal:  Negative for  back pain and myalgias.  Neurological:  Negative for dizziness, weakness, light-headedness and headaches.  Psychiatric/Behavioral:  Negative for dysphoric mood. The patient is not nervous/anxious.     Current Outpatient Medications on File Prior to Visit  Medication Sig Dispense Refill   albuterol  (VENTOLIN  HFA) 108 (90 Base) MCG/ACT inhaler INHALE 2 PUFFS INTO THE LUNGS EVERY 6 (SIX) HOURS AS NEEDED FOR WHEEZING OR SHORTNESS OF BREATH. 1 each 3   ALPRAZolam  (XANAX ) 0.5 MG tablet TAKE 1/2 TO 1 TABLET EVERY 8 HOURS AS NEEDED 30 tablet 2   Biotin 10000 MCG TABS Take 1 tablet by mouth daily.     diclofenac  Sodium (VOLTAREN ) 1 % GEL Apply 4 g topically 4 (four) times daily as needed (knees, foot, or hand pain). 1000 g 3   DULoxetine  (CYMBALTA ) 60 MG capsule TAKE 1 CAPSULE DAILY 90 capsule 1   Estradiol  10 MCG TABS vaginal tablet Insert 1 tablet into the vagina twice a week     hydrochlorothiazide  (MICROZIDE ) 12.5 MG capsule TAKE 1 CAPSULE DAILY 90 capsule 1   JARDIANCE  10 MG TABS tablet TAKE 1 TABLET DAILY BEFORE BREAKFAST 90 tablet 1   loratadine  (CLARITIN ) 10 MG tablet Take 1 tablet (10 mg total) by mouth daily. 30 tablet 11   meclizine  (ANTIVERT ) 25 MG tablet Take 1 tablet (25 mg total) by mouth 2 (two) times daily. 180 tablet 1   Multiple Vitamin (MULTIVITAMIN) tablet Take 1 tablet by mouth daily.     nystatin  (MYCOSTATIN ) 100000 UNIT/ML suspension Take 5 mLs (500,000 Units total) by mouth 4 (four) times daily as needed (thrush). Swish and spit. 120 mL 0   omeprazole  (PRILOSEC) 40 MG capsule TAKE 1 CAPSULE TWICE DAILY 180 capsule 1   oxybutynin  (DITROPAN ) 5 MG tablet Take 1 tablet (5 mg total) by mouth 3 (three) times daily. 270 tablet 0   pravastatin  (PRAVACHOL ) 20 MG tablet Take 1 tablet (20 mg total) by mouth daily. 90 tablet 1   tiZANidine  (ZANAFLEX ) 2 MG tablet Take 1 tablet (2 mg total) by mouth at bedtime as needed for muscle spasms. 90 tablet 0   traMADol  (ULTRAM ) 50 MG tablet Take 1  tablet (50 mg total) by mouth 3 (three) times daily as needed. 90 tablet 1   TRUE METRIX BLOOD GLUCOSE TEST test strip USE AS DIRECTED TO CHECK FASTING BLOOD SUGAR. (Patient not taking: Reported on 01/17/2023) 300 strip 2   TRUEplus Lancets 33G MISC E11.69 use new lancet each time when checking FBS (Patient not taking: Reported on 01/17/2023) 100 each 2   valACYclovir  (VALTREX ) 1000 MG tablet Take 2 tablets (2,000 mg total) by mouth 2 (two) times daily as needed (x 1 day for each break out). 20 tablet 0   valsartan  (DIOVAN ) 40 MG tablet Take 1 tablet (40 mg total) by mouth daily. 90  tablet 1   No current facility-administered medications on file prior to visit.   Past Medical History:  Diagnosis Date   Allergy    Anxiety    Arthritis    Bradycardia, unspecified    Bruises easily    Calf pain    when walking   Fibromyalgia    Frequent headaches    GERD (gastroesophageal reflux disease)    H/O bladder problems    Hypertension    IBS (irritable bowel syndrome)    Localized edema    Mild persistent asthma, uncomplicated    Morbid (severe) obesity due to excess calories (HCC)    Muscle pain    Other hypotension    Ovarian cyst    Slow transit constipation    Solitary pulmonary nodule    Swelling    feet and legs   Type 2 diabetes mellitus with diabetic nephropathy (HCC)    Varicose veins of bilateral lower extremities with pain    Past Surgical History:  Procedure Laterality Date   BUNIONECTOMY     left foot 5th toe   CARDIAC CATHETERIZATION     carpel tunnel surgery     CESAREAN SECTION     CHOLECYSTECTOMY     GALLBLADDER SURGERY     TONSILLECTOMY     TUBAL LIGATION     VAGINAL HYSTERECTOMY  2003   LAVH/SR    Family History  Problem Relation Age of Onset   Heart disease Father    Hypertension Father    Stroke Father    Heart attack Father    Cerebrovascular Accident Father    Diabetes Sister    Kidney disease Sister    Colon cancer Maternal Aunt    Breast  cancer Maternal Aunt    Cancer Maternal Aunt 48       breast   Migraines Daughter    Colon polyps Neg Hx    Esophageal cancer Neg Hx    Rectal cancer Neg Hx    Stomach cancer Neg Hx    Social History   Socioeconomic History   Marital status: Widowed    Spouse name: Martina Brodbeck   Number of children: 1   Years of education: Not on file   Highest education level: 12th grade  Occupational History   Occupation: Disabled due to foot problems since 1993  Tobacco Use   Smoking status: Never   Smokeless tobacco: Never  Vaping Use   Vaping status: Never Used  Substance and Sexual Activity   Alcohol use: No   Drug use: No   Sexual activity: Yes    Birth control/protection: Surgical    Comment: hyst  Other Topics Concern   Not on file  Social History Narrative   Not on file   Social Drivers of Health   Financial Resource Strain: Patient Declined (02/27/2023)   Overall Financial Resource Strain (CARDIA)    Difficulty of Paying Living Expenses: Patient declined  Food Insecurity: Patient Declined (02/27/2023)   Hunger Vital Sign    Worried About Running Out of Food in the Last Year: Patient declined    Ran Out of Food in the Last Year: Patient declined  Transportation Needs: No Transportation Needs (02/27/2023)   PRAPARE - Administrator, Civil Service (Medical): No    Lack of Transportation (Non-Medical): No  Physical Activity: Sufficiently Active (02/27/2023)   Exercise Vital Sign    Days of Exercise per Week: 7 days    Minutes of Exercise per Session: 60  min  Stress: No Stress Concern Present (02/27/2023)   [employer] of Occupational Health - Occupational Stress Questionnaire    Feeling of Stress : Only a little  Social Connections: Socially Isolated (02/27/2023)   Social Connection and Isolation Panel [NHANES]    Frequency of Communication with Friends and Family: More than three times a week    Frequency of Social Gatherings with Friends and Family: More than  three times a week    Attends Religious Services: Never    Database Administrator or Organizations: No    Attends Banker Meetings: Never    Marital Status: Widowed    Objective:  BP 128/74   Pulse 82   Temp 97.6 F (36.4 C)   Ht 5' 2 (1.575 m)   Wt 193 lb (87.5 kg)   SpO2 95%   BMI 35.30 kg/m      02/28/2023    8:15 AM 01/17/2023   11:49 AM 01/17/2023   11:39 AM  BP/Weight  Systolic BP 128 125 122  Diastolic BP 74 79 75  Wt. (Lbs) 193    BMI 35.3 kg/m2      Physical Exam Vitals reviewed.  Constitutional:      Appearance: Normal appearance. She is normal weight.  Neck:     Vascular: No carotid bruit.  Cardiovascular:     Rate and Rhythm: Normal rate and regular rhythm.     Heart sounds: Normal heart sounds.  Pulmonary:     Effort: Pulmonary effort is normal. No respiratory distress.     Breath sounds: Normal breath sounds.  Abdominal:     General: Abdomen is flat. Bowel sounds are normal.     Palpations: Abdomen is soft.     Tenderness: There is no abdominal tenderness.  Neurological:     Mental Status: She is alert and oriented to person, place, and time.  Psychiatric:        Mood and Affect: Mood normal.        Behavior: Behavior normal.     Diabetic Foot Exam - Simple   Simple Foot Form  02/28/2023 10:02 PM  Visual Inspection See comments: Yes Sensation Testing Intact to touch and monofilament testing bilaterally: Yes Pulse Check Posterior Tibialis and Dorsalis pulse intact bilaterally: Yes Comments Flat feet      Lab Results  Component Value Date   WBC 8.1 02/28/2023   HGB 14.8 02/28/2023   HCT 45.3 02/28/2023   PLT 344 02/28/2023   GLUCOSE 104 (H) 02/28/2023   CHOL 165 02/28/2023   TRIG 173 (H) 02/28/2023   HDL 51 02/28/2023   LDLCALC 85 02/28/2023   ALT 16 02/28/2023   AST 25 02/28/2023   NA 141 02/28/2023   K 4.1 02/28/2023   CL 100 02/28/2023   CREATININE 1.17 (H) 02/28/2023   BUN 13 02/28/2023   CO2 24 02/28/2023    TSH 0.917 02/28/2023   HGBA1C 7.0 (H) 02/28/2023   MICROALBUR 80 09/05/2020      Assessment & Plan:    Hypertensive heart and kidney disease with acute combined systolic and diastolic congestive heart failure and stage 3b chronic kidney disease (HCC) Assessment & Plan: Well controlled.  No changes to medicines. hydrochlorothiazide  12.5 mg once daily and Valsartan  40 mg daily. Continue to work on eating a healthy diet and exercise.  Labs drawn today.    Orders: -     CBC with Differential/Platelet -     Comprehensive metabolic panel  Diabetic polyneuropathy  associated with type 2 diabetes mellitus (HCC) Assessment & Plan: Control: Controlled Recommend check sugars fasting daily. Recommend check feet daily. Recommend annual eye exams. Medicines: Jardiance  10 mg once daily.  Continue to work on eating a healthy diet and exercise.  Labs drawn today.     Orders: -     Hemoglobin A1c -     Microalbumin / creatinine urine ratio  Gastroesophageal reflux disease without esophagitis Assessment & Plan: The current medical regimen is effective;  continue present plan and medications. omeprazole  40 mg one oral twice daily.    Primary fibromyalgia syndrome Assessment & Plan: restart duloxetine  60 mg before bed.     Mixed hyperlipidemia Assessment & Plan: The current medical regimen is effective;  continue present plan and medications. Continue pravastatin  20 mg daily. Continue to work on eating a healthy diet and exercise.  Labs drawn today.    Orders: -     Lipid panel -     TSH  Moderate recurrent major depression (HCC) Assessment & Plan: Reports feeling depressed and hopeless several days a week, particularly in the evenings. Difficulty with sleep and poor appetite. No suicidal ideation. Currently off Duloxetine  for the past month due to a recall. -Resume Duloxetine  as soon as possible. -Consider counseling or support groups to help with feelings of loneliness  and depression.   Class 2 severe obesity due to excess calories with serious comorbidity and body mass index (BMI) of 35.0 to 35.9 in adult [facility]) Assessment & Plan: Recommend eating healthy food and eating 3 times a day.       No orders of the defined types were placed in this encounter.   Orders Placed This Encounter  Procedures   CBC with Differential/Platelet   Comprehensive metabolic panel   Hemoglobin A1c   Lipid panel   TSH   Microalbumin / creatinine urine ratio     Follow-up: Return in about 1 month (around 03/28/2023) for chronic follow up, 3 month follow up. SABRA Andrea Bowling A White,acting as a scribe for Abigail Free, MD.,have documented all relevant documentation on the behalf of Abigail Free, MD,as directed by  Abigail Free, MD while in the presence of Abigail Free, MD.   Andrea White,acting as a scribe for Abigail Free, MD.,have documented all relevant documentation on the behalf of Abigail Free, MD,as directed by  Abigail Free, MD while in the presence of Abigail Free, MD.    An After Visit Summary was printed and given to the patient.  I attest that I have reviewed this visit and agree with the plan scribed by my staff.   Abigail Free, MD [facility] [phone]

## 2023-03-01 DIAGNOSIS — F331 Major depressive disorder, recurrent, moderate: Secondary | ICD-10-CM | POA: Insufficient documentation

## 2023-03-01 LAB — COMPREHENSIVE METABOLIC PANEL
ALT: 16 [IU]/L (ref 0–32)
AST: 25 [IU]/L (ref 0–40)
Albumin: 4.2 g/dL (ref 3.9–4.9)
Alkaline Phosphatase: 89 [IU]/L (ref 44–121)
BUN/Creatinine Ratio: 11 — ABNORMAL LOW (ref 12–28)
BUN: 13 mg/dL (ref 8–27)
Bilirubin Total: 0.8 mg/dL (ref 0.0–1.2)
CO2: 24 mmol/L (ref 20–29)
Calcium: 9.7 mg/dL (ref 8.7–10.3)
Chloride: 100 mmol/L (ref 96–106)
Creatinine, Ser: 1.17 mg/dL — ABNORMAL HIGH (ref 0.57–1.00)
Globulin, Total: 2.7 g/dL (ref 1.5–4.5)
Glucose: 104 mg/dL — ABNORMAL HIGH (ref 70–99)
Potassium: 4.1 mmol/L (ref 3.5–5.2)
Sodium: 141 mmol/L (ref 134–144)
Total Protein: 6.9 g/dL (ref 6.0–8.5)
eGFR: 52 mL/min/{1.73_m2} — ABNORMAL LOW (ref >59)

## 2023-03-01 LAB — CBC WITH DIFFERENTIAL/PLATELET
Basophils Absolute: 0.1 10*3/uL (ref 0.0–0.2)
Basos: 1 %
EOS (ABSOLUTE): 0.2 10*3/uL (ref 0.0–0.4)
Eos: 3 %
Hematocrit: 45.3 % (ref 34.0–46.6)
Hemoglobin: 14.8 g/dL (ref 11.1–15.9)
Immature Grans (Abs): 0 10*3/uL (ref 0.0–0.1)
Immature Granulocytes: 0 %
Lymphocytes Absolute: 2.9 10*3/uL (ref 0.7–3.1)
Lymphs: 35 %
MCH: 29.5 pg (ref 26.6–33.0)
MCHC: 32.7 g/dL (ref 31.5–35.7)
MCV: 90 fL (ref 79–97)
Monocytes Absolute: 0.9 10*3/uL (ref 0.1–0.9)
Monocytes: 11 %
Neutrophils Absolute: 4 10*3/uL (ref 1.4–7.0)
Neutrophils: 50 %
Platelets: 344 10*3/uL (ref 150–450)
RBC: 5.02 x10E6/uL (ref 3.77–5.28)
RDW: 13.9 % (ref 11.7–15.4)
WBC: 8.1 10*3/uL (ref 3.4–10.8)

## 2023-03-01 LAB — LIPID PANEL
Chol/HDL Ratio: 3.2 {ratio} (ref 0.0–4.4)
Cholesterol, Total: 165 mg/dL (ref 100–199)
HDL: 51 mg/dL (ref >39)
LDL Chol Calc (NIH): 85 mg/dL (ref 0–99)
Triglycerides: 173 mg/dL — ABNORMAL HIGH (ref 0–149)
VLDL Cholesterol Cal: 29 mg/dL (ref 5–40)

## 2023-03-01 LAB — HEMOGLOBIN A1C
Est. average glucose Bld gHb Est-mCnc: 154 mg/dL
Hgb A1c MFr Bld: 7 % — ABNORMAL HIGH (ref 4.8–5.6)

## 2023-03-01 LAB — TSH: TSH: 0.917 u[IU]/mL (ref 0.450–4.500)

## 2023-03-01 LAB — MICROALBUMIN / CREATININE URINE RATIO
Creatinine, Urine: 74.2 mg/dL
Microalb/Creat Ratio: <4 mg/g{creat} (ref 0–29)
Microalbumin, Urine: <3 ug/mL

## 2023-03-01 NOTE — Assessment & Plan Note (Signed)
 Well controlled.  No changes to medicines. hydrochlorothiazide 12.5 mg once daily and Valsartan 40 mg daily. Continue to work on eating a healthy diet and exercise.  Labs drawn today.

## 2023-03-01 NOTE — Assessment & Plan Note (Signed)
Control: Controlled Recommend check sugars fasting daily. Recommend check feet daily. Recommend annual eye exams. Medicines: Jardiance 10 mg once daily.  Continue to work on eating a healthy diet and exercise.  Labs drawn today.

## 2023-03-01 NOTE — Assessment & Plan Note (Signed)
The current medical regimen is effective;  continue present plan and medications. omeprazole 40 mg one oral twice daily.  

## 2023-03-01 NOTE — Assessment & Plan Note (Signed)
 Reports feeling depressed and hopeless several days a week, particularly in the evenings. Difficulty with sleep and poor appetite. No suicidal ideation. Currently off Duloxetine  for the past month due to a recall. -Resume Duloxetine  as soon as possible. -Consider counseling or support groups to help with feelings of loneliness and depression.

## 2023-03-01 NOTE — Assessment & Plan Note (Addendum)
 restart duloxetine  60 mg before bed.

## 2023-03-01 NOTE — Assessment & Plan Note (Signed)
 Recommend eating healthy food and eating 3 times a day.

## 2023-03-01 NOTE — Assessment & Plan Note (Signed)
 The current medical regimen is effective;  continue present plan and medications. Continue pravastatin 20 mg daily. Continue to work on eating a healthy diet and exercise.  Labs drawn today.

## 2023-03-02 ENCOUNTER — Encounter: Payer: Self-pay | Admitting: Family Medicine

## 2023-03-06 DIAGNOSIS — E113293 Type 2 diabetes mellitus with mild nonproliferative diabetic retinopathy without macular edema, bilateral: Secondary | ICD-10-CM | POA: Diagnosis not present

## 2023-03-06 DIAGNOSIS — H5203 Hypermetropia, bilateral: Secondary | ICD-10-CM | POA: Diagnosis not present

## 2023-03-06 DIAGNOSIS — H2513 Age-related nuclear cataract, bilateral: Secondary | ICD-10-CM | POA: Diagnosis not present

## 2023-03-06 DIAGNOSIS — H524 Presbyopia: Secondary | ICD-10-CM | POA: Diagnosis not present

## 2023-03-07 ENCOUNTER — Ambulatory Visit (INDEPENDENT_AMBULATORY_CARE_PROVIDER_SITE_OTHER): Payer: Medicare HMO | Admitting: Podiatry

## 2023-03-07 DIAGNOSIS — B351 Tinea unguium: Secondary | ICD-10-CM

## 2023-03-07 DIAGNOSIS — M79675 Pain in left toe(s): Secondary | ICD-10-CM

## 2023-03-07 DIAGNOSIS — M79674 Pain in right toe(s): Secondary | ICD-10-CM | POA: Diagnosis not present

## 2023-03-07 DIAGNOSIS — E1142 Type 2 diabetes mellitus with diabetic polyneuropathy: Secondary | ICD-10-CM

## 2023-03-07 DIAGNOSIS — S90112D Contusion of left great toe without damage to nail, subsequent encounter: Secondary | ICD-10-CM | POA: Diagnosis not present

## 2023-03-07 NOTE — Progress Notes (Addendum)
 Subjective:  Patient ID: Andrea White, female    DOB: [birth year],  MRN: [id number]  Andrea White presents to clinic today for:  Chief Complaint  Patient presents with   [facility]    [facility] without callous. She is also requesting an inj in her right ankle. She has not had one in a long time but she gets them in her back. May not be able to do that today.  Last A1c was on 02/28/2023 it was 7.8 No anti coag, There is a paper for disabilty placard for DMV that needs to be signed.    Patient notes nails are thick, discolored, elongated and painful in shoegear when trying to ambulate.  Patient notes that she is still having some pain in her left great toe where she had her contusion over 3 months ago.  Unsure as to why patient did not schedule follow-up regarding the specific issue sooner.  Upon arrival, patient handed the medical assistant disability forms to be completed today for disability parking placard.  Her appointment today is only scheduled for nail care.  States that she has back issues and cannot bend very well and take care of her feet.  The patient got her last pair of diabetic shoes in 2024.  PCP is Cox, Kirsten, MD.  Past Medical History:  Diagnosis Date   Allergy    Anxiety    Arthritis    Bradycardia, unspecified    Bruises easily    Calf pain    when walking   Fibromyalgia    Frequent headaches    GERD (gastroesophageal reflux disease)    H/O bladder problems    Hypertension    IBS (irritable bowel syndrome)    Localized edema    Mild persistent asthma, uncomplicated    Morbid (severe) obesity due to excess calories (HCC)    Muscle pain    Other hypotension    Ovarian cyst    Slow transit constipation    Solitary pulmonary nodule    Swelling    feet and legs   Type 2 diabetes mellitus with diabetic nephropathy (HCC)    Varicose veins of bilateral lower extremities with pain     Past Surgical History:  Procedure Laterality Date   BUNIONECTOMY     left  foot 5th toe   CARDIAC CATHETERIZATION     carpel tunnel surgery     CESAREAN SECTION     CHOLECYSTECTOMY     GALLBLADDER SURGERY     TONSILLECTOMY     TUBAL LIGATION     VAGINAL HYSTERECTOMY  2003   LAVH/SR    Allergies  Allergen Reactions   Gabapentin Other (See Comments)    Confusion   Morphine And Codeine Other (See Comments)    Woozy, "out of body experience"   Celebrex [Celecoxib] Other (See Comments)    Patient does not remember intolerance   Lisinopril Cough   Diazepam  Other (See Comments)    Lightheaded, dizziness   Fenofibrate      Vertigo    Guaifenesin & Derivatives Other (See Comments)    Patient cannot remember specific issue, just made her feel "weird"   Lipitor [Atorvastatin] Rash   Sulfa Antibiotics Rash   Tape Other (See Comments)    Need to use paper tape - tape will tear skin   Review of Systems: Negative except as noted in the HPI.  Objective:  Andrea White is a pleasant 68 y.o. female in NAD. AAO x 3.  Vascular Examination: Capillary refill time is 3-5 seconds to toes bilateral. Palpable pedal pulses b/l LE. Digital hair present b/l.  Skin temperature gradient WNL b/l. No varicosities b/l. No cyanosis noted b/l.   Dermatological Examination: Pedal skin with normal turgor, texture and tone b/l. No open wounds. No interdigital macerations b/l. Toenails x10 are 3mm thick, discolored, dystrophic with subungual debris. There is pain with compression of the nail plates.  They are elongated x10  Musculoskeletal Examination: Pain on palpation to the dorsal aspect of the left hallux IPJ and MPJ and with attempted range of motion of the MPJ and IPJ.  No crepitus is noted.  No edema is appreciated or erythema in this area.  No ecchymosis is noted in this area.  There is decreased range of motion of the first MPJ on the left.  Neurological exam: Intact protective sensation bilateral forefoot but decreased vibratory and light touch sensation to the bilateral  forefoot and plantar foot.     Latest Ref Rng & Units 05/26/2023   11:32 AM 02/28/2023    9:28 AM 11/26/2022   10:44 AM 06/05/2022   10:58 AM  Hemoglobin A1C  Hemoglobin-A1c 4.8 - 5.6 % 7.0  7.0  6.7  6.7    Assessment/Plan: 1. Pain due to onychomycosis of toenails of both feet   2. Contusion of left great toe without damage to nail, subsequent encounter   3. Type 2 diabetes mellitus with polyneuropathy (HCC)    The mycotic toenails were sharply debrided x10 with sterile nail nippers and a power debriding burr to decrease bulk/thickness and length.    Informed the patient that I only complete disability paperwork for patients that have undergone surgery by myself or sustained a fracture where weightbearing/immobilization is required for 1 to 3 months.  If Dr. Lois Rink performed previous surgery and filled out this paperwork for her in the past, she can contact Dr. Lois Rink who is still in the area working for orthopedics and have her complete this at her office.  Otherwise she will need to speak to her PCP or her spine surgeon since she stated today that her disability is due to her back/spinal issues.  Informed patient that there was not additional time allotted for evaluation of the painful first ray on the left foot.  We will reschedule for x-ray evaluation of this area and see if a cortisone injection or other type of treatment plan would be necessary.  Will reschedule within the next 2 weeks for this.  Anytime in 2025 the patient would be covered for new diabetic shoes and inserts if she opted to proceed.  She can contact our office whenever she is interested in pursuing this.  Will reschedule for 3 months for her nail care.   Joe Murders, DPM, FACFAS [facility]     [address] [place], [place] [address]                Office [phone]  Fax [phone]

## 2023-03-10 ENCOUNTER — Other Ambulatory Visit: Payer: Self-pay | Admitting: Family Medicine

## 2023-03-10 ENCOUNTER — Encounter: Payer: Self-pay | Admitting: Family Medicine

## 2023-03-10 MED ORDER — ONDANSETRON 4 MG PO TBDP: 4.0000 mg | ORAL_TABLET | Freq: Three times a day (TID) | ORAL | 0 refills | Status: DC | PRN

## 2023-03-18 ENCOUNTER — Ambulatory Visit: Payer: Medicare HMO | Admitting: Family Medicine

## 2023-03-19 DIAGNOSIS — M79675 Pain in left toe(s): Secondary | ICD-10-CM | POA: Diagnosis not present

## 2023-03-19 DIAGNOSIS — S90122A Contusion of left lesser toe(s) without damage to nail, initial encounter: Secondary | ICD-10-CM | POA: Diagnosis not present

## 2023-03-19 DIAGNOSIS — M21969 Unspecified acquired deformity of unspecified lower leg: Secondary | ICD-10-CM | POA: Diagnosis not present

## 2023-03-21 ENCOUNTER — Ambulatory Visit: Payer: Medicare HMO | Admitting: Podiatry

## 2023-04-09 DIAGNOSIS — M47892 Other spondylosis, cervical region: Secondary | ICD-10-CM | POA: Diagnosis not present

## 2023-04-09 DIAGNOSIS — M791 Myalgia, unspecified site: Secondary | ICD-10-CM | POA: Diagnosis not present

## 2023-04-09 DIAGNOSIS — M542 Cervicalgia: Secondary | ICD-10-CM | POA: Diagnosis not present

## 2023-04-09 DIAGNOSIS — G894 Chronic pain syndrome: Secondary | ICD-10-CM | POA: Diagnosis not present

## 2023-04-10 ENCOUNTER — Ambulatory Visit: Payer: Medicare HMO | Admitting: Family Medicine

## 2023-04-25 ENCOUNTER — Ambulatory Visit: Payer: Self-pay

## 2023-04-25 ENCOUNTER — Ambulatory Visit

## 2023-04-25 VITALS — BP 102/70 | HR 72 | Temp 97.6°F | Resp 14 | Ht 62.0 in | Wt 185.0 lb

## 2023-04-25 DIAGNOSIS — M25521 Pain in right elbow: Secondary | ICD-10-CM | POA: Diagnosis not present

## 2023-04-25 DIAGNOSIS — M25421 Effusion, right elbow: Secondary | ICD-10-CM | POA: Insufficient documentation

## 2023-04-25 MED ORDER — TRIAMCINOLONE ACETONIDE 40 MG/ML IJ SUSP
40.0000 mg | Freq: Once | INTRAMUSCULAR | Status: AC
  Administered 2023-04-25: 40 mg via INTRAMUSCULAR

## 2023-04-25 MED ORDER — CEPHALEXIN 500 MG PO CAPS: 500.0000 mg | ORAL_CAPSULE | Freq: Three times a day (TID) | ORAL | 0 refills | Status: AC

## 2023-04-25 NOTE — Telephone Encounter (Signed)
 Chief Complaint: insect bite Symptoms: redness and swelling, pain, itching Frequency: yesterday Pertinent Negatives: Patient denies fever Disposition: [] ED /[] Urgent Care (no appt availability in office) / [x] Appointment(In office/virtual)/ []  [facility]/ [] Home Care/ [] Refused Recommended Disposition /[] [facility]/ []  Follow-up with PCP Additional Notes: Pt states she was In her flower bed yesterday and got bitten but something on her right elbow.  States she doesn't know what it was but swelling is the size of half and egg and has 3 dots in the middle. States pain 4/10 but itching is 10/10 and she has used benadryl and ointment on the bite   Copied from CRM [id number]. Topic: Clinical - Red Word Triage >> Apr 25, 2023  8:15 AM Alessandra Bevels wrote: Red Word that prompted transfer to Nurse Triage: Patient is calling to report that she was bitten by some insect in her flower bed on Wednesday evening. On her right elbow swollen and red with itching - bad during the night. Please advise Reason for Disposition  [1] Red or very tender (to touch) area AND [2] started over 24 hours after the bite  Answer Assessment - Initial Assessment Questions 1. TYPE of INSECT: "What type of insect was it?"      unknown 2. ONSET: "When did you get bitten?"      wedneday 3. LOCATION: "Where is the insect bite located?"      Left elbow 4. REDNESS: "Is the area red or pink?" If Yes, ask: "What size is area of redness?" (inches or cm). "When did the redness start?"     Half of an egg 5. PAIN: "Is there any pain?" If Yes, ask: "How bad is it?"  (Scale 1-10; or mild, moderate, severe)     4/10 6. ITCHING: "Does it itch?" If Yes, ask: "How bad is the itch?"    - MILD: doesn't interfere with normal activities   - MODERATE-SEVERE: interferes with work, school, sleep, or other activities      10/10 7. SWELLING: "How big is the swelling?" (inches, cm, or compare to coins)     Half and egg 8. OTHER  SYMPTOMS: "Do you have any other symptoms?"  (e.g., difficulty breathing, hives)     no  Protocols used: Insect Bite-A-AH

## 2023-04-25 NOTE — Progress Notes (Signed)
 Acute Office Visit  Subjective:    Patient ID: Andrea White, female    DOB: 01-04-1956, 68 y.o.   MRN: [id number]  Chief Complaint  Patient presents with   Insect Bite    Discussed the use of AI scribe software for clinical note transcription with the patient, who gave verbal consent to proceed.       HPI: Andrea White is a 68 year old female who presents with a red bump on her right elbow.  She developed a red bump on her right elbow after working in the flower beds on Wednesday evening. Initially, there was itching around 6:30 PM, which progressed to increased swelling and redness. The bump measures approximately three inches wide and two and a half inches long, is warm to the touch, and tender. She describes the sensation as more itchy than painful. She has been applying an itch relief cream and taking Benadryl to manage the symptoms. No trouble breathing, tongue swelling, or throat issues.  She mentions having 'onion skin' that rips easily, which was exacerbated by her granddaughter's puppy two days ago, leading to a skin tear that she has been managing with bandages. She confirms that her granddaughter's puppy is vaccinated, and she is up to date with her tetanus vaccination.  She has a history of allergic reactions to various substances, including lisinopril, sulfa, and Celebrex, but not amoxicillin. She typically uses an insect repellent spray regularly but did not use it on the day the symptoms began. She does not keep an EpiPen due to cost and expiration concerns.  Past Medical History:  Diagnosis Date   Allergy    Anxiety    Arthritis    Bradycardia, unspecified    Bruises easily    Calf pain    when walking   Fibromyalgia    Frequent headaches    GERD (gastroesophageal reflux disease)    H/O bladder problems    Hypertension    IBS (irritable bowel syndrome)    Localized edema    Mild persistent asthma, uncomplicated    Morbid (severe) obesity due to  excess calories (HCC)    Muscle pain    Other hypotension    Ovarian cyst    Slow transit constipation    Solitary pulmonary nodule    Swelling    feet and legs   Type 2 diabetes mellitus with diabetic nephropathy (HCC)    Varicose veins of bilateral lower extremities with pain     Past Surgical History:  Procedure Laterality Date   BUNIONECTOMY     left foot 5th toe   CARDIAC CATHETERIZATION     carpel tunnel surgery     CESAREAN SECTION     CHOLECYSTECTOMY     GALLBLADDER SURGERY     TONSILLECTOMY     TUBAL LIGATION     VAGINAL HYSTERECTOMY  2003   LAVH/SR    Family History  Problem Relation Age of Onset   Heart disease Father    Hypertension Father    Stroke Father    Heart attack Father    Cerebrovascular Accident Father    Diabetes Sister    Kidney disease Sister    Colon cancer Maternal Aunt    Breast cancer Maternal Aunt    Cancer Maternal Aunt 19       breast   Migraines Daughter    Colon polyps Neg Hx    Esophageal cancer Neg Hx    Rectal cancer Neg Hx  Stomach cancer Neg Hx     Social History   Socioeconomic History   Marital status: Widowed    Spouse name: Yeila Morro   Number of children: 1   Years of education: Not on file   Highest education level: 12th grade  Occupational History   Occupation: Disabled due to foot problems since 1993  Tobacco Use   Smoking status: Never   Smokeless tobacco: Never  Vaping Use   Vaping status: Never Used  Substance and Sexual Activity   Alcohol use: No   Drug use: No   Sexual activity: Yes    Birth control/protection: Surgical    Comment: hyst  Other Topics Concern   Not on file  Social History Narrative   Not on file   Social Drivers of Health   Financial Resource Strain: Patient Declined (02/27/2023)   Overall Financial Resource Strain (CARDIA)    Difficulty of Paying Living Expenses: Patient declined  Food Insecurity: Patient Declined (02/27/2023)   Hunger Vital Sign    Worried About  Running Out of Food in the Last Year: Patient declined    Ran Out of Food in the Last Year: Patient declined  Transportation Needs: No Transportation Needs (02/27/2023)   PRAPARE - Administrator, Civil Service (Medical): No    Lack of Transportation (Non-Medical): No  Physical Activity: Sufficiently Active (02/27/2023)   Exercise Vital Sign    Days of Exercise per Week: 7 days    Minutes of Exercise per Session: 60 min  Stress: No Stress Concern Present (02/27/2023)   [employer] of Occupational Health - Occupational Stress Questionnaire    Feeling of Stress : Only a little  Social Connections: Socially Isolated (02/27/2023)   Social Connection and Isolation Panel [NHANES]    Frequency of Communication with Friends and Family: More than three times a week    Frequency of Social Gatherings with Friends and Family: More than three times a week    Attends Religious Services: Never    Database administrator or Organizations: No    Attends Banker Meetings: Never    Marital Status: Widowed  Intimate Partner Violence: Not At Risk (11/26/2022)   Humiliation, Afraid, Rape, and Kick questionnaire    Fear of Current or Ex-Partner: No    Emotionally Abused: No    Physically Abused: No    Sexually Abused: No    Outpatient Medications Prior to Visit  Medication Sig Dispense Refill   albuterol (VENTOLIN HFA) 108 (90 Base) MCG/ACT inhaler INHALE 2 PUFFS INTO THE LUNGS EVERY 6 (SIX) HOURS AS NEEDED FOR WHEEZING OR SHORTNESS OF BREATH. 1 each 3   ALPRAZolam (XANAX) 0.5 MG tablet TAKE 1/2 TO 1 TABLET EVERY 8 HOURS AS NEEDED 30 tablet 2   Biotin 09811 MCG TABS Take 1 tablet by mouth daily.     diclofenac Sodium (VOLTAREN) 1 % GEL Apply 4 g topically 4 (four) times daily as needed (knees, foot, or hand pain). 1000 g 3   DULoxetine (CYMBALTA) 60 MG capsule TAKE 1 CAPSULE DAILY 90 capsule 1   Estradiol 10 MCG TABS vaginal tablet Insert 1 tablet into the vagina twice a week      hydrochlorothiazide (MICROZIDE) 12.5 MG capsule TAKE 1 CAPSULE DAILY 90 capsule 1   JARDIANCE 10 MG TABS tablet TAKE 1 TABLET DAILY BEFORE BREAKFAST 90 tablet 1   loratadine (CLARITIN) 10 MG tablet Take 1 tablet (10 mg total) by mouth daily. 30 tablet 11  meclizine (ANTIVERT) 25 MG tablet Take 1 tablet (25 mg total) by mouth 2 (two) times daily. 180 tablet 1   Multiple Vitamin (MULTIVITAMIN) tablet Take 1 tablet by mouth daily.     nystatin (MYCOSTATIN) 100000 UNIT/ML suspension Take 5 mLs (500,000 Units total) by mouth 4 (four) times daily as needed (thrush). Swish and spit. 120 mL 0   omeprazole (PRILOSEC) 40 MG capsule TAKE 1 CAPSULE TWICE DAILY 180 capsule 1   ondansetron (ZOFRAN-ODT) 4 MG disintegrating tablet Take 1 tablet (4 mg total) by mouth every 8 (eight) hours as needed for nausea or vomiting. 30 tablet 0   oxybutynin (DITROPAN) 5 MG tablet Take 1 tablet (5 mg total) by mouth 3 (three) times daily. 270 tablet 0   pravastatin (PRAVACHOL) 20 MG tablet Take 1 tablet (20 mg total) by mouth daily. 90 tablet 1   tiZANidine (ZANAFLEX) 2 MG tablet Take 1 tablet (2 mg total) by mouth at bedtime as needed for muscle spasms. 90 tablet 0   traMADol (ULTRAM) 50 MG tablet Take 1 tablet (50 mg total) by mouth 3 (three) times daily as needed. 90 tablet 1   TRUE METRIX BLOOD GLUCOSE TEST test strip USE AS DIRECTED TO CHECK FASTING BLOOD SUGAR. 300 strip 2   TRUEplus Lancets 33G MISC E11.69 use new lancet each time when checking FBS 100 each 2   valACYclovir (VALTREX) 1000 MG tablet Take 2 tablets (2,000 mg total) by mouth 2 (two) times daily as needed (x 1 day for each break out). 20 tablet 0   valsartan (DIOVAN) 40 MG tablet Take 1 tablet (40 mg total) by mouth daily. 90 tablet 1   No facility-administered medications prior to visit.    Allergies  Allergen Reactions   Gabapentin Other (See Comments)    Confusion   Morphine And Codeine Other (See Comments)    Woozy, "out of body experience"    Celebrex [Celecoxib] Other (See Comments)    Patient does not remember intolerance   Lisinopril Cough   Diazepam Other (See Comments)    Lightheaded, dizziness   Fenofibrate     Vertigo    Guaifenesin & Derivatives Other (See Comments)    Patient cannot remember specific issue, just made her feel "weird"   Lipitor [Atorvastatin] Rash   Sulfa Antibiotics Rash   Tape Other (See Comments)    Need to use paper tape - tape will tear skin    Review of Systems  Constitutional:  Negative for chills and fatigue.  Skin:  Positive for color change (redness right elbow) and wound.       Swelling on right elbow       Objective:        04/25/2023    9:31 AM 02/28/2023    8:15 AM 01/17/2023   11:49 AM  Vitals with BMI  Height 5\' 2"  5\' 2"    Weight 185 lbs 193 lbs   BMI 33.83 35.29   Systolic 102 128 161  Diastolic 70 74 79  Pulse 72 82 52    No data found.   Physical Exam Vitals and nursing note reviewed.  Constitutional:      Appearance: She is obese.  HENT:     Head: Normocephalic and atraumatic.  Cardiovascular:     Rate and Rhythm: Normal rate and regular rhythm.  Pulmonary:     Effort: Pulmonary effort is normal.     Breath sounds: Normal breath sounds.  Musculoskeletal:        General:  Normal range of motion.  Skin:    General: Skin is warm.     Comments: Right elbow bump/ swelling measuring 3 X 2.5 inch, warm, tender, indurated.  Another superficial skin tear noted on left forearm, not infected,  Neurological:     General: No focal deficit present.     Mental Status: She is alert.      Health Maintenance Due  Topic Date Due   Zoster Vaccines- Shingrix (1 of 2) Never done   OPHTHALMOLOGY EXAM  09/18/2022   Medicare Annual Wellness (AWV)  06/10/2023    There are no preventive care reminders to display for this patient.   Lab Results  Component Value Date   TSH 0.917 02/28/2023   Lab Results  Component Value Date   WBC 8.1 02/28/2023   HGB 14.8  02/28/2023   HCT 45.3 02/28/2023   MCV 90 02/28/2023   PLT 344 02/28/2023   Lab Results  Component Value Date   NA 141 02/28/2023   K 4.1 02/28/2023   CO2 24 02/28/2023   GLUCOSE 104 (H) 02/28/2023   BUN 13 02/28/2023   CREATININE 1.17 (H) 02/28/2023   BILITOT 0.8 02/28/2023   ALKPHOS 89 02/28/2023   AST 25 02/28/2023   ALT 16 02/28/2023   PROT 6.9 02/28/2023   ALBUMIN 4.2 02/28/2023   CALCIUM 9.7 02/28/2023   EGFR 52 (L) 02/28/2023   Lab Results  Component Value Date   CHOL 165 02/28/2023   Lab Results  Component Value Date   HDL 51 02/28/2023   Lab Results  Component Value Date   LDLCALC 85 02/28/2023   Lab Results  Component Value Date   TRIG 173 (H) 02/28/2023   Lab Results  Component Value Date   CHOLHDL 3.2 02/28/2023   Lab Results  Component Value Date   HGBA1C 7.0 (H) 02/28/2023       Assessment & Plan:  Pain and swelling of right elbow Assessment & Plan: Cellulitis of the right elbow Presents with a red, swollen, and itchy bump on the right elbow, likely due to an insect bite or sting while working in the flower beds. The area is warm to the touch and tender, measuring approximately three inches wide and two and a half inches long. Presentation is consistent with cellulitis, a bacterial skin infection.   She has been using Benadryl and anti-itch cream, but symptoms have persisted.  She requested for a Kenalog shot, as it is typically used for allergic reactions,as she had them before.  Due to likely cellulitis, prescribed an oral antibiotic to address the bacterial infection.  cephalosporin (Keflex) was chosen. Informed that if the swelling increases despite treatment, she should return or go to the ER. - Prescribe oral cephalosporin (Keflex) 500 mg TID to be picked up at [facility]. - Advise applying ice to the affected area to reduce swelling. - Instruct to monitor the size of the swelling and return if it increases despite treatment. -  Arrange for a nurse to apply a new dressing to the affected area.  Skin tear on the arm Has a skin tear on the arm caused by her granddaughter's puppy. The skin is described as fragile ('onion skin'), and the tear occurred two days ago. The area does not appear to be infected, and she is up to date on tetanus vaccination. Plans to rebandage the area with a non-stick dressing. - Apply ointment and a non-stick dressing to the skin tear. - Ensure the area remains  clean and monitor for signs of infection.  Orders: -     Triamcinolone Acetonide  Other orders -     Cephalexin; Take 1 capsule (500 mg total) by mouth 3 (three) times daily for 7 days.  Dispense: 21 capsule; Refill: 0     Assessment and Plan       Meds ordered this encounter  Medications   cephALEXin (KEFLEX) 500 MG capsule    Sig: Take 1 capsule (500 mg total) by mouth 3 (three) times daily for 7 days.    Dispense:  21 capsule    Refill:  0   triamcinolone acetonide (KENALOG-40) injection 40 mg    No orders of the defined types were placed in this encounter.    Follow-up: Return if symptoms worsen or fail to improve.  An After Visit Summary was printed and given to the patient.  Windell Moment, MD [facility] [phone]

## 2023-04-25 NOTE — Patient Instructions (Addendum)
 VISIT SUMMARY:  During your visit, we discussed the red bump on your right elbow and a skin tear on your arm. The bump appeared after working in the flower beds and has been itchy, swollen, and red. We also addressed the skin tear caused by your granddaughter's puppy.  YOUR PLAN:  -CELLULITIS OF THE RIGHT ELBOW: Cellulitis is a bacterial skin infection that causes redness, swelling, and tenderness. You likely developed this from an insect bite or sting while working in the flower beds. We have prescribed an oral antibiotic called Keflex to treat the infection. Please pick it up at [facility]. Additionally, apply ice to the affected area to reduce swelling and monitor the size of the swelling. If it increases despite treatment, return to the clinic or go to the ER. A nurse will also apply a new dressing to the affected area.  Kenalog shot given in office.   -SKIN TEAR ON THE ARM: A skin tear is a wound that occurs when the skin rips or tears. This happened due to your granddaughter's puppy. The area does not appear to be infected, and you are up to date on your tetanus vaccination. We recommend applying ointment and a non-stick dressing to the skin tear. Keep the area clean and monitor for signs of infection.  INSTRUCTIONS:  Please pick up your prescription for Keflex at [facility]. Apply ice to your elbow to reduce swelling and monitor the size of the swelling. If the swelling increases despite treatment, return to the clinic or go to the ER. A nurse will apply a new dressing to your elbow. For the skin tear on your arm, apply ointment and a non-stick dressing, and keep the area clean. Monitor for any signs of infection.

## 2023-04-25 NOTE — Assessment & Plan Note (Signed)
 Cellulitis of the right elbow Presents with a red, swollen, and itchy bump on the right elbow, likely due to an insect bite or sting while working in the flower beds. The area is warm to the touch and tender, measuring approximately three inches wide and two and a half inches long. Presentation is consistent with cellulitis, a bacterial skin infection.   She has been using Benadryl and anti-itch cream, but symptoms have persisted.  She requested for a Kenalog shot, as it is typically used for allergic reactions,as she had them before.  Due to likely cellulitis, prescribed an oral antibiotic to address the bacterial infection.  cephalosporin (Keflex) was chosen. Informed that if the swelling increases despite treatment, she should return or go to the ER. - Prescribe oral cephalosporin (Keflex) 500 mg TID to be picked up at [facility]. - Advise applying ice to the affected area to reduce swelling. - Instruct to monitor the size of the swelling and return if it increases despite treatment. - Arrange for a nurse to apply a new dressing to the affected area.  Skin tear on the arm Has a skin tear on the arm caused by her granddaughter's puppy. The skin is described as fragile ('onion skin'), and the tear occurred two days ago. The area does not appear to be infected, and she is up to date on tetanus vaccination. Plans to rebandage the area with a non-stick dressing. - Apply ointment and a non-stick dressing to the skin tear. - Ensure the area remains clean and monitor for signs of infection.

## 2023-05-05 DIAGNOSIS — M1711 Unilateral primary osteoarthritis, right knee: Secondary | ICD-10-CM | POA: Diagnosis not present

## 2023-05-07 ENCOUNTER — Other Ambulatory Visit: Payer: Self-pay | Admitting: Family Medicine

## 2023-05-12 ENCOUNTER — Other Ambulatory Visit: Payer: Self-pay | Admitting: Family Medicine

## 2023-05-12 DIAGNOSIS — K219 Gastro-esophageal reflux disease without esophagitis: Secondary | ICD-10-CM

## 2023-05-12 DIAGNOSIS — E1159 Type 2 diabetes mellitus with other circulatory complications: Secondary | ICD-10-CM

## 2023-05-12 DIAGNOSIS — E1142 Type 2 diabetes mellitus with diabetic polyneuropathy: Secondary | ICD-10-CM

## 2023-05-12 DIAGNOSIS — E782 Mixed hyperlipidemia: Secondary | ICD-10-CM

## 2023-05-12 DIAGNOSIS — M797 Fibromyalgia: Secondary | ICD-10-CM

## 2023-05-12 DIAGNOSIS — I152 Hypertension secondary to endocrine disorders: Secondary | ICD-10-CM

## 2023-05-13 ENCOUNTER — Ambulatory Visit: Admitting: Family Medicine

## 2023-05-13 ENCOUNTER — Encounter: Payer: Self-pay | Admitting: Family Medicine

## 2023-05-13 VITALS — BP 116/80 | HR 74 | Temp 98.1°F | Resp 16 | Ht 62.0 in | Wt 187.0 lb

## 2023-05-13 DIAGNOSIS — J208 Acute bronchitis due to other specified organisms: Secondary | ICD-10-CM

## 2023-05-13 MED ORDER — PREDNISONE 50 MG PO TABS: 50.0000 mg | ORAL_TABLET | Freq: Every day | ORAL | 0 refills | Status: DC

## 2023-05-13 MED ORDER — TRIAMCINOLONE ACETONIDE 40 MG/ML IJ SUSP
80.0000 mg | Freq: Once | INTRAMUSCULAR | Status: AC
  Administered 2023-05-13: 80 mg via INTRAMUSCULAR

## 2023-05-13 MED ORDER — AZITHROMYCIN 250 MG PO TABS: ORAL_TABLET | ORAL | 0 refills | Status: DC

## 2023-05-13 NOTE — Progress Notes (Signed)
 Acute Office Visit  Subjective:    Patient ID: Andrea White, female    DOB: 09-Jun-1955, 68 y.o.   MRN: [id number]  Chief Complaint  Patient presents with   Allergies   Cough    HPI: Patient is in today for dry cough since 6 days ago. She is concerned to have a bronchitis. She has been taking OTC medication for cough and it did not help her.  Taking benadryl and tussin. Denies earaches, sore throat. Had sinus pain over left side of her face over the weekend which is improving.   Past Medical History:  Diagnosis Date   Allergy    Anxiety    Arthritis    Bradycardia, unspecified    Bruises easily    Calf pain    when walking   Fibromyalgia    Frequent headaches    GERD (gastroesophageal reflux disease)    H/O bladder problems    Hypertension    IBS (irritable bowel syndrome)    Localized edema    Mild persistent asthma, uncomplicated    Morbid (severe) obesity due to excess calories (HCC)    Muscle pain    Other hypotension    Ovarian cyst    Slow transit constipation    Solitary pulmonary nodule    Swelling    feet and legs   Type 2 diabetes mellitus with diabetic nephropathy (HCC)    Varicose veins of bilateral lower extremities with pain     Past Surgical History:  Procedure Laterality Date   BUNIONECTOMY     left foot 5th toe   CARDIAC CATHETERIZATION     carpel tunnel surgery     CESAREAN SECTION     CHOLECYSTECTOMY     GALLBLADDER SURGERY     TONSILLECTOMY     TUBAL LIGATION     VAGINAL HYSTERECTOMY  2003   LAVH/SR    Family History  Problem Relation Age of Onset   Heart disease Father    Hypertension Father    Stroke Father    Heart attack Father    Cerebrovascular Accident Father    Diabetes Sister    Kidney disease Sister    Colon cancer Maternal Aunt    Breast cancer Maternal Aunt    Cancer Maternal Aunt 76       breast   Migraines Daughter    Colon polyps Neg Hx    Esophageal cancer Neg Hx    Rectal cancer Neg Hx    Stomach  cancer Neg Hx     Social History   Socioeconomic History   Marital status: Widowed    Spouse name: Cheryllynn Sarff   Number of children: 1   Years of education: Not on file   Highest education level: 12th grade  Occupational History   Occupation: Disabled due to foot problems since 1993  Tobacco Use   Smoking status: Never   Smokeless tobacco: Never  Vaping Use   Vaping status: Never Used  Substance and Sexual Activity   Alcohol use: No   Drug use: No   Sexual activity: Yes    Birth control/protection: Surgical    Comment: hyst  Other Topics Concern   Not on file  Social History Narrative   Not on file   Social Drivers of Health   Financial Resource Strain: Patient Declined (02/27/2023)   Overall Financial Resource Strain (CARDIA)    Difficulty of Paying Living Expenses: Patient declined  Food Insecurity: Patient Declined (02/27/2023)   Hunger  Vital Sign    Worried About Programme researcher, broadcasting/film/video in the Last Year: Patient declined    Ran Out of Food in the Last Year: Patient declined  Transportation Needs: No Transportation Needs (02/27/2023)   PRAPARE - Administrator, Civil Service (Medical): No    Lack of Transportation (Non-Medical): No  Physical Activity: Sufficiently Active (02/27/2023)   Exercise Vital Sign    Days of Exercise per Week: 7 days    Minutes of Exercise per Session: 60 min  Stress: No Stress Concern Present (02/27/2023)   [employer] of Occupational Health - Occupational Stress Questionnaire    Feeling of Stress : Only a little  Social Connections: Socially Isolated (02/27/2023)   Social Connection and Isolation Panel [NHANES]    Frequency of Communication with Friends and Family: More than three times a week    Frequency of Social Gatherings with Friends and Family: More than three times a week    Attends Religious Services: Never    Database administrator or Organizations: No    Attends Banker Meetings: Never    Marital Status:  Widowed  Intimate Partner Violence: Not At Risk (11/26/2022)   Humiliation, Afraid, Rape, and Kick questionnaire    Fear of Current or Ex-Partner: No    Emotionally Abused: No    Physically Abused: No    Sexually Abused: No    Outpatient Medications Prior to Visit  Medication Sig Dispense Refill   albuterol  (VENTOLIN  HFA) 108 (90 Base) MCG/ACT inhaler INHALE 2 PUFFS INTO THE LUNGS EVERY 6 (SIX) HOURS AS NEEDED FOR WHEEZING OR SHORTNESS OF BREATH. 1 each 3   ALPRAZolam  (XANAX ) 0.5 MG tablet TAKE 1/2 TO 1 TABLET EVERY 8 HOURS AS NEEDED 30 tablet 2   Biotin 16109 MCG TABS Take 1 tablet by mouth daily.     diclofenac  Sodium (VOLTAREN ) 1 % GEL Apply 4 g topically 4 (four) times daily as needed (knees, foot, or hand pain). 1000 g 3   DULoxetine  (CYMBALTA ) 60 MG capsule TAKE 1 CAPSULE DAILY 90 capsule 1   Estradiol 10 MCG TABS vaginal tablet Insert 1 tablet into the vagina twice a week     hydrochlorothiazide  (MICROZIDE ) 12.5 MG capsule TAKE 1 CAPSULE DAILY 90 capsule 1   JARDIANCE  10 MG TABS tablet TAKE 1 TABLET DAILY BEFORE BREAKFAST 90 tablet 1   loratadine  (CLARITIN ) 10 MG tablet Take 1 tablet (10 mg total) by mouth daily. 30 tablet 11   meclizine  (ANTIVERT ) 25 MG tablet Take 1 tablet (25 mg total) by mouth 2 (two) times daily. 180 tablet 1   Multiple Vitamin (MULTIVITAMIN) tablet Take 1 tablet by mouth daily.     nystatin  (MYCOSTATIN ) 100000 UNIT/ML suspension Take 5 mLs (500,000 Units total) by mouth 4 (four) times daily as needed (thrush). Swish and spit. 120 mL 0   omeprazole  (PRILOSEC) 40 MG capsule TAKE 1 CAPSULE TWICE DAILY 180 capsule 1   ondansetron  (ZOFRAN -ODT) 4 MG disintegrating tablet Take 1 tablet (4 mg total) by mouth every 8 (eight) hours as needed for nausea or vomiting. 30 tablet 0   oxybutynin  (DITROPAN ) 5 MG tablet Take 1 tablet (5 mg total) by mouth 3 (three) times daily. 270 tablet 0   pravastatin  (PRAVACHOL ) 20 MG tablet TAKE 1 TABLET DAILY 90 tablet 1   tiZANidine   (ZANAFLEX ) 2 MG tablet Take 1 tablet (2 mg total) by mouth at bedtime as needed for muscle spasms. 90 tablet 0   traMADol  (ULTRAM )  50 MG tablet Take 1 tablet (50 mg total) by mouth 3 (three) times daily as needed. 90 tablet 1   TRUE METRIX BLOOD GLUCOSE TEST test strip USE AS DIRECTED TO CHECK FASTING BLOOD SUGAR. 300 strip 2   TRUEplus Lancets 33G MISC E11.69 use new lancet each time when checking FBS 100 each 2   valACYclovir  (VALTREX ) 1000 MG tablet Take 2 tablets (2,000 mg total) by mouth 2 (two) times daily as needed (x 1 day for each break out). 20 tablet 0   valsartan  (DIOVAN ) 40 MG tablet Take 1 tablet (40 mg total) by mouth daily. 90 tablet 1   No facility-administered medications prior to visit.    Allergies  Allergen Reactions   Gabapentin Other (See Comments)    Confusion   Morphine And Codeine Other (See Comments)    Woozy, "out of body experience"   Celebrex [Celecoxib] Other (See Comments)    Patient does not remember intolerance   Lisinopril Cough   Diazepam  Other (See Comments)    Lightheaded, dizziness   Fenofibrate      Vertigo    Guaifenesin & Derivatives Other (See Comments)    Patient cannot remember specific issue, just made her feel "weird"   Lipitor [Atorvastatin] Rash   Sulfa Antibiotics Rash   Tape Other (See Comments)    Need to use paper tape - tape will tear skin    Review of Systems  Constitutional:  Positive for fatigue and fever. Negative for chills.  HENT:  Positive for congestion, postnasal drip and sinus pressure. Negative for ear pain and sore throat.   Respiratory:  Positive for cough. Negative for shortness of breath.   Cardiovascular:  Negative for chest pain and palpitations.  Gastrointestinal:  Negative for abdominal pain, constipation, diarrhea, nausea and vomiting.  Endocrine: Negative for polydipsia, polyphagia and polyuria.  Genitourinary:  Negative for difficulty urinating and dysuria.  Musculoskeletal:  Negative for arthralgias,  back pain and myalgias.  Skin:  Negative for rash.  Neurological:  Negative for headaches.  Psychiatric/Behavioral:  Negative for dysphoric mood. The patient is not nervous/anxious.        Objective:        05/13/2023   11:27 AM 04/25/2023    9:31 AM 02/28/2023    8:15 AM  Vitals with BMI  Height 5\' 2"  5\' 2"  5\' 2"   Weight 187 lbs 185 lbs 193 lbs  BMI 34.19 33.83 35.29  Systolic 116 102 469  Diastolic 80 70 74  Pulse 74 72 82    No data found.   Physical Exam Vitals reviewed.  Constitutional:      Appearance: Normal appearance.  HENT:     Right Ear: Tympanic membrane, ear canal and external ear normal.     Left Ear: Tympanic membrane, ear canal and external ear normal.     Nose: Nose normal.     Right Turbinates: Enlarged and swollen.     Left Turbinates: Enlarged and swollen.     Mouth/Throat:     Pharynx: Oropharynx is clear.  Cardiovascular:     Rate and Rhythm: Normal rate and regular rhythm.     Heart sounds: Normal heart sounds. No murmur heard. Pulmonary:     Effort: Pulmonary effort is normal. No respiratory distress.     Breath sounds: Normal breath sounds.  Lymphadenopathy:     Cervical: No cervical adenopathy.  Neurological:     Mental Status: She is alert and oriented to person, place, and time.  Psychiatric:  Mood and Affect: Mood normal.        Behavior: Behavior normal.     Health Maintenance Due  Topic Date Due   Zoster Vaccines- Shingrix (1 of 2) Never done   OPHTHALMOLOGY EXAM  09/18/2022   Medicare Annual Wellness (AWV)  06/10/2023    There are no preventive care reminders to display for this patient.   Lab Results  Component Value Date   TSH 0.917 02/28/2023   Lab Results  Component Value Date   WBC 8.1 02/28/2023   HGB 14.8 02/28/2023   HCT 45.3 02/28/2023   MCV 90 02/28/2023   PLT 344 02/28/2023   Lab Results  Component Value Date   NA 141 02/28/2023   K 4.1 02/28/2023   CO2 24 02/28/2023   GLUCOSE 104 (H)  02/28/2023   BUN 13 02/28/2023   CREATININE 1.17 (H) 02/28/2023   BILITOT 0.8 02/28/2023   ALKPHOS 89 02/28/2023   AST 25 02/28/2023   ALT 16 02/28/2023   PROT 6.9 02/28/2023   ALBUMIN 4.2 02/28/2023   CALCIUM 9.7 02/28/2023   EGFR 52 (L) 02/28/2023   Lab Results  Component Value Date   CHOL 165 02/28/2023   Lab Results  Component Value Date   HDL 51 02/28/2023   Lab Results  Component Value Date   LDLCALC 85 02/28/2023   Lab Results  Component Value Date   TRIG 173 (H) 02/28/2023   Lab Results  Component Value Date   CHOLHDL 3.2 02/28/2023   Lab Results  Component Value Date   HGBA1C 7.0 (H) 02/28/2023       Assessment & Plan:  Acute bronchitis due to other specified organisms Assessment & Plan: Zpack prescribed.  Kenalog  shot given.  Prednisone  burst prescribed.   Orders: -     Triamcinolone  Acetonide -     Azithromycin ; 2 DAILY FOR FIRST DAY, THEN DECREASE TO ONE DAILY FOR 4 MORE DAYS.  Dispense: 6 tablet; Refill: 0 -     predniSONE ; Take 1 tablet (50 mg total) by mouth daily with breakfast.  Dispense: 5 tablet; Refill: 0     Meds ordered this encounter  Medications   triamcinolone  acetonide (KENALOG -40) injection 80 mg   azithromycin  (ZITHROMAX ) 250 MG tablet    Sig: 2 DAILY FOR FIRST DAY, THEN DECREASE TO ONE DAILY FOR 4 MORE DAYS.    Dispense:  6 tablet    Refill:  0   predniSONE  (DELTASONE ) 50 MG tablet    Sig: Take 1 tablet (50 mg total) by mouth daily with breakfast.    Dispense:  5 tablet    Refill:  0    No orders of the defined types were placed in this encounter.    Follow-up: Return if symptoms worsen or fail to improve.  An After Visit Summary was printed and given to the patient.  Mercy Stall, MD [facility] [phone]

## 2023-05-13 NOTE — Assessment & Plan Note (Signed)
 Zpack prescribed.  Kenalog  shot given.  Prednisone  burst prescribed.

## 2023-05-25 NOTE — Progress Notes (Unsigned)
 Subjective:  Patient ID: Andrea White, female    DOB: 02-Aug-1955  Age: 68 y.o. MRN: [id number]  No chief complaint on file.   HPI: Diabetes:  Complications: neuropathy. Glucose checking: some days Glucose logs: She does not check frequently. 101-129 Hypoglycemia: none Most recent A1C: 7.0 Current medications: Jardiance  10 mg once daily.  Foot checks: daily.   Hyperlipidemia: Current medications: Pravastatin  40 mg daily. Hard to cut in half.    Hypertension: Current medications: hydrochlorothiazide  12.5 mg once daily and Valsartan  40 mg daily.   GERD: on omeprazole  40 mg one oral twice daily.    Urge incontinence: oxybutynin  5 mg daily or not at all.     FM: on duloxetine  60 mg before bed.    Chronic Back Pain: Takes tramadol  three times a day. Using diclofenac  gel.    Allergic rhinitis: claritin .   Asthma: not currently taking any inhalers. Not needing.   Diet: not eating well.       02/28/2023    8:50 AM 11/26/2022   10:06 AM 06/05/2022   10:26 AM 06/06/2021    3:26 PM 04/03/2021    8:34 AM  Depression screen PHQ 2/9  Decreased Interest 1  0 0 0  Down, Depressed, Hopeless 2 0 0 0 0  PHQ - 2 Score 3 0 0 0 0  Altered sleeping 3 0 0    Tired, decreased energy 1 0 0    Change in appetite 2 1 0    Feeling bad or failure about yourself  0 0 0    Trouble concentrating 0 0 0    Moving slowly or fidgety/restless 1 0 0    Suicidal thoughts 0 0 0    PHQ-9 Score 10 1 0    Difficult doing work/chores Somewhat difficult Not difficult at all Not difficult at all          11/26/2022   10:06 AM  Fall Risk   Falls in the past year? 0  Number falls in past yr: 0  Injury with Fall? 0  Risk for fall due to : No Fall Risks  Follow up Falls evaluation completed    Patient Care Team: Mercy Stall, MD as PCP - General (Family Medicine) Marcene Serve, MD as Referring Physician (Orthopedic Surgery) Lizzie Riis, DPM as Consulting Physician (Podiatry) Ona Bidding,  MD as Consulting Physician (Obstetrics and Gynecology) Murtis Arthur, OD (Optometry) Cloteal Daniels, MD (Orthopedic Surgery) Roney, Jenna, OD as Referring Physician (Optometry)   Review of Systems  Current Outpatient Medications on File Prior to Visit  Medication Sig Dispense Refill   albuterol  (VENTOLIN  HFA) 108 (90 Base) MCG/ACT inhaler INHALE 2 PUFFS INTO THE LUNGS EVERY 6 (SIX) HOURS AS NEEDED FOR WHEEZING OR SHORTNESS OF BREATH. 1 each 3   ALPRAZolam  (XANAX ) 0.5 MG tablet TAKE 1/2 TO 1 TABLET EVERY 8 HOURS AS NEEDED 30 tablet 2   azithromycin  (ZITHROMAX ) 250 MG tablet 2 DAILY FOR FIRST DAY, THEN DECREASE TO ONE DAILY FOR 4 MORE DAYS. 6 tablet 0   Biotin 78295 MCG TABS Take 1 tablet by mouth daily.     diclofenac  Sodium (VOLTAREN ) 1 % GEL Apply 4 g topically 4 (four) times daily as needed (knees, foot, or hand pain). 1000 g 3   DULoxetine  (CYMBALTA ) 60 MG capsule TAKE 1 CAPSULE DAILY 90 capsule 1   Estradiol 10 MCG TABS vaginal tablet Insert 1 tablet into the vagina twice a week     hydrochlorothiazide  (MICROZIDE ) 12.5  MG capsule TAKE 1 CAPSULE DAILY 90 capsule 1   JARDIANCE  10 MG TABS tablet TAKE 1 TABLET DAILY BEFORE BREAKFAST 90 tablet 1   loratadine  (CLARITIN ) 10 MG tablet Take 1 tablet (10 mg total) by mouth daily. 30 tablet 11   meclizine  (ANTIVERT ) 25 MG tablet Take 1 tablet (25 mg total) by mouth 2 (two) times daily. 180 tablet 1   Multiple Vitamin (MULTIVITAMIN) tablet Take 1 tablet by mouth daily.     nystatin  (MYCOSTATIN ) 100000 UNIT/ML suspension Take 5 mLs (500,000 Units total) by mouth 4 (four) times daily as needed (thrush). Swish and spit. 120 mL 0   omeprazole  (PRILOSEC) 40 MG capsule TAKE 1 CAPSULE TWICE DAILY 180 capsule 1   ondansetron  (ZOFRAN -ODT) 4 MG disintegrating tablet Take 1 tablet (4 mg total) by mouth every 8 (eight) hours as needed for nausea or vomiting. 30 tablet 0   oxybutynin  (DITROPAN ) 5 MG tablet Take 1 tablet (5 mg total) by mouth 3 (three) times daily.  270 tablet 0   pravastatin  (PRAVACHOL ) 20 MG tablet TAKE 1 TABLET DAILY 90 tablet 1   predniSONE  (DELTASONE ) 50 MG tablet Take 1 tablet (50 mg total) by mouth daily with breakfast. 5 tablet 0   tiZANidine  (ZANAFLEX ) 2 MG tablet Take 1 tablet (2 mg total) by mouth at bedtime as needed for muscle spasms. 90 tablet 0   traMADol  (ULTRAM ) 50 MG tablet Take 1 tablet (50 mg total) by mouth 3 (three) times daily as needed. 90 tablet 1   TRUE METRIX BLOOD GLUCOSE TEST test strip USE AS DIRECTED TO CHECK FASTING BLOOD SUGAR. 300 strip 2   TRUEplus Lancets 33G MISC E11.69 use new lancet each time when checking FBS 100 each 2   valACYclovir  (VALTREX ) 1000 MG tablet Take 2 tablets (2,000 mg total) by mouth 2 (two) times daily as needed (x 1 day for each break out). 20 tablet 0   valsartan  (DIOVAN ) 40 MG tablet Take 1 tablet (40 mg total) by mouth daily. 90 tablet 1   No current facility-administered medications on file prior to visit.   Past Medical History:  Diagnosis Date   Allergy    Anxiety    Arthritis    Bradycardia, unspecified    Bruises easily    Calf pain    when walking   Fibromyalgia    Frequent headaches    GERD (gastroesophageal reflux disease)    H/O bladder problems    Hypertension    IBS (irritable bowel syndrome)    Localized edema    Mild persistent asthma, uncomplicated    Morbid (severe) obesity due to excess calories (HCC)    Muscle pain    Other hypotension    Ovarian cyst    Slow transit constipation    Solitary pulmonary nodule    Swelling    feet and legs   Type 2 diabetes mellitus with diabetic nephropathy (HCC)    Varicose veins of bilateral lower extremities with pain    Past Surgical History:  Procedure Laterality Date   BUNIONECTOMY     left foot 5th toe   CARDIAC CATHETERIZATION     carpel tunnel surgery     CESAREAN SECTION     CHOLECYSTECTOMY     GALLBLADDER SURGERY     TONSILLECTOMY     TUBAL LIGATION     VAGINAL HYSTERECTOMY  2003   LAVH/SR     Family History  Problem Relation Age of Onset   Heart disease Father  Hypertension Father    Stroke Father    Heart attack Father    Cerebrovascular Accident Father    Diabetes Sister    Kidney disease Sister    Colon cancer Maternal Aunt    Breast cancer Maternal Aunt    Cancer Maternal Aunt 68       breast   Migraines Daughter    Colon polyps Neg Hx    Esophageal cancer Neg Hx    Rectal cancer Neg Hx    Stomach cancer Neg Hx    Social History   Socioeconomic History   Marital status: Widowed    Spouse name: Addisyn Demetrius   Number of children: 1   Years of education: Not on file   Highest education level: 12th grade  Occupational History   Occupation: Disabled due to foot problems since 1993  Tobacco Use   Smoking status: Never   Smokeless tobacco: Never  Vaping Use   Vaping status: Never Used  Substance and Sexual Activity   Alcohol use: No   Drug use: No   Sexual activity: Yes    Birth control/protection: Surgical    Comment: hyst  Other Topics Concern   Not on file  Social History Narrative   Not on file   Social Drivers of Health   Financial Resource Strain: Patient Declined (02/27/2023)   Overall Financial Resource Strain (CARDIA)    Difficulty of Paying Living Expenses: Patient declined  Food Insecurity: Patient Declined (02/27/2023)   Hunger Vital Sign    Worried About Running Out of Food in the Last Year: Patient declined    Ran Out of Food in the Last Year: Patient declined  Transportation Needs: No Transportation Needs (02/27/2023)   PRAPARE - Administrator, Civil Service (Medical): No    Lack of Transportation (Non-Medical): No  Physical Activity: Sufficiently Active (02/27/2023)   Exercise Vital Sign    Days of Exercise per Week: 7 days    Minutes of Exercise per Session: 60 min  Stress: No Stress Concern Present (02/27/2023)   [employer] of Occupational Health - Occupational Stress Questionnaire    Feeling of Stress : Only  a little  Social Connections: Socially Isolated (02/27/2023)   Social Connection and Isolation Panel [NHANES]    Frequency of Communication with Friends and Family: More than three times a week    Frequency of Social Gatherings with Friends and Family: More than three times a week    Attends Religious Services: Never    Database administrator or Organizations: No    Attends Banker Meetings: Never    Marital Status: Widowed    Objective:  There were no vitals taken for this visit.     05/13/2023   11:27 AM 04/25/2023    9:31 AM 02/28/2023    8:15 AM  BP/Weight  Systolic BP 116 102 128  Diastolic BP 80 70 74  Wt. (Lbs) 187 185 193  BMI 34.2 kg/m2 33.84 kg/m2 35.3 kg/m2    Physical Exam  Diabetic Foot Exam - Simple   No data filed      Lab Results  Component Value Date   WBC 8.1 02/28/2023   HGB 14.8 02/28/2023   HCT 45.3 02/28/2023   PLT 344 02/28/2023   GLUCOSE 104 (H) 02/28/2023   CHOL 165 02/28/2023   TRIG 173 (H) 02/28/2023   HDL 51 02/28/2023   LDLCALC 85 02/28/2023   ALT 16 02/28/2023   AST 25 02/28/2023  NA 141 02/28/2023   K 4.1 02/28/2023   CL 100 02/28/2023   CREATININE 1.17 (H) 02/28/2023   BUN 13 02/28/2023   CO2 24 02/28/2023   TSH 0.917 02/28/2023   HGBA1C 7.0 (H) 02/28/2023   MICROALBUR 80 09/05/2020      Assessment & Plan:  Hypertensive heart and kidney disease with acute combined systolic and diastolic congestive heart failure and stage 3b chronic kidney disease (HCC)  Moderate persistent asthma without complication  Diabetic polyneuropathy associated with type 2 diabetes mellitus (HCC)  Primary fibromyalgia syndrome  Mixed hyperlipidemia     No orders of the defined types were placed in this encounter.   No orders of the defined types were placed in this encounter.    Follow-up: No follow-ups on file.   I,Marla I Leal-Borjas,acting as a scribe for Mercy Stall, MD.,have documented all relevant documentation on the  behalf of Mercy Stall, MD,as directed by  Mercy Stall, MD while in the presence of Mercy Stall, MD.   An After Visit Summary was printed and given to the patient.  Mercy Stall, MD [facility] [phone]

## 2023-05-26 ENCOUNTER — Encounter: Payer: Self-pay | Admitting: Family Medicine

## 2023-05-26 ENCOUNTER — Ambulatory Visit (INDEPENDENT_AMBULATORY_CARE_PROVIDER_SITE_OTHER): Payer: Medicare HMO | Admitting: Family Medicine

## 2023-05-26 VITALS — BP 118/70 | HR 75 | Temp 98.1°F | Resp 16 | Ht 62.0 in | Wt 190.0 lb

## 2023-05-26 DIAGNOSIS — E785 Hyperlipidemia, unspecified: Secondary | ICD-10-CM | POA: Diagnosis not present

## 2023-05-26 DIAGNOSIS — M797 Fibromyalgia: Secondary | ICD-10-CM | POA: Diagnosis not present

## 2023-05-26 DIAGNOSIS — N1831 Chronic kidney disease, stage 3a: Secondary | ICD-10-CM | POA: Diagnosis not present

## 2023-05-26 DIAGNOSIS — M1990 Unspecified osteoarthritis, unspecified site: Secondary | ICD-10-CM | POA: Diagnosis not present

## 2023-05-26 DIAGNOSIS — N1832 Chronic kidney disease, stage 3b: Secondary | ICD-10-CM | POA: Diagnosis not present

## 2023-05-26 DIAGNOSIS — Z7984 Long term (current) use of oral hypoglycemic drugs: Secondary | ICD-10-CM | POA: Diagnosis not present

## 2023-05-26 DIAGNOSIS — J4540 Moderate persistent asthma, uncomplicated: Secondary | ICD-10-CM | POA: Diagnosis not present

## 2023-05-26 DIAGNOSIS — Z008 Encounter for other general examination: Secondary | ICD-10-CM | POA: Diagnosis not present

## 2023-05-26 DIAGNOSIS — Z1231 Encounter for screening mammogram for malignant neoplasm of breast: Secondary | ICD-10-CM

## 2023-05-26 DIAGNOSIS — E1142 Type 2 diabetes mellitus with diabetic polyneuropathy: Secondary | ICD-10-CM | POA: Diagnosis not present

## 2023-05-26 DIAGNOSIS — Z6834 Body mass index (BMI) 34.0-34.9, adult: Secondary | ICD-10-CM

## 2023-05-26 DIAGNOSIS — E782 Mixed hyperlipidemia: Secondary | ICD-10-CM | POA: Diagnosis not present

## 2023-05-26 DIAGNOSIS — J301 Allergic rhinitis due to pollen: Secondary | ICD-10-CM

## 2023-05-26 DIAGNOSIS — F325 Major depressive disorder, single episode, in full remission: Secondary | ICD-10-CM | POA: Diagnosis not present

## 2023-05-26 DIAGNOSIS — I5041 Acute combined systolic (congestive) and diastolic (congestive) heart failure: Secondary | ICD-10-CM | POA: Diagnosis not present

## 2023-05-26 DIAGNOSIS — I130 Hypertensive heart and chronic kidney disease with heart failure and stage 1 through stage 4 chronic kidney disease, or unspecified chronic kidney disease: Secondary | ICD-10-CM | POA: Diagnosis not present

## 2023-05-26 DIAGNOSIS — K219 Gastro-esophageal reflux disease without esophagitis: Secondary | ICD-10-CM

## 2023-05-26 DIAGNOSIS — E1122 Type 2 diabetes mellitus with diabetic chronic kidney disease: Secondary | ICD-10-CM | POA: Diagnosis not present

## 2023-05-26 DIAGNOSIS — J4489 Other specified chronic obstructive pulmonary disease: Secondary | ICD-10-CM | POA: Diagnosis not present

## 2023-05-26 DIAGNOSIS — E11319 Type 2 diabetes mellitus with unspecified diabetic retinopathy without macular edema: Secondary | ICD-10-CM | POA: Diagnosis not present

## 2023-05-26 DIAGNOSIS — Z8249 Family history of ischemic heart disease and other diseases of the circulatory system: Secondary | ICD-10-CM | POA: Diagnosis not present

## 2023-05-26 DIAGNOSIS — Z833 Family history of diabetes mellitus: Secondary | ICD-10-CM | POA: Diagnosis not present

## 2023-05-26 MED ORDER — ESTRADIOL 10 MCG VA TABS: 10.0000 ug | ORAL_TABLET | VAGINAL | 1 refills | Status: DC

## 2023-05-26 MED ORDER — TIZANIDINE HCL 2 MG PO TABS: 2.0000 mg | ORAL_TABLET | Freq: Every evening | ORAL | 0 refills | Status: DC | PRN

## 2023-05-26 MED ORDER — VALSARTAN 40 MG PO TABS: 40.0000 mg | ORAL_TABLET | Freq: Every day | ORAL | 1 refills | Status: DC

## 2023-05-26 MED ORDER — ALPRAZOLAM 0.5 MG PO TABS: ORAL_TABLET | ORAL | 2 refills | Status: DC

## 2023-05-26 MED ORDER — TRIAMCINOLONE ACETONIDE 40 MG/ML IJ SUSP
80.0000 mg | Freq: Once | INTRAMUSCULAR | Status: AC
  Administered 2023-05-26: 80 mg via INTRAMUSCULAR

## 2023-05-26 MED ORDER — ONDANSETRON 4 MG PO TBDP: 4.0000 mg | ORAL_TABLET | Freq: Three times a day (TID) | ORAL | 0 refills | Status: AC | PRN

## 2023-05-26 MED ORDER — PRAVASTATIN SODIUM 20 MG PO TABS: 20.0000 mg | ORAL_TABLET | Freq: Every day | ORAL | 1 refills | Status: DC

## 2023-05-26 MED ORDER — NYSTATIN 100000 UNIT/ML MT SUSP: 5.0000 mL | Freq: Four times a day (QID) | OROMUCOSAL | 0 refills | Status: DC | PRN

## 2023-05-26 MED ORDER — DULOXETINE HCL 60 MG PO CPEP: 60.0000 mg | ORAL_CAPSULE | Freq: Every day | ORAL | 1 refills | Status: AC

## 2023-05-26 MED ORDER — OMEPRAZOLE 40 MG PO CPDR: 40.0000 mg | DELAYED_RELEASE_CAPSULE | Freq: Two times a day (BID) | ORAL | 1 refills | Status: DC

## 2023-05-26 MED ORDER — OXYBUTYNIN CHLORIDE 5 MG PO TABS: 5.0000 mg | ORAL_TABLET | Freq: Three times a day (TID) | ORAL | 0 refills | Status: DC

## 2023-05-26 NOTE — Patient Instructions (Signed)
 Recommend swimming for exercise.  Kenalog  shot given today for allergies.

## 2023-05-27 ENCOUNTER — Other Ambulatory Visit: Payer: Self-pay

## 2023-05-27 ENCOUNTER — Encounter: Payer: Self-pay | Admitting: Family Medicine

## 2023-05-27 DIAGNOSIS — Z1231 Encounter for screening mammogram for malignant neoplasm of breast: Secondary | ICD-10-CM | POA: Insufficient documentation

## 2023-05-27 DIAGNOSIS — Z6834 Body mass index (BMI) 34.0-34.9, adult: Secondary | ICD-10-CM | POA: Insufficient documentation

## 2023-05-27 LAB — COMPREHENSIVE METABOLIC PANEL WITH GFR
ALT: 18 IU/L (ref 0–32)
AST: 17 IU/L (ref 0–40)
Albumin: 4.2 g/dL (ref 3.9–4.9)
Alkaline Phosphatase: 77 IU/L (ref 44–121)
BUN/Creatinine Ratio: 20 (ref 12–28)
BUN: 21 mg/dL (ref 8–27)
Bilirubin Total: 1.1 mg/dL (ref 0.0–1.2)
CO2: 23 mmol/L (ref 20–29)
Calcium: 9.4 mg/dL (ref 8.7–10.3)
Chloride: 105 mmol/L (ref 96–106)
Creatinine, Ser: 1.07 mg/dL — ABNORMAL HIGH (ref 0.57–1.00)
Globulin, Total: 2.2 g/dL (ref 1.5–4.5)
Glucose: 65 mg/dL — ABNORMAL LOW (ref 70–99)
Potassium: 4.3 mmol/L (ref 3.5–5.2)
Sodium: 144 mmol/L (ref 134–144)
Total Protein: 6.4 g/dL (ref 6.0–8.5)
eGFR: 58 mL/min/{1.73_m2} — ABNORMAL LOW (ref >59)

## 2023-05-27 LAB — CBC WITH DIFFERENTIAL/PLATELET
Basophils Absolute: 0 10*3/uL (ref 0.0–0.2)
Basos: 0 %
EOS (ABSOLUTE): 0.1 10*3/uL (ref 0.0–0.4)
Eos: 1 %
Hematocrit: 46.2 % (ref 34.0–46.6)
Hemoglobin: 14.9 g/dL (ref 11.1–15.9)
Immature Grans (Abs): 0.1 10*3/uL (ref 0.0–0.1)
Immature Granulocytes: 1 %
Lymphocytes Absolute: 2.8 10*3/uL (ref 0.7–3.1)
Lymphs: 29 %
MCH: 29.6 pg (ref 26.6–33.0)
MCHC: 32.3 g/dL (ref 31.5–35.7)
MCV: 92 fL (ref 79–97)
Monocytes Absolute: 1 10*3/uL — ABNORMAL HIGH (ref 0.1–0.9)
Monocytes: 10 %
Neutrophils Absolute: 5.9 10*3/uL (ref 1.4–7.0)
Neutrophils: 59 %
Platelets: 353 10*3/uL (ref 150–450)
RBC: 5.03 x10E6/uL (ref 3.77–5.28)
RDW: 15.8 % — ABNORMAL HIGH (ref 11.7–15.4)
WBC: 10 10*3/uL (ref 3.4–10.8)

## 2023-05-27 LAB — LIPID PANEL
Chol/HDL Ratio: 2.9 ratio (ref 0.0–4.4)
Cholesterol, Total: 198 mg/dL (ref 100–199)
HDL: 68 mg/dL (ref >39)
LDL Chol Calc (NIH): 110 mg/dL — ABNORMAL HIGH (ref 0–99)
Triglycerides: 115 mg/dL (ref 0–149)
VLDL Cholesterol Cal: 20 mg/dL (ref 5–40)

## 2023-05-27 LAB — HEMOGLOBIN A1C
Est. average glucose Bld gHb Est-mCnc: 154 mg/dL
Hgb A1c MFr Bld: 7 % — ABNORMAL HIGH (ref 4.8–5.6)

## 2023-05-27 MED ORDER — PRAVASTATIN SODIUM 40 MG PO TABS: 40.0000 mg | ORAL_TABLET | Freq: Every day | ORAL | 1 refills | Status: DC

## 2023-05-27 NOTE — Assessment & Plan Note (Signed)
Control: Controlled Recommend check sugars fasting daily. Recommend check feet daily. Recommend annual eye exams. Medicines: Jardiance 10 mg once daily.  Continue to work on eating a healthy diet and exercise.  Labs drawn today.

## 2023-05-27 NOTE — Assessment & Plan Note (Signed)
 Given kenalog  80 mg IM.

## 2023-05-27 NOTE — Assessment & Plan Note (Signed)
 FAIRLY WELL CONTROLLED.  Continue duloxetine  60 mg before bed.

## 2023-05-27 NOTE — Assessment & Plan Note (Signed)
The current medical regimen is effective;  continue present plan and medications. omeprazole 40 mg one oral twice daily.  

## 2023-05-27 NOTE — Assessment & Plan Note (Signed)
 The current medical regimen is effective;  continue present plan and medications. Continue pravastatin 20 mg daily. Continue to work on eating a healthy diet and exercise.  Labs drawn today.

## 2023-05-27 NOTE — Assessment & Plan Note (Signed)
 Well controlled.  No changes to medicines. hydrochlorothiazide 12.5 mg once daily and Valsartan 40 mg daily. Continue to work on eating a healthy diet and exercise.  Labs drawn today.

## 2023-05-27 NOTE — Assessment & Plan Note (Signed)
Continue claritin. °

## 2023-05-29 ENCOUNTER — Other Ambulatory Visit: Payer: Self-pay | Admitting: Family Medicine

## 2023-05-29 ENCOUNTER — Telehealth: Payer: Self-pay | Admitting: Internal Medicine

## 2023-05-29 DIAGNOSIS — E1159 Type 2 diabetes mellitus with other circulatory complications: Secondary | ICD-10-CM

## 2023-05-29 NOTE — Telephone Encounter (Signed)
 PT is requesting a 90 day prescription for Linzess be sent to [facility]

## 2023-06-04 ENCOUNTER — Ambulatory Visit: Admitting: Podiatry

## 2023-06-04 DIAGNOSIS — E1142 Type 2 diabetes mellitus with diabetic polyneuropathy: Secondary | ICD-10-CM

## 2023-06-04 NOTE — Progress Notes (Signed)
 Patient had stopped in today requesting a prescription for [facility] for diabetic shoes and inserts.  She was last seen on 03/07/2023.  She had no other concerns other than to request the shoes.  Upon arrival, after hearing this was her main concern today, she was told that she did not need to be seen since we have current records indicating her foot conditions.  Will go ahead and fax an order for the diabetic shoes with diabetic insoles to [facility] since our office is not currently participating in the diabetic shoe program due to staffing issues.  Patient can follow-up as scheduled for her diabetic footcare exams.

## 2023-06-19 ENCOUNTER — Ambulatory Visit: Payer: Self-pay | Admitting: Family Medicine

## 2023-06-19 ENCOUNTER — Ambulatory Visit

## 2023-06-24 ENCOUNTER — Other Ambulatory Visit: Payer: Self-pay | Admitting: Family Medicine

## 2023-06-24 NOTE — Telephone Encounter (Unsigned)
 Copied from CRM [id number]. Topic: Clinical - Medication Refill >> Jun 24, 2023  9:45 AM Marissa P wrote: Medication: meclizine  (ANTIVERT ) 25 MG tablet  Has the patient contacted their pharmacy? No (Agent: If no, request that the patient contact the pharmacy for the refill. If patient does not wish to contact the pharmacy document the reason why and proceed with request.) (Agent: If yes, when and what did the pharmacy advise?)  This is the patient's preferred pharmacy:  [facility] [address] - [place], [place] - [address] [place] [address] Phone: [phone] Fax: [phone]   Is this the correct pharmacy for this prescription? Yes If no, delete pharmacy and type the correct one.   Has the prescription been filled recently? No  Is the patient out of the medication? Yes  Has the patient been seen for an appointment in the last year OR does the patient have an upcoming appointment? Yes  Can we respond through MyChart? Yes  Agent: Please be advised that Rx refills may take up to 3 business days. We ask that you follow-up with your pharmacy.

## 2023-06-25 MED ORDER — MECLIZINE HCL 25 MG PO TABS: 25.0000 mg | ORAL_TABLET | Freq: Two times a day (BID) | ORAL | 1 refills | Status: DC

## 2023-06-30 ENCOUNTER — Encounter: Payer: Self-pay | Admitting: Gastroenterology

## 2023-06-30 ENCOUNTER — Ambulatory Visit: Admitting: Gastroenterology

## 2023-06-30 VITALS — BP 118/80 | HR 81 | Ht 62.0 in | Wt 191.0 lb

## 2023-06-30 DIAGNOSIS — K581 Irritable bowel syndrome with constipation: Secondary | ICD-10-CM | POA: Diagnosis not present

## 2023-06-30 DIAGNOSIS — Z860101 Personal history of adenomatous and serrated colon polyps: Secondary | ICD-10-CM

## 2023-06-30 DIAGNOSIS — Z860100 Personal history of colon polyps, unspecified: Secondary | ICD-10-CM

## 2023-06-30 MED ORDER — LINACLOTIDE 145 MCG PO CAPS: 145.0000 ug | ORAL_CAPSULE | Freq: Every day | ORAL | 0 refills | Status: DC

## 2023-06-30 NOTE — Progress Notes (Addendum)
 Chief Complaint: Chronic Constipation Primary GI MD: Dr.Dorsey  HPI: Discussed the use of AI scribe software for clinical note transcription with the patient, who gave verbal consent to proceed.  History of Present Illness Andrea White is a 68 year old female who presents with chronic constipation.  She has a long-standing history of constipation, with bowel movements occurring every week to two weeks despite the use of laxatives. Recently, her bowel movements have increased to every other day after she increased her intake of salads. She has tried MiraLAX twice daily without success and has had to double up on laxatives to achieve relief.  She experiences straining during bowel movements, although this has decreased with the recent increase in frequency. No blood in her stool. She reports a weight loss of nine pounds, which she attributes to dietary changes, but she has since regained the weight.  She experiences occasional left-sided abdominal pain. She also reports having back problems and nerve damage, which she believes may contribute to her constipation. No nausea or vomiting.    PREVIOUS GI WORKUP   Colonoscopy 12/2022 - 11 3 to 10 mm polyps in the ascending colon and in the cecum, removed with a cold snare. Resected and retrieved.  - Diverticulosis in the sigmoid colon.  - Non- bleeding internal hemorrhoids. - repeat 3 years (12/2025)  . Surgical [P], colon, ascending and cecum, polyp (11) :       TUBULAR ADENOMA (8) WITHOUT HIGH GRADE DYSPLASIA.       SESSILE SERRATED POLYP (3) WITHOUT CYTOLOGIC DYSPLASIA.   Past Medical History:  Diagnosis Date   Allergy    Anxiety    Arthritis    Bradycardia, unspecified    Bruises easily    Calf pain    when walking   Fibromyalgia    Frequent headaches    GERD (gastroesophageal reflux disease)    H/O bladder problems    Hypertension    IBS (irritable bowel syndrome)    Localized edema    Mild persistent asthma,  uncomplicated    Morbid (severe) obesity due to excess calories (HCC)    Muscle pain    Other hypotension    Ovarian cyst    Slow transit constipation    Solitary pulmonary nodule    Swelling    feet and legs   Type 2 diabetes mellitus with diabetic nephropathy (HCC)    Varicose veins of bilateral lower extremities with pain     Past Surgical History:  Procedure Laterality Date   BUNIONECTOMY     left foot 5th toe   CARDIAC CATHETERIZATION     carpel tunnel surgery     CESAREAN SECTION     CHOLECYSTECTOMY     GALLBLADDER SURGERY     TONSILLECTOMY     TUBAL LIGATION     VAGINAL HYSTERECTOMY  2003   LAVH/SR    Current Outpatient Medications  Medication Sig Dispense Refill   ALPRAZolam  (XANAX ) 0.5 MG tablet TAKE 1/2 TO 1 TABLET EVERY 8 HOURS AS NEEDED 30 tablet 2   Biotin 16109 MCG TABS Take 1 tablet by mouth daily.     diclofenac  Sodium (VOLTAREN ) 1 % GEL Apply 4 g topically 4 (four) times daily as needed (knees, foot, or hand pain). 1000 g 3   DULoxetine  (CYMBALTA ) 60 MG capsule Take 1 capsule (60 mg total) by mouth daily. 90 capsule 1   Estradiol  10 MCG TABS vaginal tablet Place 1 tablet (10 mcg total) vaginally 2 (two) times  a week. 8 tablet 1   hydrochlorothiazide  (MICROZIDE ) 12.5 MG capsule TAKE 1 CAPSULE DAILY 90 capsule 1   JARDIANCE  10 MG TABS tablet TAKE 1 TABLET DAILY BEFORE BREAKFAST 90 tablet 1   loratadine  (CLARITIN ) 10 MG tablet Take 1 tablet (10 mg total) by mouth daily. 30 tablet 11   meclizine  (ANTIVERT ) 25 MG tablet Take 1 tablet (25 mg total) by mouth 2 (two) times daily. 180 tablet 1   Multiple Vitamin (MULTIVITAMIN) tablet Take 1 tablet by mouth daily.     nystatin  (MYCOSTATIN ) 100000 UNIT/ML suspension Take 5 mLs (500,000 Units total) by mouth 4 (four) times daily as needed (thrush). Swish and spit. 473 mL 0   omeprazole  (PRILOSEC) 40 MG capsule Take 1 capsule (40 mg total) by mouth 2 (two) times daily. 180 capsule 1   ondansetron  (ZOFRAN -ODT) 4 MG  disintegrating tablet Take 1 tablet (4 mg total) by mouth every 8 (eight) hours as needed for nausea or vomiting. 30 tablet 0   oxybutynin  (DITROPAN ) 5 MG tablet Take 1 tablet (5 mg total) by mouth 3 (three) times daily. 270 tablet 0   pravastatin  (PRAVACHOL ) 40 MG tablet Take 1 tablet (40 mg total) by mouth daily. 90 tablet 1   tiZANidine  (ZANAFLEX ) 2 MG tablet Take 1 tablet (2 mg total) by mouth at bedtime as needed for muscle spasms. 90 tablet 0   traMADol  (ULTRAM ) 50 MG tablet Take 1 tablet (50 mg total) by mouth 3 (three) times daily as needed. 90 tablet 1   TRUE METRIX BLOOD GLUCOSE TEST test strip USE AS DIRECTED TO CHECK FASTING BLOOD SUGAR. 300 strip 2   TRUEplus Lancets 33G MISC E11.69 use new lancet each time when checking FBS 100 each 2   valACYclovir  (VALTREX ) 1000 MG tablet Take 2 tablets (2,000 mg total) by mouth 2 (two) times daily as needed (x 1 day for each break out). 20 tablet 0   valsartan  (DIOVAN ) 40 MG tablet Take 1 tablet (40 mg total) by mouth daily. 90 tablet 1   linaclotide (LINZESS) 145 MCG CAPS capsule Take 1 capsule (145 mcg total) by mouth daily before breakfast. 90 capsule 0   No current facility-administered medications for this visit.    Allergies as of 06/30/2023 - Review Complete 06/30/2023  Allergen Reaction Noted   Gabapentin Other (See Comments) 04/29/2019   Morphine and codeine Other (See Comments) 03/31/2013   Celebrex [celecoxib] Other (See Comments) 06/18/2011   Lisinopril Cough 03/15/2015   Diazepam  Other (See Comments) 03/31/2013   Fenofibrate   05/15/2021   Guaifenesin & derivatives Other (See Comments) 09/26/2017   Lipitor [atorvastatin] Rash 04/29/2019   Sulfa antibiotics Rash 06/10/2011   Tape Other (See Comments) 01/17/2023    Family History  Problem Relation Age of Onset   Heart disease Father    Hypertension Father    Stroke Father    Heart attack Father    Cerebrovascular Accident Father    Diabetes Sister    Kidney disease  Sister    Colon cancer Maternal Aunt    Breast cancer Maternal Aunt    Cancer Maternal Aunt 31       breast   Migraines Daughter    Colon polyps Neg Hx    Esophageal cancer Neg Hx    Rectal cancer Neg Hx    Stomach cancer Neg Hx     Social History   Socioeconomic History   Marital status: Widowed    Spouse name: Renessa Wellnitz   Number of children:  1   Years of education: Not on file   Highest education level: 12th grade  Occupational History   Occupation: Disabled due to foot problems since 1993  Tobacco Use   Smoking status: Never   Smokeless tobacco: Never  Vaping Use   Vaping status: Never Used  Substance and Sexual Activity   Alcohol use: No   Drug use: No   Sexual activity: Not Currently    Partners: Male    Birth control/protection: Surgical    Comment: hyst  Other Topics Concern   Not on file  Social History Narrative   Not on file   Social Drivers of Health   Financial Resource Strain: Patient Declined (02/27/2023)   Overall Financial Resource Strain (CARDIA)    Difficulty of Paying Living Expenses: Patient declined  Food Insecurity: Patient Declined (02/27/2023)   Hunger Vital Sign    Worried About Running Out of Food in the Last Year: Patient declined    Ran Out of Food in the Last Year: Patient declined  Transportation Needs: No Transportation Needs (02/27/2023)   PRAPARE - Administrator, Civil Service (Medical): No    Lack of Transportation (Non-Medical): No  Physical Activity: Sufficiently Active (02/27/2023)   Exercise Vital Sign    Days of Exercise per Week: 7 days    Minutes of Exercise per Session: 60 min  Stress: No Stress Concern Present (02/27/2023)   [employer] of Occupational Health - Occupational Stress Questionnaire    Feeling of Stress : Only a little  Social Connections: Socially Isolated (02/27/2023)   Social Connection and Isolation Panel [NHANES]    Frequency of Communication with Friends and Family: More than three  times a week    Frequency of Social Gatherings with Friends and Family: More than three times a week    Attends Religious Services: Never    Database administrator or Organizations: No    Attends Banker Meetings: Not on file    Marital Status: Widowed  Intimate Partner Violence: Not At Risk (11/26/2022)   Humiliation, Afraid, Rape, and Kick questionnaire    Fear of Current or Ex-Partner: No    Emotionally Abused: No    Physically Abused: No    Sexually Abused: No    Review of Systems:    Constitutional: No weight loss, fever, chills, weakness or fatigue HEENT: Eyes: No change in vision               Ears, Nose, Throat:  No change in hearing or congestion Skin: No rash or itching Cardiovascular: No chest pain, chest pressure or palpitations   Respiratory: No SOB or cough Gastrointestinal: See HPI and otherwise negative Genitourinary: No dysuria or change in urinary frequency Neurological: No headache, dizziness or syncope Musculoskeletal: No new muscle or joint pain Hematologic: No bleeding or bruising Psychiatric: No history of depression or anxiety    Physical Exam:  Vital signs: BP 118/80   Pulse 81   Ht 5\' 2"  (1.575 m)   Wt 191 lb (86.6 kg)   SpO2 95%   BMI 34.93 kg/m   Constitutional: NAD, alert and cooperative Head:  Normocephalic and atraumatic. Eyes:   PEERL, EOMI. No icterus. Conjunctiva pink. Respiratory: Respirations even and unlabored. Lungs clear to auscultation bilaterally.   No wheezes, crackles, or rhonchi.  Cardiovascular:  Regular rate and rhythm. No peripheral edema, cyanosis or pallor.  Gastrointestinal:  Soft, nondistended, nontender. No rebound or guarding. Hypoactive bowel sounds. No appreciable masses or  hepatomegaly. Rectal:  Declines Msk:  Symmetrical without gross deformities. Without edema, no deformity or joint abnormality.  Neurologic:  Alert and  oriented x4;  grossly normal neurologically.  Skin:   Dry and intact without  significant lesions or rashes. Psychiatric: Oriented to person, place and time. Demonstrates good judgement and reason without abnormal affect or behaviors.   RELEVANT LABS AND IMAGING: CBC    Component Value Date/Time   WBC 10.0 05/26/2023 1132   WBC 18.5 (H) 07/23/2005 1249   RBC 5.03 05/26/2023 1132   RBC 4.6 09/12/2020 0000   HGB 14.9 05/26/2023 1132   HGB 14.6 07/23/2005 1249   HCT 46.2 05/26/2023 1132   HCT 42.5 07/23/2005 1249   PLT 353 05/26/2023 1132   MCV 92 05/26/2023 1132   MCV 92.5 07/23/2005 1249   MCH 29.6 05/26/2023 1132   MCH 31.9 07/23/2005 1249   MCHC 32.3 05/26/2023 1132   MCHC 34.5 07/23/2005 1249   RDW 15.8 (H) 05/26/2023 1132   RDW 13.4 07/23/2005 1249   LYMPHSABS 2.8 05/26/2023 1132   LYMPHSABS 1.3 07/23/2005 1249   MONOABS 0.5 07/23/2005 1249   EOSABS 0.1 05/26/2023 1132   BASOSABS 0.0 05/26/2023 1132   BASOSABS 0.0 07/23/2005 1249    CMP     Component Value Date/Time   NA 144 05/26/2023 1132   K 4.3 05/26/2023 1132   CL 105 05/26/2023 1132   CO2 23 05/26/2023 1132   GLUCOSE 65 (L) 05/26/2023 1132   GLUCOSE 91 12/26/2011 1349   BUN 21 05/26/2023 1132   CREATININE 1.07 (H) 05/26/2023 1132   CALCIUM 9.4 05/26/2023 1132   PROT 6.4 05/26/2023 1132   ALBUMIN 4.2 05/26/2023 1132   AST 17 05/26/2023 1132   ALT 18 05/26/2023 1132   ALKPHOS 77 05/26/2023 1132   BILITOT 1.1 05/26/2023 1132   GFRNONAA 73 10/29/2019 0850   GFRAA 84 10/29/2019 0850     Assessment/Plan:   IBS-C Longstanding history of chronic constipation in which she has failed fiber supplement and MiraLAX.  Colonoscopy 2024 with 8 tubular adenomas and 3 SSAs and a recall of 3 years.  Associated LLQ pain which improves with bowel movement.  Occasional straining and normal TSH. - Trial of Linzess 145 mcg - Increase water, increase fiber, increase exercise - Consider pelvic floor physical therapy for pelvic floor dyssynergia - Follow-up with me in October - Please call sooner  if any questions  Colon cancer screening -Due for repeat 2027  Suzanna Erp, PA-C [facility] 06/30/2023, 9:54 AM  Cc: Mercy Stall, MD

## 2023-06-30 NOTE — Patient Instructions (Signed)
 We have sent the following prescriptions to your mail in pharmacy: Linzess 145mcg  If you have not heard from your mail in pharmacy within 1 week or if you have not received your medication in the mail, please contact us  at [phone] so we may find out why.   Linzess works best when taken once a day every day, on an empty stomach, at least 30 minutes before your first meal of the day.  When Linzess is taken daily as directed:  *Constipation relief is typically felt in about a week *IBS-C patients may begin to experience relief from belly pain and overall abdominal symptoms (pain, discomfort, and bloating) in about 1 week,   with symptoms typically improving over 12 weeks.  Diarrhea may occur in the first 2 weeks -keep taking it.  The diarrhea should go away and you should start having normal, complete, full bowel movements. It may be helpful to start treatment when you can be near the comfort of your own bathroom, such as a weekend.   We are providing you with samples to try first.  Call in early August to make an October follow up appointment with Suzanna Erp, PA.  I appreciate the opportunity to care for you. Suzanna Erp, PA

## 2023-06-30 NOTE — Progress Notes (Signed)
 I agree with the assessment and plan as outlined by Ms. McMichael. Patient on her last colonoscopy had 8 TAs and 3 SSAs.

## 2023-07-08 ENCOUNTER — Other Ambulatory Visit: Payer: Self-pay | Admitting: Family Medicine

## 2023-07-08 ENCOUNTER — Telehealth: Payer: Self-pay | Admitting: Family Medicine

## 2023-07-08 DIAGNOSIS — I5041 Acute combined systolic (congestive) and diastolic (congestive) heart failure: Secondary | ICD-10-CM

## 2023-07-08 NOTE — Telephone Encounter (Signed)
 Andrea White Probation officer

## 2023-07-09 ENCOUNTER — Encounter

## 2023-08-27 ENCOUNTER — Other Ambulatory Visit: Payer: Self-pay | Admitting: Physician Assistant

## 2023-08-27 ENCOUNTER — Encounter: Payer: Self-pay | Admitting: Physician Assistant

## 2023-08-27 ENCOUNTER — Ambulatory Visit: Payer: Self-pay | Admitting: Physician Assistant

## 2023-08-27 ENCOUNTER — Ambulatory Visit (INDEPENDENT_AMBULATORY_CARE_PROVIDER_SITE_OTHER): Admitting: Physician Assistant

## 2023-08-27 ENCOUNTER — Ambulatory Visit: Payer: Self-pay

## 2023-08-27 VITALS — BP 108/82 | HR 79 | Temp 98.0°F | Ht 62.0 in | Wt 195.8 lb

## 2023-08-27 DIAGNOSIS — X58XXXA Exposure to other specified factors, initial encounter: Secondary | ICD-10-CM | POA: Diagnosis not present

## 2023-08-27 DIAGNOSIS — M79601 Pain in right arm: Secondary | ICD-10-CM

## 2023-08-27 DIAGNOSIS — S52514A Nondisplaced fracture of right radial styloid process, initial encounter for closed fracture: Secondary | ICD-10-CM

## 2023-08-27 NOTE — Progress Notes (Signed)
 Acute Office Visit  Subjective:    Patient ID: Andrea White, female    DOB: [birth year], 68 y.o.   MRN: [id number]  Chief Complaint  Patient presents with   Wrist Injury    HPI: Patient is in today for complaints of right arm, elbow and wrist pain She states that yesterday she slid on a ramp outside and fell onto right arm and it landed in a crack between the wood Did not fall on outstretched arm She is having pain and swelling from her right elbow to right wrist She has decreased rom in wrist and arm secondary to pain Normal rom of shoulder and minimal pain with elbow rom   Current Outpatient Medications:    ALPRAZolam  (XANAX ) 0.5 MG tablet, TAKE 1/2 TO 1 TABLET EVERY 8 HOURS AS NEEDED, Disp: 30 tablet, Rfl: 2   Biotin 10000 MCG TABS, Take 1 tablet by mouth daily., Disp: , Rfl:    diclofenac  Sodium (VOLTAREN ) 1 % GEL, Apply 4 g topically 4 (four) times daily as needed (knees, foot, or hand pain)., Disp: 1000 g, Rfl: 3   DULoxetine  (CYMBALTA ) 60 MG capsule, Take 1 capsule (60 mg total) by mouth daily., Disp: 90 capsule, Rfl: 1   Estradiol  10 MCG TABS vaginal tablet, Place 1 tablet (10 mcg total) vaginally 2 (two) times a week., Disp: 8 tablet, Rfl: 1   hydrochlorothiazide  (MICROZIDE ) 12.5 MG capsule, TAKE 1 CAPSULE DAILY, Disp: 90 capsule, Rfl: 0   JARDIANCE  10 MG TABS tablet, TAKE 1 TABLET DAILY BEFORE BREAKFAST, Disp: 90 tablet, Rfl: 1   linaclotide  (LINZESS ) 145 MCG CAPS capsule, Take 1 capsule (145 mcg total) by mouth daily before breakfast., Disp: 90 capsule, Rfl: 0   loratadine  (CLARITIN ) 10 MG tablet, Take 1 tablet (10 mg total) by mouth daily., Disp: 30 tablet, Rfl: 11   meclizine  (ANTIVERT ) 25 MG tablet, Take 1 tablet (25 mg total) by mouth 2 (two) times daily., Disp: 180 tablet, Rfl: 1   Multiple Vitamin (MULTIVITAMIN) tablet, Take 1 tablet by mouth daily., Disp: , Rfl:    nystatin  (MYCOSTATIN ) 100000 UNIT/ML suspension, Take 5 mLs (500,000 Units total) by mouth 4  (four) times daily as needed (thrush). Swish and spit., Disp: 473 mL, Rfl: 0   omeprazole  (PRILOSEC) 40 MG capsule, Take 1 capsule (40 mg total) by mouth 2 (two) times daily., Disp: 180 capsule, Rfl: 1   ondansetron  (ZOFRAN -ODT) 4 MG disintegrating tablet, Take 1 tablet (4 mg total) by mouth every 8 (eight) hours as needed for nausea or vomiting., Disp: 30 tablet, Rfl: 0   oxybutynin  (DITROPAN ) 5 MG tablet, Take 1 tablet (5 mg total) by mouth 3 (three) times daily., Disp: 270 tablet, Rfl: 0   tiZANidine  (ZANAFLEX ) 2 MG tablet, Take 1 tablet (2 mg total) by mouth at bedtime as needed for muscle spasms., Disp: 90 tablet, Rfl: 0   traMADol  (ULTRAM ) 50 MG tablet, Take 1 tablet (50 mg total) by mouth 3 (three) times daily as needed., Disp: 90 tablet, Rfl: 1   valACYclovir  (VALTREX ) 1000 MG tablet, Take 2 tablets (2,000 mg total) by mouth 2 (two) times daily as needed (x 1 day for each break out)., Disp: 20 tablet, Rfl: 0   valsartan  (DIOVAN ) 40 MG tablet, Take 1 tablet (40 mg total) by mouth daily., Disp: 90 tablet, Rfl: 1   pravastatin  (PRAVACHOL ) 40 MG tablet, Take 1 tablet (40 mg total) by mouth daily. (Patient not taking: Reported on 08/27/2023), Disp: 90 tablet, Rfl: 1  TRUE METRIX BLOOD GLUCOSE TEST test strip, USE AS DIRECTED TO CHECK FASTING BLOOD SUGAR. (Patient not taking: Reported on 08/27/2023), Disp: 300 strip, Rfl: 2   TRUEplus Lancets 33G MISC, E11.69 use new lancet each time when checking FBS (Patient not taking: Reported on 08/27/2023), Disp: 100 each, Rfl: 2  Allergies  Allergen Reactions   Gabapentin Other (See Comments)    Confusion   Morphine And Codeine Other (See Comments)    Woozy, out of body experience   Celebrex [Celecoxib] Other (See Comments)    Patient does not remember intolerance   Lisinopril Cough   Diazepam  Other (See Comments)    Lightheaded, dizziness   Fenofibrate      Vertigo    Guaifenesin & Derivatives Other (See Comments)    Patient cannot remember specific  issue, just made her feel weird   Lipitor [Atorvastatin] Rash   Sulfa Antibiotics Rash   Tape Other (See Comments)    Need to use paper tape - tape will tear skin    ROS CONSTITUTIONAL: Negative for chills, fatigue, fever  CARDIOVASCULAR: Negative for chest pain,  RESPIRATORY: Negative for recent cough and dyspnea.   MSK: see HPI INTEGUMENTARY: Negative for rash. - no lacerations      Objective:    PHYSICAL EXAM:   BP 108/82   Pulse 79   Temp 98 F (36.7 C)   Ht 5' 2 (1.575 m)   Wt 195 lb 12.8 oz (88.8 kg)   SpO2 95%   BMI 35.81 kg/m    GEN: Well nourished, well developed, in no acute distress   Cardiac: RRR; no murmurs,  Respiratory:  normal respiratory rate and pattern with no distress - normal breath sounds with no rales, rhonchi, wheezes or rubs MS: right elbow, forearm and wrist tender to palpation with swelling noted - decreased rom  Skin: warm and dry, no rash       Assessment & Plan:    Right arm pain -     DG Elbow 2 Views Right; Future -     DG Wrist Complete Right; Future -     DG Forearm Right; Future   Will follow up according to xray results  Follow-up: No follow-ups on file.  An After Visit Summary was printed and given to the patient.  Andrea White [facility] [phone]

## 2023-08-27 NOTE — Addendum Note (Signed)
 Addended by: NICHOLAUS CREDIT on: 08/27/2023 02:46 PM   Modules accepted: Level of Service

## 2023-08-27 NOTE — Telephone Encounter (Signed)
 FYI Only or Action Required?: FYI only for provider.  Patient was last seen in primary care on 05/26/2023 by Sherre Clapper, MD.  Called Nurse Triage reporting Fall.  Symptoms began yesterday.  Interventions attempted: OTC medications: tylenol  and Prescription medications: tramadol .  Symptoms are: gradually worsening.  Triage Disposition: See Physician Within 24 Hours  Patient/caregiver understands and will follow disposition?: Yes   Copied from CRM [id number]. Topic: Clinical - Red Word Triage >> Aug 27, 2023 10:02 AM Pinkey ORN wrote: Red Word that prompted transfer to Nurse Triage: Fall >> Aug 27, 2023 10:03 AM Pinkey ORN wrote: Patient states she experienced a fall on yesterday and her right arm is hurting pretty bad. Patient states she doesn't know if she injured her wrist or what, but she's unable to use her arm.  Reason for Disposition  [1] No prior tetanus shots (or is not fully vaccinated) AND [2] any wound (e.g., cut or scrape)  Answer Assessment - Initial Assessment Questions 1. MECHANISM: How did the fall happen?     Raining & slick ramp therefore slipped and fell 2. DOMESTIC VIOLENCE AND ELDER ABUSE SCREENING: Did you fall because someone pushed you or tried to hurt you? If Yes, ask: Are you safe now?     no 3. ONSET: When did the fall happen? (e.g., minutes, hours, or days ago)     yesterday 4. LOCATION: What part of the body hit the ground? (e.g., back, buttocks, head, hips, knees, hands, head, stomach)     Knees & right arm 5. INJURY: Did you hurt (injure) yourself when you fell? If Yes, ask: What did you injure? Tell me more about this? (e.g., body area; type of injury; pain severity)     no 6. PAIN: Is there any pain? If Yes, ask: How bad is the pain? (e.g., Scale 0-10; or none, mild,      4/10: tylenol  and tramadol - right arm 7. SIZE: For cuts, bruises, or swelling, ask: How large is it? (e.g., inches or centimeters)      na 8. PREGNANCY:  Is there any chance you are pregnant? When was your last menstrual period?     na 9. OTHER SYMPTOMS: Do you have any other symptoms? (e.g., dizziness, fever, weakness; new-onset or worsening).      N/a 10. CAUSE: What do you think caused the fall (or falling)? (e.g., dizzy spell, tripped)       tripped  Can barely move fingers and thumb, pain on top of wrist and goes almost to elbow. Right arm and hand warm to touch.  Protocols used: Falls and [facility]

## 2023-08-28 DIAGNOSIS — S59201A Unspecified physeal fracture of lower end of radius, right arm, initial encounter for closed fracture: Secondary | ICD-10-CM | POA: Diagnosis not present

## 2023-09-02 DIAGNOSIS — E1142 Type 2 diabetes mellitus with diabetic polyneuropathy: Secondary | ICD-10-CM | POA: Diagnosis not present

## 2023-09-02 DIAGNOSIS — M2141 Flat foot [pes planus] (acquired), right foot: Secondary | ICD-10-CM | POA: Diagnosis not present

## 2023-09-02 DIAGNOSIS — M2142 Flat foot [pes planus] (acquired), left foot: Secondary | ICD-10-CM | POA: Diagnosis not present

## 2023-09-22 DIAGNOSIS — Z79899 Other long term (current) drug therapy: Secondary | ICD-10-CM | POA: Diagnosis not present

## 2023-09-22 DIAGNOSIS — R42 Dizziness and giddiness: Secondary | ICD-10-CM | POA: Diagnosis not present

## 2023-09-22 DIAGNOSIS — R110 Nausea: Secondary | ICD-10-CM | POA: Diagnosis not present

## 2023-09-22 DIAGNOSIS — I10 Essential (primary) hypertension: Secondary | ICD-10-CM | POA: Diagnosis not present

## 2023-09-22 DIAGNOSIS — R519 Headache, unspecified: Secondary | ICD-10-CM | POA: Diagnosis not present

## 2023-10-01 ENCOUNTER — Encounter: Payer: Self-pay | Admitting: Family Medicine

## 2023-10-01 ENCOUNTER — Ambulatory Visit: Admitting: Family Medicine

## 2023-10-01 VITALS — BP 126/68 | HR 89 | Temp 98.3°F | Ht 62.0 in | Wt 182.0 lb

## 2023-10-01 DIAGNOSIS — H6090 Unspecified otitis externa, unspecified ear: Secondary | ICD-10-CM | POA: Insufficient documentation

## 2023-10-01 DIAGNOSIS — J208 Acute bronchitis due to other specified organisms: Secondary | ICD-10-CM | POA: Diagnosis not present

## 2023-10-01 DIAGNOSIS — R42 Dizziness and giddiness: Secondary | ICD-10-CM | POA: Diagnosis not present

## 2023-10-01 DIAGNOSIS — Z1231 Encounter for screening mammogram for malignant neoplasm of breast: Secondary | ICD-10-CM | POA: Diagnosis not present

## 2023-10-01 DIAGNOSIS — J019 Acute sinusitis, unspecified: Secondary | ICD-10-CM | POA: Insufficient documentation

## 2023-10-01 DIAGNOSIS — H60391 Other infective otitis externa, right ear: Secondary | ICD-10-CM

## 2023-10-01 DIAGNOSIS — J018 Other acute sinusitis: Secondary | ICD-10-CM

## 2023-10-01 LAB — POCT INFLUENZA A/B
Influenza A, POC: NEGATIVE
Influenza B, POC: NEGATIVE

## 2023-10-01 LAB — POC COVID19 BINAXNOW: SARS Coronavirus 2 Ag: POSITIVE — AB

## 2023-10-01 MED ORDER — PREDNISONE 50 MG PO TABS: 50.0000 mg | ORAL_TABLET | Freq: Every day | ORAL | 0 refills | Status: DC

## 2023-10-01 MED ORDER — PRAVASTATIN SODIUM 40 MG PO TABS: 40.0000 mg | ORAL_TABLET | Freq: Every day | ORAL | 1 refills | Status: DC

## 2023-10-01 MED ORDER — AZITHROMYCIN 250 MG PO TABS: ORAL_TABLET | ORAL | 0 refills | Status: DC

## 2023-10-01 MED ORDER — TRIAMCINOLONE ACETONIDE 40 MG/ML IJ SUSP
80.0000 mg | Freq: Once | INTRAMUSCULAR | Status: AC
  Administered 2023-10-01: 80 mg via INTRAMUSCULAR

## 2023-10-01 NOTE — Patient Instructions (Addendum)
  VISIT SUMMARY: Today, we addressed your vertigo, recent bronchitis, and potential COVID-19 exposure. We also discussed your wrist fracture and hyperlipidemia.  YOUR PLAN: BRONCHITIS: You have acute bronchitis with congestion and sinus issues, which is making your vertigo worse. -You are prescribed ZPak and prednisone  to help with your bronchitis. -Kenalog  shot 80 mg given.  VERTIGO: Your chronic vertigo has been worsened by your bronchitis. -Continue taking your current vertigo medication (meclizine ) three times a day as needed.  COVID-19 EXPOSURE: You may have been exposed to COVID-19 through family members who tested positive. -You will be tested for COVID-19.

## 2023-10-01 NOTE — Progress Notes (Signed)
 Subjective:  Patient ID: Andrea White, female    DOB: 09/15/1955  Age: 68 y.o. MRN: [id number]  Chief Complaint  Patient presents with   Cough    X 1 week. Day and Nyquil, Vicks DM formula 44 cough syrup, productive-yellowish/greenish    HPI: Discussed the use of AI scribe software for clinical note transcription with the patient, who gave verbal consent to proceed.  History of Present Illness Andrea White is a 68 year old female who presents with vertigo and recent bronchitis.  Vertigo and associated neurological symptoms - On August 31st, experienced a severe episode of headache, weakness, nausea, and dizziness - Taken to [facility]; CT scan was normal - Persistent vertigo since initial episode, with dizziness upon standing or leaning down - Currently taking medication for vertigo three times daily - Vertigo has worsened in the context of recent bronchitis  Upper respiratory symptoms - Developed symptoms of bronchitis last week after exposure to sick contacts - Symptoms include congestion and sinus issues - No sore throat - Sensation of 'rattling' in the chest - No recent contact with daughter and granddaughter, who have COVID, since their diagnosis  Wrist fracture - Six weeks ago, slipped and fell at a barn, resulting in a hairline fracture of the wrist - Wore a splint for three weeks, then transitioned to a cast - Cast is scheduled to be removed tomorrow - Under the care of Dr. Florentina PA for this injury       08/27/2023   11:17 AM 05/26/2023   10:43 AM 02/28/2023    8:50 AM 11/26/2022   10:06 AM 06/05/2022   10:26 AM  Depression screen PHQ 2/9  Decreased Interest 0 0 1  0  Down, Depressed, Hopeless 0 0 2 0 0  PHQ - 2 Score 0 0 3 0 0  Altered sleeping  0 3 0 0  Tired, decreased energy  0 1 0 0  Change in appetite  0 2 1 0  Feeling bad or failure about yourself   0 0 0 0  Trouble concentrating  0 0 0 0  Moving slowly or fidgety/restless  0 1 0 0   Suicidal thoughts  0 0 0 0  PHQ-9 Score  0 10 1 0  Difficult doing work/chores  Not difficult at all Somewhat difficult Not difficult at all Not difficult at all        08/27/2023   11:17 AM  Fall Risk   Falls in the past year? 1  Number falls in past yr: 0  Injury with Fall? 1  Risk for fall due to : History of fall(s)  Follow up Falls evaluation completed    Patient Care Team: Sherre Clapper, MD as PCP - General (Family Medicine) Larnell Purchase, MD as Referring Physician (Orthopedic Surgery) Burt Fus, DPM as Consulting Physician (Podiatry) Darcel Pool, MD as Consulting Physician (Obstetrics and Gynecology) Erasmo Bernardino BRAVO, OD (Optometry) Gust Royden ORN, MD (Orthopedic Surgery) Roney, Jenna, OD as Referring Physician (Optometry)   Review of Systems  All other systems reviewed and are negative.   Current Outpatient Medications on File Prior to Visit  Medication Sig Dispense Refill   ALPRAZolam  (XANAX ) 0.5 MG tablet TAKE 1/2 TO 1 TABLET EVERY 8 HOURS AS NEEDED 30 tablet 2   Biotin 10000 MCG TABS Take 1 tablet by mouth daily.     diclofenac  Sodium (VOLTAREN ) 1 % GEL Apply 4 g topically 4 (four) times daily as needed (knees, foot, or  hand pain). 1000 g 3   DULoxetine  (CYMBALTA ) 60 MG capsule Take 1 capsule (60 mg total) by mouth daily. 90 capsule 1   Estradiol  10 MCG TABS vaginal tablet Place 1 tablet (10 mcg total) vaginally 2 (two) times a week. 8 tablet 1   JARDIANCE  10 MG TABS tablet TAKE 1 TABLET DAILY BEFORE BREAKFAST 90 tablet 1   linaclotide  (LINZESS ) 145 MCG CAPS capsule Take 1 capsule (145 mcg total) by mouth daily before breakfast. 90 capsule 0   loratadine  (CLARITIN ) 10 MG tablet Take 1 tablet (10 mg total) by mouth daily. 30 tablet 11   meclizine  (ANTIVERT ) 25 MG tablet Take 1 tablet (25 mg total) by mouth 2 (two) times daily. 180 tablet 1   Multiple Vitamin (MULTIVITAMIN) tablet Take 1 tablet by mouth daily.     nystatin  (MYCOSTATIN ) 100000 UNIT/ML suspension  Take 5 mLs (500,000 Units total) by mouth 4 (four) times daily as needed (thrush). Swish and spit. 473 mL 0   omeprazole  (PRILOSEC) 40 MG capsule Take 1 capsule (40 mg total) by mouth 2 (two) times daily. 180 capsule 1   ondansetron  (ZOFRAN -ODT) 4 MG disintegrating tablet Take 1 tablet (4 mg total) by mouth every 8 (eight) hours as needed for nausea or vomiting. 30 tablet 0   tiZANidine  (ZANAFLEX ) 2 MG tablet Take 1 tablet (2 mg total) by mouth at bedtime as needed for muscle spasms. 90 tablet 0   traMADol  (ULTRAM ) 50 MG tablet Take 1 tablet (50 mg total) by mouth 3 (three) times daily as needed. 90 tablet 1   TRUE METRIX BLOOD GLUCOSE TEST test strip USE AS DIRECTED TO CHECK FASTING BLOOD SUGAR. (Patient not taking: Reported on 08/27/2023) 300 strip 2   TRUEplus Lancets 33G MISC E11.69 use new lancet each time when checking FBS (Patient not taking: Reported on 08/27/2023) 100 each 2   valACYclovir  (VALTREX ) 1000 MG tablet Take 2 tablets (2,000 mg total) by mouth 2 (two) times daily as needed (x 1 day for each break out). 20 tablet 0   valsartan  (DIOVAN ) 40 MG tablet Take 1 tablet (40 mg total) by mouth daily. 90 tablet 1   No current facility-administered medications on file prior to visit.   Past Medical History:  Diagnosis Date   Allergy    Anxiety    Arthritis    Bradycardia, unspecified    Bruises easily    Calf pain    when walking   Fibromyalgia    Frequent headaches    GERD (gastroesophageal reflux disease)    H/O bladder problems    Hypertension    IBS (irritable bowel syndrome)    Localized edema    Mild persistent asthma, uncomplicated    Morbid (severe) obesity due to excess calories (HCC)    Muscle pain    Other hypotension    Ovarian cyst    Slow transit constipation    Solitary pulmonary nodule    Swelling    feet and legs   Type 2 diabetes mellitus with diabetic nephropathy (HCC)    Varicose veins of bilateral lower extremities with pain    Past Surgical History:   Procedure Laterality Date   BUNIONECTOMY     left foot 5th toe   CARDIAC CATHETERIZATION     carpel tunnel surgery     CESAREAN SECTION     CHOLECYSTECTOMY     GALLBLADDER SURGERY     TONSILLECTOMY     TUBAL LIGATION     VAGINAL HYSTERECTOMY  2003   LAVH/SR    Family History  Problem Relation Age of Onset   Heart disease Father    Hypertension Father    Stroke Father    Heart attack Father    Cerebrovascular Accident Father    Diabetes Sister    Kidney disease Sister    Colon cancer Maternal Aunt    Breast cancer Maternal Aunt    Cancer Maternal Aunt 30       breast   Migraines Daughter    Colon polyps Neg Hx    Esophageal cancer Neg Hx    Rectal cancer Neg Hx    Stomach cancer Neg Hx    Social History   Socioeconomic History   Marital status: Widowed    Spouse name: Kwanza Cancelliere   Number of children: 1   Years of education: Not on file   Highest education level: 12th grade  Occupational History   Occupation: Disabled due to foot problems since 1993  Tobacco Use   Smoking status: Never   Smokeless tobacco: Never  Vaping Use   Vaping status: Never Used  Substance and Sexual Activity   Alcohol use: No   Drug use: No   Sexual activity: Not Currently    Partners: Male    Birth control/protection: Surgical    Comment: hyst  Other Topics Concern   Not on file  Social History Narrative   Not on file   Social Drivers of Health   Financial Resource Strain: Patient Declined (02/27/2023)   Overall Financial Resource Strain (CARDIA)    Difficulty of Paying Living Expenses: Patient declined  Food Insecurity: Patient Declined (02/27/2023)   Hunger Vital Sign    Worried About Running Out of Food in the Last Year: Patient declined    Ran Out of Food in the Last Year: Patient declined  Transportation Needs: No Transportation Needs (02/27/2023)   PRAPARE - Administrator, Civil Service (Medical): No    Lack of Transportation (Non-Medical): No  Physical  Activity: Sufficiently Active (02/27/2023)   Exercise Vital Sign    Days of Exercise per Week: 7 days    Minutes of Exercise per Session: 60 min  Stress: No Stress Concern Present (02/27/2023)   [employer] of Occupational Health - Occupational Stress Questionnaire    Feeling of Stress : Only a little  Social Connections: Socially Isolated (02/27/2023)   Social Connection and Isolation Panel    Frequency of Communication with Friends and Family: More than three times a week    Frequency of Social Gatherings with Friends and Family: More than three times a week    Attends Religious Services: Never    Database administrator or Organizations: No    Attends Banker Meetings: Not on file    Marital Status: Widowed    Objective:  BP 126/68   Pulse 89   Temp 98.3 F (36.8 C)   Ht 5' 2 (1.575 m)   Wt 182 lb (82.6 kg)   SpO2 98%   BMI 33.29 kg/m      10/01/2023   10:28 AM 08/27/2023   11:13 AM 06/30/2023    9:06 AM  BP/Weight  Systolic BP 126 108 118  Diastolic BP 68 82 80  Wt. (Lbs) 182 195.8 191  BMI 33.29 kg/m2 35.81 kg/m2 34.93 kg/m2    Physical Exam Vitals reviewed.  Constitutional:      Appearance: Normal appearance.  HENT:     Right Ear: A middle  ear effusion is present. Tympanic membrane is erythematous (mild).     Left Ear: A middle ear effusion is present.     Nose: Congestion present.     Comments: BL max sinus tenderness.     Mouth/Throat:     Pharynx: Oropharynx is clear.  Cardiovascular:     Rate and Rhythm: Normal rate and regular rhythm.     Heart sounds: Normal heart sounds. No murmur heard. Pulmonary:     Effort: Pulmonary effort is normal. No respiratory distress.     Breath sounds: Normal breath sounds.  Lymphadenopathy:     Cervical: No cervical adenopathy.  Neurological:     Mental Status: She is alert and oriented to person, place, and time.  Psychiatric:        Mood and Affect: Mood normal.        Behavior: Behavior normal.          Lab Results  Component Value Date   WBC 10.0 05/26/2023   HGB 14.9 05/26/2023   HCT 46.2 05/26/2023   PLT 353 05/26/2023   GLUCOSE 65 (L) 05/26/2023   CHOL 198 05/26/2023   TRIG 115 05/26/2023   HDL 68 05/26/2023   LDLCALC 110 (H) 05/26/2023   ALT 18 05/26/2023   AST 17 05/26/2023   NA 144 05/26/2023   K 4.3 05/26/2023   CL 105 05/26/2023   CREATININE 1.07 (H) 05/26/2023   BUN 21 05/26/2023   CO2 23 05/26/2023   TSH 0.917 02/28/2023   HGBA1C 7.0 (H) 05/26/2023      Assessment & Plan:  Acute bronchitis due to other specified organisms Assessment & Plan: - Prescribe ZPak. - Prescribe prednisone . - kenalog  80 mg IM given.   Orders: -     POC COVID-19 BinaxNow -     POCT Influenza A/B -     Triamcinolone  Acetonide -     predniSONE ; Take 1 tablet (50 mg total) by mouth daily with breakfast.  Dispense: 5 tablet; Refill: 0  Acute non-recurrent sinusitis of other sinus Assessment & Plan: - Prescribe ZPak. - Prescribe prednisone . - kenalog  80 mg IM given.   Orders: -     Azithromycin ; 2 DAILY FOR FIRST DAY, THEN DECREASE TO ONE DAILY FOR 4 MORE DAYS.  Dispense: 6 tablet; Refill: 0  Other infective acute otitis externa of right ear Assessment & Plan: - Prescribe ZPak. - Prescribe prednisone . - kenalog  80 mg IM given.   Orders: -     POC COVID-19 BinaxNow -     POCT Influenza A/B  Vertigo Assessment & Plan: Chronic vertigo exacerbated by bronchitis, managed with medication. - Continue current vertigo medication three times a day.   Encounter for screening mammogram for malignant neoplasm of breast -     3D Screening Mammogram, Left and Right; Future  Other orders -     Pravastatin  Sodium; Take 1 tablet (40 mg total) by mouth daily.  Dispense: 90 tablet; Refill: 1     Assessment and Plan    Meds ordered this encounter  Medications   pravastatin  (PRAVACHOL ) 40 MG tablet    Sig: Take 1 tablet (40 mg total) by mouth daily.    Dispense:   90 tablet    Refill:  1   azithromycin  (ZITHROMAX ) 250 MG tablet    Sig: 2 DAILY FOR FIRST DAY, THEN DECREASE TO ONE DAILY FOR 4 MORE DAYS.    Dispense:  6 tablet    Refill:  0   triamcinolone  acetonide (  KENALOG -40) injection 80 mg   predniSONE  (DELTASONE ) 50 MG tablet    Sig: Take 1 tablet (50 mg total) by mouth daily with breakfast.    Dispense:  5 tablet    Refill:  0    Orders Placed This Encounter  Procedures   MM 3D SCREENING MAMMOGRAM BILATERAL BREAST   POC COVID-19 BinaxNow   POCT Influenza A/B     Follow-up: No follow-ups on file.   I,Marla I Leal-Borjas,acting as a scribe for Abigail Free, MD.,have documented all relevant documentation on the behalf of Abigail Free, MD,as directed by  Abigail Free, MD while in the presence of Abigail Free, MD.    An After Visit Summary was printed and given to the patient.  I attest that I have reviewed this visit and agree with the plan scribed by my staff.   Abigail Free, MD [facility] [phone]

## 2023-10-06 ENCOUNTER — Other Ambulatory Visit: Payer: Self-pay | Admitting: Family Medicine

## 2023-10-06 DIAGNOSIS — I5041 Acute combined systolic (congestive) and diastolic (congestive) heart failure: Secondary | ICD-10-CM

## 2023-10-08 ENCOUNTER — Ambulatory Visit
Admission: RE | Admit: 2023-10-08 | Discharge: 2023-10-08 | Disposition: A | Discharge status: Home / Self Care | Source: Ambulatory Visit | Attending: Family Medicine | Admitting: Family Medicine

## 2023-10-08 DIAGNOSIS — Z1231 Encounter for screening mammogram for malignant neoplasm of breast: Secondary | ICD-10-CM

## 2023-10-08 NOTE — Assessment & Plan Note (Signed)
 Chronic vertigo exacerbated by bronchitis, managed with medication. - Continue current vertigo medication three times a day.

## 2023-10-08 NOTE — Assessment & Plan Note (Signed)
-   Prescribe ZPak. - Prescribe prednisone . - kenalog  80 mg IM given.

## 2023-10-10 ENCOUNTER — Ambulatory Visit: Payer: Self-pay | Admitting: Family Medicine

## 2023-10-22 ENCOUNTER — Other Ambulatory Visit: Payer: Self-pay | Admitting: Family Medicine

## 2023-10-29 ENCOUNTER — Other Ambulatory Visit: Payer: Self-pay | Admitting: Gastroenterology

## 2023-10-29 ENCOUNTER — Other Ambulatory Visit: Payer: Self-pay | Admitting: Family Medicine

## 2023-10-29 DIAGNOSIS — E1142 Type 2 diabetes mellitus with diabetic polyneuropathy: Secondary | ICD-10-CM

## 2023-10-29 DIAGNOSIS — J4541 Moderate persistent asthma with (acute) exacerbation: Secondary | ICD-10-CM

## 2023-10-29 DIAGNOSIS — M797 Fibromyalgia: Secondary | ICD-10-CM

## 2023-10-30 ENCOUNTER — Encounter: Payer: Self-pay | Admitting: Family Medicine

## 2023-10-30 ENCOUNTER — Other Ambulatory Visit: Payer: Self-pay | Admitting: Family Medicine

## 2023-10-30 MED ORDER — FINASTERIDE 5 MG PO TABS: 5.0000 mg | ORAL_TABLET | Freq: Every day | ORAL | 2 refills | Status: DC

## 2023-10-31 ENCOUNTER — Other Ambulatory Visit: Payer: Self-pay | Admitting: Family Medicine

## 2023-10-31 ENCOUNTER — Other Ambulatory Visit

## 2023-10-31 DIAGNOSIS — L650 Telogen effluvium: Secondary | ICD-10-CM

## 2023-11-01 LAB — CBC WITH DIFFERENTIAL/PLATELET
Basophils Absolute: 0.1 x10E3/uL (ref 0.0–0.2)
Basos: 1 %
EOS (ABSOLUTE): 0.1 x10E3/uL (ref 0.0–0.4)
Eos: 2 %
Hematocrit: 44.7 % (ref 34.0–46.6)
Hemoglobin: 14.4 g/dL (ref 11.1–15.9)
Immature Grans (Abs): 0.1 x10E3/uL (ref 0.0–0.1)
Immature Granulocytes: 1 %
Lymphocytes Absolute: 2.8 x10E3/uL (ref 0.7–3.1)
Lymphs: 36 %
MCH: 30.8 pg (ref 26.6–33.0)
MCHC: 32.2 g/dL (ref 31.5–35.7)
MCV: 96 fL (ref 79–97)
Monocytes Absolute: 0.7 x10E3/uL (ref 0.1–0.9)
Monocytes: 8 %
Neutrophils Absolute: 4.2 x10E3/uL (ref 1.4–7.0)
Neutrophils: 52 %
Platelets: 323 x10E3/uL (ref 150–450)
RBC: 4.67 x10E6/uL (ref 3.77–5.28)
RDW: 13.8 % (ref 11.7–15.4)
WBC: 8 x10E3/uL (ref 3.4–10.8)

## 2023-11-01 LAB — COMPREHENSIVE METABOLIC PANEL WITH GFR
ALT: 13 IU/L (ref 0–32)
AST: 18 IU/L (ref 0–40)
Albumin: 3.8 g/dL — ABNORMAL LOW (ref 3.9–4.9)
Alkaline Phosphatase: 71 IU/L (ref 49–135)
BUN/Creatinine Ratio: 18 (ref 12–28)
BUN: 16 mg/dL (ref 8–27)
Bilirubin Total: 0.8 mg/dL (ref 0.0–1.2)
CO2: 25 mmol/L (ref 20–29)
Calcium: 9 mg/dL (ref 8.7–10.3)
Chloride: 105 mmol/L (ref 96–106)
Creatinine, Ser: 0.88 mg/dL (ref 0.57–1.00)
Globulin, Total: 2 g/dL (ref 1.5–4.5)
Glucose: 85 mg/dL (ref 70–99)
Potassium: 3.8 mmol/L (ref 3.5–5.2)
Sodium: 143 mmol/L (ref 134–144)
Total Protein: 5.8 g/dL — ABNORMAL LOW (ref 6.0–8.5)
eGFR: 73 mL/min/1.73 (ref >59)

## 2023-11-01 LAB — TSH: TSH: 0.478 u[IU]/mL (ref 0.450–4.500)

## 2023-11-01 LAB — FERRITIN: Ferritin: 22 ng/mL (ref 15–150)

## 2023-11-02 ENCOUNTER — Ambulatory Visit: Payer: Self-pay | Admitting: Family Medicine

## 2023-11-04 DIAGNOSIS — M1711 Unilateral primary osteoarthritis, right knee: Secondary | ICD-10-CM | POA: Diagnosis not present

## 2023-11-13 ENCOUNTER — Other Ambulatory Visit: Payer: Self-pay | Admitting: Family Medicine

## 2023-11-13 DIAGNOSIS — E1142 Type 2 diabetes mellitus with diabetic polyneuropathy: Secondary | ICD-10-CM

## 2023-11-13 NOTE — Telephone Encounter (Signed)
 Copied from CRM #[id number]. Topic: Clinical - Medication Refill >> Nov 13, 2023 12:13 PM [name] wrote: Medication: JARDIANCE  10 MG TABS tablet   Has the patient contacted their pharmacy? Yes, advised pt to call office. Pt is requesting 90 day supply  This is the patient's preferred pharmacy:  [facility] [address] - [place], [place] - [address] [place] [address] Phone: [phone] Fax: [phone]  Is this the correct pharmacy for this prescription? Yes If no, delete pharmacy and type the correct one.   Has the prescription been filled recently? Yes  Is the patient out of the medication? Yes  Has the patient been seen for an appointment in the last year OR does the patient have an upcoming appointment? Yes  Can we respond through MyChart? Yes  Agent: Please be advised that Rx refills may take up to 3 business days. We ask that you follow-up with your pharmacy.

## 2023-11-14 ENCOUNTER — Other Ambulatory Visit: Payer: Self-pay | Admitting: Family Medicine

## 2023-11-14 DIAGNOSIS — E1142 Type 2 diabetes mellitus with diabetic polyneuropathy: Secondary | ICD-10-CM

## 2023-11-14 MED ORDER — EMPAGLIFLOZIN 10 MG PO TABS
10.0000 mg | ORAL_TABLET | Freq: Every day | ORAL | 1 refills | Status: AC
Start: 2023-11-14 — End: ?

## 2023-12-15 NOTE — Assessment & Plan Note (Addendum)
 Cholesterol levels improved. Current medication: pravastatin . - Continue pravastatin  40 mg daily. Orders:   POCT Lipid Panel

## 2023-12-15 NOTE — Assessment & Plan Note (Addendum)
 A1c improved to 6.3. No significant pain, tension headaches noted. Current medications: Jardiance  and duloxetine . - Continue Jardiance  10 mg daily. - Continue duloxetine  as prescribed. Orders:   POCT glycosylated hemoglobin (Hb A1C)

## 2023-12-15 NOTE — Progress Notes (Signed)
 Subjective:  Patient ID: Andrea White, female    DOB: 14-Jul-1955  Age: 68 y.o. MRN: [id number]  Chief Complaint  Patient presents with   Medical Management of Chronic Issues    HPI: Discussed the use of AI scribe software for clinical note transcription with the patient, who gave verbal consent to proceed.  History of Present Illness Andrea White is a 68 year old female who presents with bruising and cuts on her body.  Cutaneous trauma and bruising - Bruising and cuts present on body. - Two large gashes on right forearm after running into wire while weed eating. - Up to date with tetanus immunization.  Cephalalgia - Stress headaches attributed to financial concerns and recent changes in insurance coverage. - No fevers, chills, or other types of headaches.  Glycemic control - Hemoglobin A1c is 6.3, previously 7, indicating improvement. - Jardiance  10 mg daily for diabetes management. - Attributes lower A1c to decreased food intake.  Lipid profile - Total cholesterol 147 mg/dL. - Triglycerides 139 mg/dL. - HDL 59 mg/dL. - LDL 60 mg/dL, previously 889 mg/dL, indicating improvement. - Pravastatin  40 mg daily for lipid management.  Breast swelling - Swelling in breasts, considered unusual by patient. - Denies pregnancy. - Reports a friend with similar symptoms.  Respiratory symptoms - No chest pain or dyspnea. - Wheezing present over the past couple of weeks. - No known history of asthma, but experiences wheezing during episodes of bronchitis.  Gastrointestinal function - Improved bowel movements since starting Linzess  145 mcg daily. - Stools are soft and regular. - Omeprazole  40 mg twice a day for gastrointestinal symptoms.  Pain management - Tramadol  50 mg twice a day as needed for pain. - Duloxetine  for pain and/or mood.  Dietary habits - Diet consists of cereal, popcorn, and some fruits and vegetables.       12/16/2023   10:47 AM 08/27/2023   11:17  AM 05/26/2023   10:43 AM 02/28/2023    8:50 AM 11/26/2022   10:06 AM  Depression screen PHQ 2/9  Decreased Interest 0 0 0 1   Down, Depressed, Hopeless 0 0 0 2 0  PHQ - 2 Score 0 0 0 3 0  Altered sleeping   0 3 0  Tired, decreased energy   0 1 0  Change in appetite   0 2 1  Feeling bad or failure about yourself    0 0 0  Trouble concentrating   0 0 0  Moving slowly or fidgety/restless   0 1 0  Suicidal thoughts   0 0 0  PHQ-9 Score   0  10  1   Difficult doing work/chores   Not difficult at all Somewhat difficult Not difficult at all     Data saved with a previous flowsheet row definition        12/16/2023   10:40 AM  Fall Risk   Falls in the past year? 0  Number falls in past yr: 0  Injury with Fall? 0  Risk for fall due to : History of fall(s)  Follow up Falls evaluation completed    Patient Care Team: Sherre Clapper, MD as PCP - General (Family Medicine) Larnell Purchase, MD as Referring Physician (Orthopedic Surgery) Burt Fus, DPM as Consulting Physician (Podiatry) Darcel Pool, MD as Consulting Physician (Obstetrics and Gynecology) Erasmo Bernardino BRAVO, OD (Optometry) Gust Royden ORN, MD (Orthopedic Surgery) Roney, Jenna, OD as Referring Physician (Optometry)   Review of Systems  All other systems  reviewed and are negative.   Current Outpatient Medications on File Prior to Visit  Medication Sig Dispense Refill   Biotin 89999 MCG TABS Take 1 tablet by mouth daily.     DULoxetine  (CYMBALTA ) 60 MG capsule Take 1 capsule (60 mg total) by mouth daily. 90 capsule 1   empagliflozin  (JARDIANCE ) 10 MG TABS tablet Take 1 tablet (10 mg total) by mouth daily before breakfast. 90 tablet 1   loratadine  (CLARITIN ) 10 MG tablet Take 1 tablet (10 mg total) by mouth daily. 30 tablet 11   meclizine  (ANTIVERT ) 25 MG tablet TAKE 1 TABLET TWICE A DAY 180 tablet 1   Multiple Vitamin (MULTIVITAMIN) tablet Take 1 tablet by mouth daily.     nystatin  (MYCOSTATIN ) 100000 UNIT/ML suspension SWISH  AND SPIT BY MOUTH4 TIMES A DAY AS NEEDED      (THRUSH) 120 mL 2   omeprazole  (PRILOSEC) 40 MG capsule Take 1 capsule (40 mg total) by mouth 2 (two) times daily. 180 capsule 1   ondansetron  (ZOFRAN -ODT) 4 MG disintegrating tablet Take 1 tablet (4 mg total) by mouth every 8 (eight) hours as needed for nausea or vomiting. 30 tablet 0   pravastatin  (PRAVACHOL ) 40 MG tablet TAKE 1 TABLET DAILY 90 tablet 1   traMADol  (ULTRAM ) 50 MG tablet Take 1 tablet (50 mg total) by mouth 3 (three) times daily as needed. 90 tablet 1   valACYclovir  (VALTREX ) 1000 MG tablet Take 2 tablets (2,000 mg total) by mouth 2 (two) times daily as needed (x 1 day for each break out). 20 tablet 0   valsartan  (DIOVAN ) 40 MG tablet Take 1 tablet (40 mg total) by mouth daily. 90 tablet 1   No current facility-administered medications on file prior to visit.   Past Medical History:  Diagnosis Date   Allergy    Anxiety    Arthritis    Bradycardia, unspecified    Bruises easily    Calf pain    when walking   Fibromyalgia    Frequent headaches    GERD (gastroesophageal reflux disease)    H/O bladder problems    Hypertension    IBS (irritable bowel syndrome)    Localized edema    Mild persistent asthma, uncomplicated    Morbid (severe) obesity due to excess calories (HCC)    Muscle pain    Other hypotension    Ovarian cyst    Slow transit constipation    Solitary pulmonary nodule    Swelling    feet and legs   Type 2 diabetes mellitus with diabetic nephropathy (HCC)    Varicose veins of bilateral lower extremities with pain    Past Surgical History:  Procedure Laterality Date   BUNIONECTOMY     left foot 5th toe   CARDIAC CATHETERIZATION     carpel tunnel surgery     CESAREAN SECTION     CHOLECYSTECTOMY     GALLBLADDER SURGERY     TONSILLECTOMY     TUBAL LIGATION     VAGINAL HYSTERECTOMY  2003   LAVH/SR    Family History  Problem Relation Age of Onset   Heart disease Father    Hypertension Father     Stroke Father    Heart attack Father    Cerebrovascular Accident Father    Diabetes Sister    Kidney disease Sister    Colon cancer Maternal Aunt    Breast cancer Maternal Aunt    Cancer Maternal Aunt 8  breast   Migraines Daughter    Colon polyps Neg Hx    Esophageal cancer Neg Hx    Rectal cancer Neg Hx    Stomach cancer Neg Hx    Social History   Socioeconomic History   Marital status: Widowed    Spouse name: Vernadette Stutsman   Number of children: 1   Years of education: Not on file   Highest education level: 12th grade  Occupational History   Occupation: Disabled due to foot problems since 1993  Tobacco Use   Smoking status: Never   Smokeless tobacco: Never  Vaping Use   Vaping status: Never Used  Substance and Sexual Activity   Alcohol use: No   Drug use: No   Sexual activity: Not Currently    Partners: Male    Birth control/protection: Surgical    Comment: hyst  Other Topics Concern   Not on file  Social History Narrative   Not on file   Social Drivers of Health   Financial Resource Strain: Patient Declined (02/27/2023)   Overall Financial Resource Strain (CARDIA)    Difficulty of Paying Living Expenses: Patient declined  Food Insecurity: No Food Insecurity (12/16/2023)   Hunger Vital Sign    Worried About Running Out of Food in the Last Year: Never true    Ran Out of Food in the Last Year: Never true  Transportation Needs: No Transportation Needs (12/16/2023)   PRAPARE - Administrator, Civil Service (Medical): No    Lack of Transportation (Non-Medical): No  Physical Activity: Sufficiently Active (12/16/2023)   Exercise Vital Sign    Days of Exercise per Week: 7 days    Minutes of Exercise per Session: 60 min  Stress: Stress Concern Present (12/16/2023)   [employer] of Occupational Health - Occupational Stress Questionnaire    Feeling of Stress: To some extent  Social Connections: Socially Isolated (12/16/2023)   Social  Connection and Isolation Panel    Frequency of Communication with Friends and Family: More than three times a week    Frequency of Social Gatherings with Friends and Family: More than three times a week    Attends Religious Services: Never    Database Administrator or Organizations: No    Attends Banker Meetings: Never    Marital Status: Widowed    Objective:  BP 128/82 (BP Location: Left Arm, Patient Position: Sitting)   Pulse 67   Temp 97.8 F (36.6 C) (Temporal)   Ht 5' 2 (1.575 m)   Wt 196 lb 9.6 oz (89.2 kg)   SpO2 97%   BMI 35.96 kg/m      12/16/2023    2:48 PM 12/16/2023   10:56 AM 12/16/2023   10:03 AM  BP/Weight  Systolic BP 110 128 128  Diastolic BP 78 82 82  Wt. (Lbs) 198.25 196.65 196.6  BMI 36.26 kg/m2 35.97 kg/m2 35.96 kg/m2    Physical Exam Vitals reviewed.  Constitutional:      Appearance: Normal appearance. She is normal weight.  Neck:     Vascular: No carotid bruit.  Cardiovascular:     Rate and Rhythm: Normal rate and regular rhythm.     Pulses: Normal pulses.     Heart sounds: Normal heart sounds.  Pulmonary:     Effort: Pulmonary effort is normal. No respiratory distress.     Breath sounds: Normal breath sounds.  Abdominal:     General: Abdomen is flat. Bowel sounds are normal.  Palpations: Abdomen is soft.     Tenderness: There is no abdominal tenderness.  Neurological:     Mental Status: She is alert and oriented to person, place, and time.  Psychiatric:        Mood and Affect: Mood normal.        Behavior: Behavior normal.     {Perform Simple Foot Exam  Perform Detailed exam:1} Diabetic foot exam was performed with the following findings:   Normal sensation of 10g monofilament Intact posterior tibialis and dorsalis pedis pulses PES PLANUS, BUNIONS BL.        Lab Results  Component Value Date   WBC 8.0 10/31/2023   HGB 14.4 10/31/2023   HCT 44.7 10/31/2023   PLT 323 10/31/2023   GLUCOSE 85 10/31/2023    CHOL 198 05/26/2023   TRIG 115 05/26/2023   HDL 68 05/26/2023   LDLCALC 110 (H) 05/26/2023   ALT 13 10/31/2023   AST 18 10/31/2023   NA 143 10/31/2023   K 3.8 10/31/2023   CL 105 10/31/2023   CREATININE 0.88 10/31/2023   BUN 16 10/31/2023   CO2 25 10/31/2023   TSH 0.478 10/31/2023   HGBA1C 6.3 12/16/2023    Results for orders placed or performed in visit on 12/16/23  POCT glycosylated hemoglobin (Hb A1C)   Collection Time: 12/16/23 10:34 AM  Result Value Ref Range   Hemoglobin A1C     HbA1c POC (<> result, manual entry) 6.3 4.0 - 5.6 %   HbA1c, POC (prediabetic range)     HbA1c, POC (controlled diabetic range)    POCT Lipid Panel   Collection Time: 12/16/23 10:35 AM  Result Value Ref Range   TC 147    HDL 59    TRG 139    LDL 60    Non-HDL 88    TC/HDL 1.0   .  Assessment & Plan:   Assessment & Plan Diabetic polyneuropathy associated with type 2 diabetes mellitus (HCC) A1c improved to 6.3. No significant pain, tension headaches noted. Current medications: Jardiance  and duloxetine . - Continue Jardiance  10 mg daily. - Continue duloxetine  as prescribed. Orders:   POCT glycosylated hemoglobin (Hb A1C)  Mixed hyperlipidemia Cholesterol levels improved. Current medication: pravastatin . - Continue pravastatin  40 mg daily. Orders:   POCT Lipid Panel  Chronic idiopathic constipation Constipation improved with Linzess . Bowel movements regular and soft. - Continue Linzess  145 mcg as prescribed.    Adult wellness visit Annual wellness visit conducted. Flu shot administered.    Moderate persistent asthma without complication Recent wheezing noted, possibly related to bronchitis.    Acute non intractable tension-type headache Tension headaches likely related to stress from yard work. No significant anxiety or depression. - Continue current management and monitor symptoms.    Wound, open, forearm, right, initial encounter Multiple cuts and bruises from weed  eating incident. One cut healing. Tetanus vaccination up to date. - Ensure tetanus vaccination remains up to date.     Body mass index is 35.96 kg/m.   Meds ordered this encounter  Medications   ALPRAZolam  (XANAX ) 0.5 MG tablet    Sig: TAKE 1/2 TO 1 TABLET by mouth EVERY 8 HOURS AS NEEDED    Dispense:  30 tablet    Refill:  5    Orders Placed This Encounter  Procedures   POCT Lipid Panel   POCT glycosylated hemoglobin (Hb A1C)     I,Marla I Leal-Borjas,acting as a scribe for Abigail Free, MD.,have documented all relevant documentation on the behalf  of Abigail Free, MD,as directed by  Abigail Free, MD while in the presence of Abigail Free, MD.   Follow-up: Return in about 6 months (around 06/14/2024) for chronic follow up.  An After Visit Summary was printed and given to the patient.  Abigail Free, MD [facility] [phone]

## 2023-12-16 ENCOUNTER — Ambulatory Visit: Admitting: Gastroenterology

## 2023-12-16 ENCOUNTER — Encounter: Payer: Self-pay | Admitting: Family Medicine

## 2023-12-16 ENCOUNTER — Ambulatory Visit

## 2023-12-16 ENCOUNTER — Ambulatory Visit (INDEPENDENT_AMBULATORY_CARE_PROVIDER_SITE_OTHER): Admitting: Family Medicine

## 2023-12-16 ENCOUNTER — Encounter: Payer: Self-pay | Admitting: Gastroenterology

## 2023-12-16 VITALS — BP 128/82 | HR 67 | Temp 97.8°F | Ht 62.0 in | Wt 196.6 lb

## 2023-12-16 VITALS — BP 128/82 | HR 67 | Temp 97.8°F | Resp 16 | Ht 62.0 in | Wt 196.7 lb

## 2023-12-16 VITALS — BP 110/78 | HR 84 | Ht 62.0 in | Wt 198.2 lb

## 2023-12-16 DIAGNOSIS — S51801A Unspecified open wound of right forearm, initial encounter: Secondary | ICD-10-CM | POA: Diagnosis not present

## 2023-12-16 DIAGNOSIS — K219 Gastro-esophageal reflux disease without esophagitis: Secondary | ICD-10-CM

## 2023-12-16 DIAGNOSIS — Z0000 Encounter for general adult medical examination without abnormal findings: Secondary | ICD-10-CM

## 2023-12-16 DIAGNOSIS — G44209 Tension-type headache, unspecified, not intractable: Secondary | ICD-10-CM | POA: Diagnosis not present

## 2023-12-16 DIAGNOSIS — J4540 Moderate persistent asthma, uncomplicated: Secondary | ICD-10-CM

## 2023-12-16 DIAGNOSIS — E782 Mixed hyperlipidemia: Secondary | ICD-10-CM | POA: Diagnosis not present

## 2023-12-16 DIAGNOSIS — Z860100 Personal history of colon polyps, unspecified: Secondary | ICD-10-CM | POA: Diagnosis not present

## 2023-12-16 DIAGNOSIS — K581 Irritable bowel syndrome with constipation: Secondary | ICD-10-CM

## 2023-12-16 DIAGNOSIS — K5904 Chronic idiopathic constipation: Secondary | ICD-10-CM

## 2023-12-16 DIAGNOSIS — E1142 Type 2 diabetes mellitus with diabetic polyneuropathy: Secondary | ICD-10-CM | POA: Diagnosis not present

## 2023-12-16 DIAGNOSIS — R1310 Dysphagia, unspecified: Secondary | ICD-10-CM

## 2023-12-16 DIAGNOSIS — Z23 Encounter for immunization: Secondary | ICD-10-CM

## 2023-12-16 LAB — POCT GLYCOSYLATED HEMOGLOBIN (HGB A1C): HbA1c POC (<> result, manual entry): 6.3 % (ref 4.0–5.6)

## 2023-12-16 LAB — POCT LIPID PANEL
HDL: 59
LDL: 60
Non-HDL: 88
TC/HDL: 1
TC: 147
TRG: 139

## 2023-12-16 MED ORDER — LINACLOTIDE 72 MCG PO CAPS: 72.0000 ug | ORAL_CAPSULE | Freq: Every day | ORAL | Status: DC

## 2023-12-16 MED ORDER — ALPRAZOLAM 0.5 MG PO TABS: ORAL_TABLET | ORAL | 5 refills | Status: AC

## 2023-12-16 NOTE — Patient Instructions (Signed)
 _______________________________________________________  If your blood pressure at your visit was 140/90 or greater, please contact your primary care physician to follow up on this.  _______________________________________________________  If you are age 65 or older, your body mass index should be between 23-30. Your Body mass index is 36.26 kg/m. If this is out of the aforementioned range listed, please consider follow up with your Primary Care Provider.  If you are age 54 or younger, your body mass index should be between 19-25. Your Body mass index is 36.26 kg/m. If this is out of the aformentioned range listed, please consider follow up with your Primary Care Provider.   ________________________________________________________  The [facility] providers would like to encourage you to use MYCHART to communicate with providers for non-urgent requests or questions.  Due to long hold times on the telephone, sending your provider a message by [facility] may be a faster and more efficient way to get a response.  Please allow 48 business hours for a response.  Please remember that this is for non-urgent requests.  _______________________________________________________  [facility] is using a team-based approach to care.  Your team is made up of your doctor and two to three APPS. Our APPS (Nurse Practitioners and Physician Assistants) work with your physician to ensure care continuity for you. They are fully qualified to address your health concerns and develop a treatment plan. They communicate directly with your gastroenterologist to care for you. Seeing the Advanced Practice Practitioners on your physician's team can help you by facilitating care more promptly, often allowing for earlier appointments, access to diagnostic testing, procedures, and other specialty referrals.   We have given you samples of the following medication to take:  Linzess  72mcg one capsule daily  Linzess  works  best when taken once a day every day, on an empty stomach, at least 30 minutes before your first meal of the day.  When Linzess  is taken daily as directed:  *Constipation relief is typically felt in about a week *IBS-C patients may begin to experience relief from belly pain and overall abdominal symptoms (pain, discomfort, and bloating) in about 1 week,   with symptoms typically improving over 12 weeks.  Diarrhea may occur in the first 2 weeks -keep taking it.  The diarrhea should go away and you should start having normal, complete, full bowel movements. It may be helpful to start treatment when you can be near the comfort of your own bathroom, such as a weekend.

## 2023-12-16 NOTE — Patient Instructions (Signed)
  VISIT SUMMARY: Today, you had your annual wellness visit. We discussed your recent bruising and cuts, stress headaches, diabetes management, cholesterol levels, breast swelling, wheezing, and gastrointestinal function. You received a flu shot during this visit.  YOUR PLAN: TYPE 2 DIABETES MELLITUS WITH DIABETIC POLYNEUROPATHY: Your blood sugar levels have improved, with your A1c now at 6.3. You are currently taking Jardiance  and duloxetine . -Continue taking Jardiance  10 mg daily.  MIXED HYPERLIPIDEMIA: Your cholesterol levels have improved. -Continue taking pravastatin  40 mg daily.  CONSTIPATION: Your bowel movements have improved and are regular and soft with Linzess . -Continue taking Linzess  145 mcg as prescribed.  TENSION-TYPE HEADACHE: Your headaches are likely related to stress from yard work. -Continue current management and monitor your symptoms.  CUTANEOUS WOUNDS: You have multiple cuts and bruises from a recent incident with weed eating. One cut is healing, and your tetanus vaccination is up to date. -Ensure your tetanus vaccination remains up to date.  CHRONIC BACK PAIN/FIBROMYALGIA: -Continue taking duloxetine  as prescribed. Continue tramadol  three times a day as needed.   GENERAL HEALTH MAINTENANCE: Annual wellness visit conducted. -You received a flu shot today.                      Contains text generated by [employer]bridge.                                 Contains text generated by [employer]bridge.

## 2023-12-16 NOTE — Progress Notes (Signed)
 Chief Complaint: Follow-up with constipation Primary GI MD: Dr. Federico  HPI: Discussed the use of AI scribe software for clinical note transcription with the patient, who gave verbal consent to proceed.  Andrea White is a 68 year old female who presents for follow-up regarding her medication management for constipation.  She has been taking Linzess  145 mcg for constipation since early June. The medication sometimes causes diarrhea-like symptoms, leading her to adjust her intake to every other day for the past two weeks. She is considering trying a lower dose of 72 mcg to manage these side effects.  She has a history of gastroesophageal reflux disease (GERD) and takes omeprazole  40 mg twice daily, which effectively controls her reflux symptoms. She has not attempted to reduce the dosage recently, as a previous attempt a couple of years ago was unsuccessful.  She has undergone endoscopies with esophageal dilation alongside colonoscopies in the past. Currently, she does not experience significant trouble swallowing and manages by taking small bites and crushing pills when necessary. She has a history of reflux but no current swallowing difficulties.  She recalls a colonoscopy where 11 polyps were removed, including 8 precancerous and 3 more severe precancerous polyps. She declines genetic testing despite the number of polyps.  She recently visited her primary doctor, where she received a flu shot and had her A1c and cholesterol checked using a new device. Her A1c improved from 6.9 to 6.3, and her cholesterol levels have decreased significantly.   PREVIOUS GI WORKUP   Colonoscopy 12/2022 - 11 3 to 10 mm polyps in the ascending colon and in the cecum, removed with a cold snare. Resected and retrieved.  - Diverticulosis in the sigmoid colon.  - Non- bleeding internal hemorrhoids. - repeat 3 years (12/2025)   . Surgical [P], colon, ascending and cecum, polyp (11) :       TUBULAR ADENOMA  (8) WITHOUT HIGH GRADE DYSPLASIA.       SESSILE SERRATED POLYP (3) WITHOUT CYTOLOGIC DYSPLASIA.   Past Medical History:  Diagnosis Date   Allergy    Anxiety    Arthritis    Bradycardia, unspecified    Bruises easily    Calf pain    when walking   Fibromyalgia    Frequent headaches    GERD (gastroesophageal reflux disease)    H/O bladder problems    Hypertension    IBS (irritable bowel syndrome)    Localized edema    Mild persistent asthma, uncomplicated    Morbid (severe) obesity due to excess calories (HCC)    Muscle pain    Other hypotension    Ovarian cyst    Slow transit constipation    Solitary pulmonary nodule    Swelling    feet and legs   Type 2 diabetes mellitus with diabetic nephropathy (HCC)    Varicose veins of bilateral lower extremities with pain     Past Surgical History:  Procedure Laterality Date   BUNIONECTOMY     left foot 5th toe   CARDIAC CATHETERIZATION     carpel tunnel surgery     CESAREAN SECTION     CHOLECYSTECTOMY     GALLBLADDER SURGERY     TONSILLECTOMY     TUBAL LIGATION     VAGINAL HYSTERECTOMY  2003   LAVH/SR    Current Outpatient Medications  Medication Sig Dispense Refill   ALPRAZolam  (XANAX ) 0.5 MG tablet TAKE 1/2 TO 1 TABLET by mouth EVERY 8 HOURS AS NEEDED 30 tablet 5  Biotin 89999 MCG TABS Take 1 tablet by mouth daily.     DULoxetine  (CYMBALTA ) 60 MG capsule Take 1 capsule (60 mg total) by mouth daily. 90 capsule 1   empagliflozin  (JARDIANCE ) 10 MG TABS tablet Take 1 tablet (10 mg total) by mouth daily before breakfast. 90 tablet 1   loratadine  (CLARITIN ) 10 MG tablet Take 1 tablet (10 mg total) by mouth daily. 30 tablet 11   meclizine  (ANTIVERT ) 25 MG tablet TAKE 1 TABLET TWICE A DAY 180 tablet 1   Multiple Vitamin (MULTIVITAMIN) tablet Take 1 tablet by mouth daily.     nystatin  (MYCOSTATIN ) 100000 UNIT/ML suspension SWISH AND SPIT 5ML BY MOUTH4 TIMES A DAY AS NEEDED      (THRUSH) 120 mL 2   omeprazole  (PRILOSEC) 40 MG  capsule Take 1 capsule (40 mg total) by mouth 2 (two) times daily. 180 capsule 1   ondansetron  (ZOFRAN -ODT) 4 MG disintegrating tablet Take 1 tablet (4 mg total) by mouth every 8 (eight) hours as needed for nausea or vomiting. 30 tablet 0   pravastatin  (PRAVACHOL ) 40 MG tablet TAKE 1 TABLET DAILY 90 tablet 1   traMADol  (ULTRAM ) 50 MG tablet Take 1 tablet (50 mg total) by mouth 3 (three) times daily as needed. 90 tablet 1   valACYclovir  (VALTREX ) 1000 MG tablet Take 2 tablets (2,000 mg total) by mouth 2 (two) times daily as needed (x 1 day for each break out). 20 tablet 0   valsartan  (DIOVAN ) 40 MG tablet Take 1 tablet (40 mg total) by mouth daily. 90 tablet 1   linaclotide  (LINZESS ) 72 MCG capsule Take 1 capsule (72 mcg total) by mouth daily before breakfast.     No current facility-administered medications for this visit.    Allergies as of 12/16/2023 - Review Complete 12/16/2023  Allergen Reaction Noted   Gabapentin Other (See Comments) 04/29/2019   Morphine and codeine Other (See Comments) 03/31/2013   Celebrex [celecoxib] Other (See Comments) 06/18/2011   Lisinopril Cough 03/15/2015   Diazepam  Other (See Comments) 03/31/2013   Fenofibrate   05/15/2021   Guaifenesin & derivatives Other (See Comments) 09/26/2017   Lipitor [atorvastatin] Rash 04/29/2019   Sulfa antibiotics Rash 06/10/2011   Tape Other (See Comments) 01/17/2023    Family History  Problem Relation Age of Onset   Heart disease Father    Hypertension Father    Stroke Father    Heart attack Father    Cerebrovascular Accident Father    Diabetes Sister    Kidney disease Sister    Colon cancer Maternal Aunt    Breast cancer Maternal Aunt    Cancer Maternal Aunt 45       breast   Migraines Daughter    Colon polyps Neg Hx    Esophageal cancer Neg Hx    Rectal cancer Neg Hx    Stomach cancer Neg Hx     Social History   Socioeconomic History   Marital status: Widowed    Spouse name: Jaree Dwight   Number of  children: 1   Years of education: Not on file   Highest education level: 12th grade  Occupational History   Occupation: Disabled due to foot problems since 1993  Tobacco Use   Smoking status: Never   Smokeless tobacco: Never  Vaping Use   Vaping status: Never Used  Substance and Sexual Activity   Alcohol use: No   Drug use: No   Sexual activity: Not Currently    Partners: Male    Birth  control/protection: Surgical    Comment: hyst  Other Topics Concern   Not on file  Social History Narrative   Not on file   Social Drivers of Health   Financial Resource Strain: Patient Declined (02/27/2023)   Overall Financial Resource Strain (CARDIA)    Difficulty of Paying Living Expenses: Patient declined  Food Insecurity: No Food Insecurity (12/16/2023)   Hunger Vital Sign    Worried About Running Out of Food in the Last Year: Never true    Ran Out of Food in the Last Year: Never true  Transportation Needs: No Transportation Needs (12/16/2023)   PRAPARE - Administrator, Civil Service (Medical): No    Lack of Transportation (Non-Medical): No  Physical Activity: Sufficiently Active (12/16/2023)   Exercise Vital Sign    Days of Exercise per Week: 7 days    Minutes of Exercise per Session: 60 min  Stress: Stress Concern Present (12/16/2023)   [employer] of Occupational Health - Occupational Stress Questionnaire    Feeling of Stress: To some extent  Social Connections: Socially Isolated (12/16/2023)   Social Connection and Isolation Panel    Frequency of Communication with Friends and Family: More than three times a week    Frequency of Social Gatherings with Friends and Family: More than three times a week    Attends Religious Services: Never    Database Administrator or Organizations: No    Attends Banker Meetings: Never    Marital Status: Widowed  Intimate Partner Violence: Not At Risk (12/16/2023)   Humiliation, Afraid, Rape, and Kick questionnaire     Fear of Current or Ex-Partner: No    Emotionally Abused: No    Physically Abused: No    Sexually Abused: No    Review of Systems:    Constitutional: No weight loss, fever, chills, weakness or fatigue HEENT: Eyes: No change in vision               Ears, Nose, Throat:  No change in hearing or congestion Skin: No rash or itching Cardiovascular: No chest pain, chest pressure or palpitations   Respiratory: No SOB or cough Gastrointestinal: See HPI and otherwise negative Genitourinary: No dysuria or change in urinary frequency Neurological: No headache, dizziness or syncope Musculoskeletal: No new muscle or joint pain Hematologic: No bleeding or bruising Psychiatric: No history of depression or anxiety    Physical Exam:  Vital signs: BP 110/78   Pulse 84   Ht 5' 2 (1.575 m)   Wt 198 lb 4 oz (89.9 kg)   BMI 36.26 kg/m   Constitutional: NAD, alert and cooperative Head:  Normocephalic and atraumatic. Eyes:   PEERL, EOMI. No icterus. Conjunctiva pink. Respiratory: Respirations even and unlabored. Lungs clear to auscultation bilaterally.   No wheezes, crackles, or rhonchi.  Cardiovascular:  Regular rate and rhythm. No peripheral edema, cyanosis or pallor.  Gastrointestinal:  Soft, nondistended, nontender. No rebound or guarding. Normal bowel sounds. No appreciable masses or hepatomegaly. Rectal:  Declines Msk:  Symmetrical without gross deformities. Without edema, no deformity or joint abnormality.  Neurologic:  Alert and  oriented x4;  grossly normal neurologically.  Skin:   Dry and intact without significant lesions or rashes. Psychiatric: Oriented to person, place and time. Demonstrates good judgement and reason without abnormal affect or behaviors.   RELEVANT LABS AND IMAGING: CBC    Component Value Date/Time   WBC 8.0 10/31/2023 1110   WBC 18.5 (H) 07/23/2005 1249   RBC  4.67 10/31/2023 1110   RBC 4.6 09/12/2020 0000   HGB 14.4 10/31/2023 1110   HGB 14.6 07/23/2005  1249   HCT 44.7 10/31/2023 1110   HCT 42.5 07/23/2005 1249   PLT 323 10/31/2023 1110   MCV 96 10/31/2023 1110   MCV 92.5 07/23/2005 1249   MCH 30.8 10/31/2023 1110   MCH 31.9 07/23/2005 1249   MCHC 32.2 10/31/2023 1110   MCHC 34.5 07/23/2005 1249   RDW 13.8 10/31/2023 1110   RDW 13.4 07/23/2005 1249   LYMPHSABS 2.8 10/31/2023 1110   LYMPHSABS 1.3 07/23/2005 1249   MONOABS 0.5 07/23/2005 1249   EOSABS 0.1 10/31/2023 1110   BASOSABS 0.1 10/31/2023 1110   BASOSABS 0.0 07/23/2005 1249    CMP     Component Value Date/Time   NA 143 10/31/2023 1110   K 3.8 10/31/2023 1110   CL 105 10/31/2023 1110   CO2 25 10/31/2023 1110   GLUCOSE 85 10/31/2023 1110   GLUCOSE 91 12/26/2011 1349   BUN 16 10/31/2023 1110   CREATININE 0.88 10/31/2023 1110   CALCIUM 9.0 10/31/2023 1110   PROT 5.8 (L) 10/31/2023 1110   ALBUMIN 3.8 (L) 10/31/2023 1110   AST 18 10/31/2023 1110   ALT 13 10/31/2023 1110   ALKPHOS 71 10/31/2023 1110   BILITOT 0.8 10/31/2023 1110   GFRNONAA 73 10/29/2019 0850   GFRAA 84 10/29/2019 0850     Assessment/Plan:   IBS-C Longstanding history of chronic constipation in which she has failed fiber supplement and MiraLAX.  Colonoscopy 2024 with 8 tubular adenomas and 3 SSAs and a recall of 3 years.  Associated LLQ pain which improves with bowel movement.  Occasional straining and normal TSH.  Improved on Linzess  145 mcg though now starting to be too strong for her. - Trial of Linzess  72 mcg, samples provided.  Patient will call me let me know if these are working well for her. - Increase water, increase fiber, increase exercise - Consider pelvic floor physical therapy for pelvic floor dyssynergia - Follow-up with me in 8 weeks  Dysphagia GERD Patient reports longstanding history of GERD well-controlled on Nexium 40 mg twice daily.  She states prior history of EGD many years ago with Dr. Towana which was negative.  She has tried to wean off of Nexium and cannot do so without  rebound symptoms and would prefer to stay on twice daily dosing.  She is aware of PPI risks.  She also reports previous dilations and history of dysphagia currently not having the symptoms at this time. - Well-controlled on PPI twice daily, advised to PPI risks.  Recent CMP unrevealing - If dysphagia were to recur please let us  know and we can get a sitter EGD with dilation for further evaluation - Follow-up with me in 8 weeks - Educated patient on lifestyle modifications and provided patient education handout   History of colon polyps 11 precancerous polyps on colonoscopy 12/2022 (8 tubular adenomas and 3 sessile serrated polyps).  Patient declines offer for genetic testing. -Due for repeat 2027     Nestor Blower, PA-C [facility] 12/16/2023, 3:38 PM  Cc: Sherre Clapper, MD

## 2023-12-16 NOTE — Progress Notes (Unsigned)
 Chief Complaint  Patient presents with   Medicare Wellness    AWV     Subjective:   Andrea White is a 68 y.o. female who presents for a Medicare Annual Wellness Visit.  Allergies (verified) Gabapentin, Morphine and codeine, Celebrex [celecoxib], Lisinopril, Diazepam , Fenofibrate , Guaifenesin & derivatives, Lipitor [atorvastatin], Sulfa antibiotics, and Tape   History: Past Medical History:  Diagnosis Date   Allergy    Anxiety    Arthritis    Bradycardia, unspecified    Bruises easily    Calf pain    when walking   Fibromyalgia    Frequent headaches    GERD (gastroesophageal reflux disease)    H/O bladder problems    Hypertension    IBS (irritable bowel syndrome)    Localized edema    Mild persistent asthma, uncomplicated    Morbid (severe) obesity due to excess calories (HCC)    Muscle pain    Other hypotension    Ovarian cyst    Slow transit constipation    Solitary pulmonary nodule    Swelling    feet and legs   Type 2 diabetes mellitus with diabetic nephropathy (HCC)    Varicose veins of bilateral lower extremities with pain    Past Surgical History:  Procedure Laterality Date   BUNIONECTOMY     left foot 5th toe   CARDIAC CATHETERIZATION     carpel tunnel surgery     CESAREAN SECTION     CHOLECYSTECTOMY     GALLBLADDER SURGERY     TONSILLECTOMY     TUBAL LIGATION     VAGINAL HYSTERECTOMY  2003   LAVH/SR   Family History  Problem Relation Age of Onset   Heart disease Father    Hypertension Father    Stroke Father    Heart attack Father    Cerebrovascular Accident Father    Diabetes Sister    Kidney disease Sister    Colon cancer Maternal Aunt    Breast cancer Maternal Aunt    Cancer Maternal Aunt 8       breast   Migraines Daughter    Colon polyps Neg Hx    Esophageal cancer Neg Hx    Rectal cancer Neg Hx    Stomach cancer Neg Hx    Social History   Occupational History   Occupation: Disabled due to foot problems since 1993   Tobacco Use   Smoking status: Never   Smokeless tobacco: Never  Vaping Use   Vaping status: Never Used  Substance and Sexual Activity   Alcohol use: No   Drug use: No   Sexual activity: Not Currently    Partners: Male    Birth control/protection: Surgical    Comment: hyst   Tobacco Counseling Counseling given: Not Answered  SDOH Screenings   Food Insecurity: No Food Insecurity (12/16/2023)  Housing: Low Risk  (12/16/2023)  Transportation Needs: No Transportation Needs (12/16/2023)  Utilities: Not At Risk (12/16/2023)  Alcohol Screen: Low Risk  (06/05/2022)  Depression (PHQ2-9): Low Risk  (12/16/2023)  Financial Resource Strain: Patient Declined (02/27/2023)  Physical Activity: Sufficiently Active (12/16/2023)  Social Connections: Socially Isolated (12/16/2023)  Stress: Stress Concern Present (12/16/2023)  Tobacco Use: Low Risk  (12/16/2023)  Health Literacy: Adequate Health Literacy (12/16/2023)   See flowsheets for full screening details  Depression Screen PHQ 2 & 9 Depression Scale- Over the past 2 weeks, how often have you been bothered by any of the following problems? Little interest or pleasure in  doing things: 0 Feeling down, depressed, or hopeless (PHQ Adolescent also includes...irritable): 0 PHQ-2 Total Score: 0 Trouble falling or staying asleep, or sleeping too much: 0 Feeling tired or having little energy: 0 Poor appetite or overeating (PHQ Adolescent also includes...weight loss): 0 Feeling bad about yourself - or that you are a failure or have let yourself or your family down: 0 Trouble concentrating on things, such as reading the newspaper or watching television (PHQ Adolescent also includes...like school work): 0 Moving or speaking so slowly that other people could have noticed. Or the opposite - being so fidgety or restless that you have been moving around a lot more than usual: 0 Thoughts that you would be better off dead, or of hurting yourself in some way:  0 PHQ-9 Total Score: 0 If you checked off any problems, how difficult have these problems made it for you to do your work, take care of things at home, or get along with other people?: Not difficult at all  Depression Treatment Depression Interventions/Treatment : Medication; Counseling     Goals Addressed   None    Visit info / Clinical Intake: Medicare Wellness Visit Type:: Subsequent Annual Wellness Visit Persons participating in visit:: patient Medicare Wellness Visit Mode:: In-person (required for WTM) Information given by:: patient Interpreter Needed?: No Pre-visit prep was completed: yes AWV questionnaire completed by patient prior to visit?: no Living arrangements:: (!) lives alone Patient's Overall Health Status Rating: very good Typical amount of pain: some Does pain affect daily life?: no Are you currently prescribed opioids?: no  Dietary Habits and Nutritional Risks How many meals a day?: 2 Eats fruit and vegetables daily?: yes Most meals are obtained by: preparing own meals; having others provide food In the last 2 weeks, have you had any of the following?: none Diabetic:: (!) yes Any non-healing wounds?: (!) yes How often do you check your BS?: 0 Would you like to be referred to a Nutritionist or for Diabetic Management? : no  Functional Status Activities of Daily Living (to include ambulation/medication): Independent Ambulation: Independent Medication Administration: Independent Home Management: Independent Manage your own finances?: yes Primary transportation is: driving Concerns about vision?: no *vision screening is required for WTM* Concerns about hearing?: no  Fall Screening Falls in the past year?: 0 Number of falls in past year: 0 Was there an injury with Fall?: 0 Fall Risk Category Calculator: 0 Patient Fall Risk Level: Low Fall Risk  Fall Risk Patient at Risk for Falls Due to: History of fall(s) Fall risk Follow up: Falls evaluation  completed  Home and Transportation Safety: All rugs have non-skid backing?: yes All stairs or steps have railings?: yes Grab bars in the bathtub or shower?: yes Have non-skid surface in bathtub or shower?: yes Good home lighting?: yes Regular seat belt use?: yes Hospital stays in the last year:: no  Cognitive Assessment Difficulty concentrating, remembering, or making decisions? : no Will 6CIT or Mini Cog be Completed: yes What year is it?: 0 points What month is it?: 0 points Give patient an address phrase to remember (5 components): [place] jill [place] About what time is it?: 0 points Count backwards from 20 to 1: 0 points Say the months of the year in reverse: 0 points Repeat the address phrase from earlier: 0 points 6 CIT Score: 0 points  Advance Directives (For Healthcare) Does Patient Have a Medical Advance Directive?: Yes Type of Advance Directive: Healthcare Power of [place]; Living will  Objective:    Today's Vitals   12/16/23 1056  BP: 128/82  Pulse: 67  Resp: 16  Temp: 97.8 F (36.6 C)  TempSrc: Temporal  SpO2: 97%  Weight: 196 lb 10.4 oz (89.2 kg)  Height: 5' 2 (1.575 m)  PainSc: 0-No pain   Body mass index is 35.97 kg/m.  Current Medications (verified) Outpatient Encounter Medications as of 12/16/2023  Medication Sig   Biotin 89999 MCG TABS Take 1 tablet by mouth daily.   DULoxetine  (CYMBALTA ) 60 MG capsule Take 1 capsule (60 mg total) by mouth daily.   empagliflozin  (JARDIANCE ) 10 MG TABS tablet Take 1 tablet (10 mg total) by mouth daily before breakfast.   LINZESS  145 MCG CAPS capsule TAKE 1 CAPSULE DAILY BEFOREBREAKFAST   loratadine  (CLARITIN ) 10 MG tablet Take 1 tablet (10 mg total) by mouth daily.   meclizine  (ANTIVERT ) 25 MG tablet TAKE 1 TABLET TWICE A DAY   Multiple Vitamin (MULTIVITAMIN) tablet Take 1 tablet by mouth daily.   nystatin  (MYCOSTATIN ) 100000 UNIT/ML suspension SWISH AND SPIT BY MOUTH4 TIMES A DAY AS  NEEDED      (THRUSH)   omeprazole  (PRILOSEC) 40 MG capsule Take 1 capsule (40 mg total) by mouth 2 (two) times daily.   ondansetron  (ZOFRAN -ODT) 4 MG disintegrating tablet Take 1 tablet (4 mg total) by mouth every 8 (eight) hours as needed for nausea or vomiting.   pravastatin  (PRAVACHOL ) 40 MG tablet TAKE 1 TABLET DAILY   traMADol  (ULTRAM ) 50 MG tablet Take 1 tablet (50 mg total) by mouth 3 (three) times daily as needed.   valACYclovir  (VALTREX ) 1000 MG tablet Take 2 tablets (2,000 mg total) by mouth 2 (two) times daily as needed (x 1 day for each break out).   valsartan  (DIOVAN ) 40 MG tablet Take 1 tablet (40 mg total) by mouth daily.   [DISCONTINUED] ALPRAZolam  (XANAX ) 0.5 MG tablet TAKE 1/2 TO 1 TABLET by mouth EVERY 8 HOURS AS NEEDED   No facility-administered encounter medications on file as of 12/16/2023.   Hearing/Vision screen No results found. Immunizations and Health Maintenance Health Maintenance  Topic Date Due   Zoster Vaccines- Shingrix (1 of 2) Never done   COVID-19 Vaccine (3 - Moderna risk series) 01/01/2024 (Originally 09/20/2019)   Diabetic kidney evaluation - Urine ACR  02/28/2024   OPHTHALMOLOGY EXAM  03/05/2024   HEMOGLOBIN A1C  06/14/2024   Diabetic kidney evaluation - eGFR measurement  10/30/2024   FOOT EXAM  12/15/2024   Medicare Annual Wellness (AWV)  12/15/2024   Bone Density Scan  05/05/2025   Mammogram  10/07/2025   Colonoscopy  01/16/2026   DTaP/Tdap/Td (3 - Td or Tdap) 11/21/2031   Pneumococcal Vaccine: 50+ Years  Completed   Influenza Vaccine  Completed   Hepatitis C Screening  Completed   Meningococcal B Vaccine  Aged Out        Assessment/Plan:  This is a routine wellness examination for [place].  Patient Care Team: Sherre Clapper, MD as PCP - General (Family Medicine) Larnell Purchase, MD as Referring Physician (Orthopedic Surgery) Burt Fus, DPM as Consulting Physician (Podiatry) Darcel Pool, MD as Consulting Physician (Obstetrics and  Gynecology) Erasmo Bernardino BRAVO, OD (Optometry) Gust Royden ORN, MD (Orthopedic Surgery) Roney, Jenna, OD as Referring Physician (Optometry)  I have personally reviewed and noted the following in the patient's chart:   Medical and social history Use of alcohol, tobacco or illicit drugs  Current medications and supplements including opioid prescriptions. Functional ability and status Nutritional status  Physical activity Advanced directives List of other physicians Hospitalizations, surgeries, and ER visits in previous 12 months Vitals Screenings to include cognitive, depression, and falls Referrals and appointments  Orders Placed This Encounter  Procedures   Flu vaccine HIGH DOSE PF(Fluzone Trivalent)   In addition, I have reviewed and discussed with patient certain preventive protocols, quality metrics, and best practice recommendations. A written personalized care plan for preventive services as well as general preventive health recommendations were provided to patient.   Jon Birmingham, CMA   12/16/2023   Return in 1 year (on 12/15/2024).  After Visit Summary: {CHL AMB AWV After Visit Summary:[phone]}  Nurse Notes: ***

## 2023-12-19 DIAGNOSIS — Z0000 Encounter for general adult medical examination without abnormal findings: Secondary | ICD-10-CM | POA: Insufficient documentation

## 2023-12-19 DIAGNOSIS — G44209 Tension-type headache, unspecified, not intractable: Secondary | ICD-10-CM | POA: Insufficient documentation

## 2023-12-19 DIAGNOSIS — K5904 Chronic idiopathic constipation: Secondary | ICD-10-CM | POA: Insufficient documentation

## 2023-12-19 DIAGNOSIS — S51801A Unspecified open wound of right forearm, initial encounter: Secondary | ICD-10-CM | POA: Insufficient documentation

## 2023-12-19 NOTE — Assessment & Plan Note (Signed)
 Annual wellness visit conducted. Flu shot administered.

## 2023-12-19 NOTE — Assessment & Plan Note (Signed)
 Constipation improved with Linzess . Bowel movements regular and soft. - Continue Linzess  145 mcg as prescribed.

## 2023-12-19 NOTE — Assessment & Plan Note (Signed)
 Recent wheezing noted, possibly related to bronchitis.

## 2023-12-19 NOTE — Assessment & Plan Note (Signed)
 Multiple cuts and bruises from weed eating incident. One cut healing. Tetanus vaccination up to date. - Ensure tetanus vaccination remains up to date.

## 2023-12-19 NOTE — Assessment & Plan Note (Signed)
 Tension headaches likely related to stress from yard work. No significant anxiety or depression. - Continue current management and monitor symptoms.

## 2023-12-22 DIAGNOSIS — I129 Hypertensive chronic kidney disease with stage 1 through stage 4 chronic kidney disease, or unspecified chronic kidney disease: Secondary | ICD-10-CM | POA: Diagnosis not present

## 2023-12-22 DIAGNOSIS — E1122 Type 2 diabetes mellitus with diabetic chronic kidney disease: Secondary | ICD-10-CM | POA: Diagnosis not present

## 2023-12-22 DIAGNOSIS — N1831 Chronic kidney disease, stage 3a: Secondary | ICD-10-CM | POA: Diagnosis not present

## 2024-01-04 ENCOUNTER — Other Ambulatory Visit: Payer: Self-pay | Admitting: Family Medicine

## 2024-01-04 DIAGNOSIS — I5041 Acute combined systolic (congestive) and diastolic (congestive) heart failure: Secondary | ICD-10-CM

## 2024-01-06 ENCOUNTER — Encounter: Payer: Self-pay | Admitting: Family Medicine

## 2024-01-06 ENCOUNTER — Ambulatory Visit: Admitting: Family Medicine

## 2024-01-06 ENCOUNTER — Telehealth: Payer: Self-pay

## 2024-01-06 VITALS — BP 138/82 | HR 65 | Temp 97.8°F | Resp 16 | Ht 62.0 in | Wt 200.8 lb

## 2024-01-06 DIAGNOSIS — J4540 Moderate persistent asthma, uncomplicated: Secondary | ICD-10-CM

## 2024-01-06 DIAGNOSIS — G4489 Other headache syndrome: Secondary | ICD-10-CM | POA: Diagnosis not present

## 2024-01-06 DIAGNOSIS — I130 Hypertensive heart and chronic kidney disease with heart failure and stage 1 through stage 4 chronic kidney disease, or unspecified chronic kidney disease: Secondary | ICD-10-CM

## 2024-01-06 DIAGNOSIS — I5041 Acute combined systolic (congestive) and diastolic (congestive) heart failure: Secondary | ICD-10-CM | POA: Diagnosis not present

## 2024-01-06 DIAGNOSIS — N1832 Chronic kidney disease, stage 3b: Secondary | ICD-10-CM | POA: Diagnosis not present

## 2024-01-06 MED ORDER — LORATADINE 10 MG PO TABS: 10.0000 mg | ORAL_TABLET | Freq: Every day | ORAL | 3 refills | Status: AC

## 2024-01-06 MED ORDER — VALSARTAN 80 MG PO TABS: 80.0000 mg | ORAL_TABLET | Freq: Every day | ORAL | 0 refills | Status: DC

## 2024-01-06 NOTE — Telephone Encounter (Signed)
 Copied from CRM [id number]. Topic: Clinical - Red Word Triage >> Jan 05, 2024 11:42 AM Gustabo D wrote: Pt says her BP has been high and having headaches.  12-23-23 139/89, 12-25-23- 150/95 >> Jan 05, 2024  5:13 PM Nurse Lauraine BROCKS, RN wrote: Pt declines NT, OV tomorrow >> Jan 05, 2024 12:01 PM Gustabo D wrote: Pt hung up during hold. I called he back she declined speaking with a nurse she said she made a appt to see her pcp tomorrow

## 2024-01-06 NOTE — Patient Instructions (Signed)
°  VISIT SUMMARY: Today, we focused on managing your blood pressure and discussed some of the symptoms you've been experiencing, including mild headaches and a sensation in your chest.  YOUR PLAN: HYPERTENSION: Your blood pressure was elevated at 146/90 mmHg. We discussed that your headaches might be related to your blood pressure and physical activity. -Increase your valsartan  dose to 80 mg daily. -A prescription for a 90-day supply of valsartan  has been sent to [facility]. -Please keep a log of your blood pressure readings and bring it to your next appointment in two weeks.  CEPHALALGIA (HEADACHES): You have been experiencing mild headaches, often in the mornings or late evenings, especially after physical activity. -Monitor your headaches and note any patterns or triggers. -Inform us  if the headaches worsen or become more frequent.  CHEST SENSATION: You described a sensation of an 'air pocket' in your chest that comes with tightness and then releases. This is not associated with burping or lung issues. -Monitor the chest sensation and note when it occurs and any activities that might trigger it. -Inform us  if the sensation becomes more frequent or changes in nature.                      Contains text generated by Abridge.                                 Contains text generated by Abridge.

## 2024-01-06 NOTE — Progress Notes (Unsigned)
 Z      Subjective:  Patient ID: Andrea White, female    DOB: [birth year]  Age: 68 y.o. MRN: [id number]  Chief Complaint  Patient presents with   Hypertension    HPI: Discussed the use of AI scribe software for clinical note transcription with the patient, who gave verbal consent to proceed.  History of Present Illness Andrea White is a 68 year old female with hypertension who presents for blood pressure management.  Hypertension - Blood pressure measured at 146/90 mmHg two weeks ago, now 138/82. - Previously discontinued spironolactone  HCTZ due to kidney concerns - Currently taking valsartan  40 mg daily  Cephalalgia - Mild headaches described as 'little tiny headache kind things' - Occurs primarily in the mornings or late evenings - Frequently follows physical activity such as yard work  Chest sensation - Sensation of an 'air pocket' in the middle of the chest with tightness that subsequently releases - Not associated with burping or pulmonary symptoms - No chest pain  Respiratory history and exposures - Not using albuterol  inhaler - Uncertain history of asthma or COPD - Exposure to secondhand smoke from husband who smokes       12/16/2023   10:47 AM 08/27/2023   11:17 AM 05/26/2023   10:43 AM 02/28/2023    8:50 AM 11/26/2022   10:06 AM  Depression screen PHQ 2/9  Decreased Interest 0 0 0 1   Down, Depressed, Hopeless 0 0 0 2 0  PHQ - 2 Score 0 0 0 3 0  Altered sleeping   0 3 0  Tired, decreased energy   0 1 0  Change in appetite   0 2 1  Feeling bad or failure about yourself    0 0 0  Trouble concentrating   0 0 0  Moving slowly or fidgety/restless   0 1 0  Suicidal thoughts   0 0 0  PHQ-9 Score   0  10  1   Difficult doing work/chores   Not difficult at all Somewhat difficult Not difficult at all     Data saved with a previous flowsheet row definition        12/16/2023   10:40 AM  Fall Risk   Falls in the past year? 0  Number falls in past yr: 0   Injury with Fall? 0   Risk for fall due to : History of fall(s)  Follow up Falls evaluation completed     Data saved with a previous flowsheet row definition    Patient Care Team: Sherre Clapper, MD as PCP - General (Family Medicine) Larnell Purchase, MD as Referring Physician (Orthopedic Surgery) Burt Fus, DPM as Consulting Physician (Podiatry) Darcel Pool, MD as Consulting Physician (Obstetrics and Gynecology) Erasmo Bernardino BRAVO, OD (Optometry) Gust Royden ORN, MD (Orthopedic Surgery) Roney, Jenna, OD as Referring Physician (Optometry)   Review of Systems  Constitutional:  Negative for chills, fatigue and fever.  HENT:  Negative for congestion, ear pain and sore throat.   Respiratory:  Negative for cough and shortness of breath.   Cardiovascular:  Negative for chest pain.  Gastrointestinal:  Negative for abdominal pain, constipation, diarrhea, nausea and vomiting.  Genitourinary:  Negative for dysuria and urgency.  Musculoskeletal:  Negative for arthralgias and myalgias.  Skin:  Negative for rash.  Neurological:  Positive for headaches. Negative for dizziness.  Psychiatric/Behavioral:  Negative for dysphoric mood. The patient is not nervous/anxious.     Medications Ordered Prior to Encounter[1] Past Medical History:  Diagnosis Date   Allergy    Anxiety    Arthritis    Bradycardia, unspecified    Bruises easily    Calf pain    when walking   Fibromyalgia    Frequent headaches    GERD (gastroesophageal reflux disease)    H/O bladder problems    Hypertension    IBS (irritable bowel syndrome)    Localized edema    Mild persistent asthma, uncomplicated    Morbid (severe) obesity due to excess calories (HCC)    Muscle pain    Other hypotension    Ovarian cyst    Slow transit constipation    Solitary pulmonary nodule    Swelling    feet and legs   Type 2 diabetes mellitus with diabetic nephropathy (HCC)    Varicose veins of bilateral lower extremities with pain     Past Surgical History:  Procedure Laterality Date   BUNIONECTOMY     left foot 5th toe   CARDIAC CATHETERIZATION     carpel tunnel surgery     CESAREAN SECTION     CHOLECYSTECTOMY     GALLBLADDER SURGERY     TONSILLECTOMY     TUBAL LIGATION     VAGINAL HYSTERECTOMY  2003   LAVH/SR    Family History  Problem Relation Age of Onset   Heart disease Father    Hypertension Father    Stroke Father    Heart attack Father    Cerebrovascular Accident Father    Diabetes Sister    Kidney disease Sister    Colon cancer Maternal Aunt    Breast cancer Maternal Aunt    Cancer Maternal Aunt 17       breast   Migraines Daughter    Colon polyps Neg Hx    Esophageal cancer Neg Hx    Rectal cancer Neg Hx    Stomach cancer Neg Hx    Social History   Socioeconomic History   Marital status: Widowed    Spouse name: Venisa Frampton   Number of children: 1   Years of education: Not on file   Highest education level: 12th grade  Occupational History   Occupation: Disabled due to foot problems since 1993  Tobacco Use   Smoking status: Never   Smokeless tobacco: Never  Vaping Use   Vaping status: Never Used  Substance and Sexual Activity   Alcohol use: No   Drug use: No   Sexual activity: Not Currently    Partners: Male    Birth control/protection: Surgical    Comment: hyst  Other Topics Concern   Not on file  Social History Narrative   Not on file   Social Drivers of Health   Tobacco Use: Low Risk (01/06/2024)   Patient History    Smoking Tobacco Use: Never    Smokeless Tobacco Use: Never    Passive Exposure: Not on file  Financial Resource Strain: Medium Risk (01/05/2024)   Overall Financial Resource Strain (CARDIA)    Difficulty of Paying Living Expenses: Somewhat hard  Food Insecurity: Food Insecurity Present (01/05/2024)   Epic    Worried About Programme Researcher, Broadcasting/film/video in the Last Year: Sometimes true    Ran Out of Food in the Last Year: Never true  Transportation Needs:  No Transportation Needs (01/05/2024)   Epic    Lack of Transportation (Medical): No    Lack of Transportation (Non-Medical): No  Physical Activity: Sufficiently Active (01/05/2024)   Exercise Vital Sign  Days of Exercise per Week: 7 days    Minutes of Exercise per Session: 30 min  Stress: Stress Concern Present (01/05/2024)   [employer] of Occupational Health - Occupational Stress Questionnaire    Feeling of Stress: To some extent  Social Connections: Socially Isolated (01/05/2024)   Social Connection and Isolation Panel    Frequency of Communication with Friends and Family: More than three times a week    Frequency of Social Gatherings with Friends and Family: Three times a week    Attends Religious Services: Never    Active Member of Clubs or Organizations: No    Attends Banker Meetings: Not on file    Marital Status: Widowed  Depression (PHQ2-9): Low Risk (12/16/2023)   Depression (PHQ2-9)    PHQ-2 Score: 0  Alcohol Screen: Low Risk (06/05/2022)   Alcohol Screen    Last Alcohol Screening Score (AUDIT): 0  Housing: Low Risk (01/05/2024)   Epic    Unable to Pay for Housing in the Last Year: No    Number of Times Moved in the Last Year: 0    Homeless in the Last Year: No  Utilities: Not At Risk (12/16/2023)   Epic    Threatened with loss of utilities: No  Health Literacy: Adequate Health Literacy (12/16/2023)   B1300 Health Literacy    Frequency of need for help with medical instructions: Never    Objective:  BP 138/82   Pulse 65   Temp 97.8 F (36.6 C) (Temporal)   Resp 16   Ht 5' 2 (1.575 m)   Wt 200 lb 12.8 oz (91.1 kg)   SpO2 97%   BMI 36.73 kg/m      01/06/2024   10:49 AM 12/16/2023    2:48 PM 12/16/2023   10:56 AM  BP/Weight  Systolic BP 138 110 128  Diastolic BP 82 78 82  Wt. (Lbs) 200.8 198.25 196.65  BMI 36.73 kg/m2 36.26 kg/m2 35.97 kg/m2    Physical Exam Vitals reviewed.  Constitutional:      Appearance: Normal  appearance.  HENT:     Right Ear: Tympanic membrane, ear canal and external ear normal.     Left Ear: Tympanic membrane, ear canal and external ear normal.     Nose: Nose normal.     Mouth/Throat:     Pharynx: Oropharynx is clear.  Cardiovascular:     Rate and Rhythm: Normal rate and regular rhythm.     Heart sounds: Normal heart sounds. No murmur heard. Pulmonary:     Effort: Pulmonary effort is normal. No respiratory distress.     Breath sounds: Normal breath sounds.  Lymphadenopathy:     Cervical: No cervical adenopathy.  Neurological:     Mental Status: She is alert and oriented to person, place, and time.  Psychiatric:        Mood and Affect: Mood normal.        Behavior: Behavior normal.         Lab Results  Component Value Date   WBC 8.0 10/31/2023   HGB 14.4 10/31/2023   HCT 44.7 10/31/2023   PLT 323 10/31/2023   GLUCOSE 85 10/31/2023   CHOL 198 05/26/2023   TRIG 115 05/26/2023   HDL 68 05/26/2023   LDLCALC 110 (H) 05/26/2023   ALT 13 10/31/2023   AST 18 10/31/2023   NA 143 10/31/2023   K 3.8 10/31/2023   CL 105 10/31/2023   CREATININE 0.88 10/31/2023   BUN 16 10/31/2023  CO2 25 10/31/2023   TSH 0.478 10/31/2023   HGBA1C 6.3 12/16/2023    Results for orders placed or performed in visit on 12/16/23  POCT glycosylated hemoglobin (Hb A1C)   Collection Time: 12/16/23 10:34 AM  Result Value Ref Range   Hemoglobin A1C     HbA1c POC (<> result, manual entry) 6.3 4.0 - 5.6 %   HbA1c, POC (prediabetic range)     HbA1c, POC (controlled diabetic range)    POCT Lipid Panel   Collection Time: 12/16/23 10:35 AM  Result Value Ref Range   TC 147    HDL 59    TRG 139    LDL 60    Non-HDL 88    TC/HDL 1.0   .  Assessment & Plan:   Assessment & Plan Moderate persistent asthma without complication I have sent a message to see if she is using the trelegy inhaler and if so, is it helping.  Will send refill if needed.  Orders:   loratadine  (CLARITIN ) 10 MG  tablet; Take 1 tablet (10 mg total) by mouth daily.  Hypertensive heart and kidney disease with acute combined systolic and diastolic congestive heart failure and stage 3b chronic kidney disease (HCC) Not quite at goal. Headaches possibly linked to hypertension and physical activity.  Stress and holiday activities may contribute to elevated readings. - Increased valsartan  to 80 mg daily. - Sent prescription for 90-day supply of valsartan . - Requested blood pressure log in two weeks.  Orders:   valsartan  (DIOVAN ) 80 MG tablet; Take 1 tablet (80 mg total) by mouth daily.  Other headache syndrome You have been experiencing mild headaches, often in the mornings or late evenings, especially after physical activity. -Monitor your headaches and note any patterns or triggers. -Inform us  if the headaches worsen or become more frequent.      Body mass index is 36.73 kg/m.    Meds ordered this encounter  Medications   valsartan  (DIOVAN ) 80 MG tablet    Sig: Take 1 tablet (80 mg total) by mouth daily.    Dispense:  90 tablet    Refill:  0   loratadine  (CLARITIN ) 10 MG tablet    Sig: Take 1 tablet (10 mg total) by mouth daily.    Dispense:  90 tablet    Refill:  3    No orders of the defined types were placed in this encounter.      Follow-up: No follow-ups on file.  An After Visit Summary was printed and given to the patient.  Abigail Free, MD [facility] [phone]     [1]  Current Outpatient Medications on File Prior to Visit  Medication Sig Dispense Refill   ALPRAZolam  (XANAX ) 0.5 MG tablet TAKE 1/2 TO 1 TABLET by mouth EVERY 8 HOURS AS NEEDED 30 tablet 5   Biotin 89999 MCG TABS Take 1 tablet by mouth daily.     DULoxetine  (CYMBALTA ) 60 MG capsule Take 1 capsule (60 mg total) by mouth daily. 90 capsule 1   empagliflozin  (JARDIANCE ) 10 MG TABS tablet Take 1 tablet (10 mg total) by mouth daily before breakfast. 90 tablet 1   linaclotide  (LINZESS ) 72 MCG  capsule Take 1 capsule (72 mcg total) by mouth daily before breakfast.     meclizine  (ANTIVERT ) 25 MG tablet TAKE 1 TABLET TWICE A DAY 180 tablet 1   Multiple Vitamin (MULTIVITAMIN) tablet Take 1 tablet by mouth daily.     nystatin  (MYCOSTATIN ) 100000 UNIT/ML suspension SWISH AND SPIT  BY MOUTH4 TIMES A DAY AS NEEDED      (THRUSH) 120 mL 2   omeprazole  (PRILOSEC) 40 MG capsule Take 1 capsule (40 mg total) by mouth 2 (two) times daily. 180 capsule 1   ondansetron  (ZOFRAN -ODT) 4 MG disintegrating tablet Take 1 tablet (4 mg total) by mouth every 8 (eight) hours as needed for nausea or vomiting. 30 tablet 0   pravastatin  (PRAVACHOL ) 40 MG tablet TAKE 1 TABLET DAILY 90 tablet 1   traMADol  (ULTRAM ) 50 MG tablet Take 1 tablet (50 mg total) by mouth 3 (three) times daily as needed. 90 tablet 1   valACYclovir  (VALTREX ) 1000 MG tablet Take 2 tablets (2,000 mg total) by mouth 2 (two) times daily as needed (x 1 day for each break out). 20 tablet 0   No current facility-administered medications on file prior to visit.

## 2024-01-11 ENCOUNTER — Encounter: Payer: Self-pay | Admitting: Family Medicine

## 2024-01-11 DIAGNOSIS — G4489 Other headache syndrome: Secondary | ICD-10-CM | POA: Insufficient documentation

## 2024-01-11 NOTE — Assessment & Plan Note (Signed)
 Not quite at goal. Headaches possibly linked to hypertension and physical activity.  Stress and holiday activities may contribute to elevated readings. - Increased valsartan  to 80 mg daily. - Sent prescription for 90-day supply of valsartan . - Requested blood pressure log in two weeks.  Orders:   valsartan  (DIOVAN ) 80 MG tablet; Take 1 tablet (80 mg total) by mouth daily.

## 2024-01-11 NOTE — Assessment & Plan Note (Signed)
 You have been experiencing mild headaches, often in the mornings or late evenings, especially after physical activity. -Monitor your headaches and note any patterns or triggers. -Inform us  if the headaches worsen or become more frequent.

## 2024-01-11 NOTE — Assessment & Plan Note (Signed)
 I have sent a message to see if she is using the trelegy inhaler and if so, is it helping.  Will send refill if needed.  Orders:   loratadine  (CLARITIN ) 10 MG tablet; Take 1 tablet (10 mg total) by mouth daily.

## 2024-01-25 MED ORDER — TRELEGY ELLIPTA 100-62.5-25 MCG/ACT IN AEPB: 1.0000 | INHALATION_SPRAY | Freq: Every day | RESPIRATORY_TRACT | 3 refills | Status: AC

## 2024-02-04 ENCOUNTER — Other Ambulatory Visit: Payer: Self-pay | Admitting: Family Medicine

## 2024-02-06 ENCOUNTER — Ambulatory Visit: Payer: Self-pay
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
## 2024-02-09 ENCOUNTER — Ambulatory Visit: Payer: Self-pay | Admitting: Family Medicine

## 2024-02-09 ENCOUNTER — Encounter: Payer: Self-pay | Admitting: Family Medicine

## 2024-02-09 VITALS — BP 130/74 | HR 86 | Temp 98.4°F | Ht 62.0 in | Wt 195.8 lb

## 2024-02-09 DIAGNOSIS — M546 Pain in thoracic spine: Secondary | ICD-10-CM

## 2024-02-09 DIAGNOSIS — I10 Essential (primary) hypertension: Secondary | ICD-10-CM

## 2024-02-09 MED ORDER — VALSARTAN 160 MG PO TABS: 160.0000 mg | ORAL_TABLET | Freq: Every day | ORAL | 1 refills | Status: AC

## 2024-02-09 NOTE — Progress Notes (Signed)
 "  Subjective:  Patient ID: Andrea White, female    DOB: [birth year]  Age: 69 y.o. MRN: [id number]  Chief Complaint  Patient presents with   Hospitalization Follow-up    HPI: Discussed the use of AI scribe software for clinical note transcription with the patient, who gave verbal consent to proceed.  History of Present Illness Andrea White is a 69 year old female who presents with pain and shortness of breath following a fall.  Musculoskeletal pain following trauma - Fall occurred approximately one week ago, resulting in pain localized to the ribs, neck, and tailbone. - Pain is exacerbated by deep breaths, coughing, or hiccups. - No evidence of rib fracture or internal bleeding per emergency department evaluation. - Currently using tramadol  and methocarbamol for pain management; prefers tramadol  due to better pain relief. - Methocarbamol causes nausea and a sensation of feeling 'funky'. - Considering over-the-counter ibuprofen in smaller doses for pain control. - Prescribed ibuprofen and Robaxin, but tramadol  is preferred.  Dyspnea - Shortness of breath present since the fall. - Worsened by deep inspiration, coughing, or hiccups.  Cutaneous injury - Sustained a skin injury during the fall, described as a 'boo boo'. - Initially dark red and sore, now healing and improved.  Hypertension - Home blood pressure readings in the 130s/low 90s. - Previously on valsartan  and hydrochlorothiazide ; had stopped hydrochlorothiazide  but has resumed due to persistent elevated blood pressure.       12/16/2023   10:47 AM 08/27/2023   11:17 AM 05/26/2023   10:43 AM 02/28/2023    8:50 AM 11/26/2022   10:06 AM  Depression screen PHQ 2/9  Decreased Interest 0 0 0 1   Down, Depressed, Hopeless 0 0 0 2 0  PHQ - 2 Score 0 0 0 3 0  Altered sleeping   0 3 0  Tired, decreased energy   0 1 0  Change in appetite   0 2 1  Feeling bad or failure about yourself    0 0 0  Trouble concentrating   0 0 0   Moving slowly or fidgety/restless   0 1 0  Suicidal thoughts   0 0 0  PHQ-9 Score   0  10  1   Difficult doing work/chores   Not difficult at all Somewhat difficult Not difficult at all     Data saved with a previous flowsheet row definition        12/16/2023   10:40 AM  Fall Risk   Falls in the past year? 0  Number falls in past yr: 0  Injury with Fall? 0   Risk for fall due to : History of fall(s)  Follow up Falls evaluation completed     Data saved with a previous flowsheet row definition    Patient Care Team: Sherre Clapper, MD as PCP - General (Family Medicine) Larnell Purchase, MD as Referring Physician (Orthopedic Surgery) Burt Fus, DPM as Consulting Physician (Podiatry) Darcel Pool, MD as Consulting Physician (Obstetrics and Gynecology) Erasmo Bernardino BRAVO, OD (Optometry) Gust Royden ORN, MD (Orthopedic Surgery) Roney, Jenna, OD as Referring Physician (Optometry)   Review of Systems  Constitutional:  Negative for chills, fatigue and fever.  HENT:  Negative for congestion, ear pain and sore throat.   Respiratory:  Positive for shortness of breath. Negative for cough.   Cardiovascular:  Negative for chest pain.    Medications Ordered Prior to Encounter[1] Past Medical History:  Diagnosis Date   Allergy    Anxiety  Arthritis    Bradycardia, unspecified    Bruises easily    Calf pain    when walking   Fibromyalgia    Frequent headaches    GERD (gastroesophageal reflux disease)    H/O bladder problems    Hypertension    IBS (irritable bowel syndrome)    Localized edema    Mild persistent asthma, uncomplicated    Morbid (severe) obesity due to excess calories (HCC)    Muscle pain    Other hypotension    Ovarian cyst    Slow transit constipation    Solitary pulmonary nodule    Swelling    feet and legs   Type 2 diabetes mellitus with diabetic nephropathy (HCC)    Varicose veins of bilateral lower extremities with pain    Past Surgical History:   Procedure Laterality Date   BUNIONECTOMY     left foot 5th toe   CARDIAC CATHETERIZATION     carpel tunnel surgery     CESAREAN SECTION     CHOLECYSTECTOMY     GALLBLADDER SURGERY     TONSILLECTOMY     TUBAL LIGATION     VAGINAL HYSTERECTOMY  2003   LAVH/SR    Family History  Problem Relation Age of Onset   Heart disease Father    Hypertension Father    Stroke Father    Heart attack Father    Cerebrovascular Accident Father    Diabetes Sister    Kidney disease Sister    Colon cancer Maternal Aunt    Breast cancer Maternal Aunt    Cancer Maternal Aunt 77       breast   Migraines Daughter    Colon polyps Neg Hx    Esophageal cancer Neg Hx    Rectal cancer Neg Hx    Stomach cancer Neg Hx    Social History   Socioeconomic History   Marital status: Widowed    Spouse name: Janashia Parco   Number of children: 1   Years of education: Not on file   Highest education level: 12th grade  Occupational History   Occupation: Disabled due to foot problems since 1993  Tobacco Use   Smoking status: Never   Smokeless tobacco: Never  Vaping Use   Vaping status: Never Used  Substance and Sexual Activity   Alcohol use: No   Drug use: No   Sexual activity: Not Currently    Partners: Male    Birth control/protection: Surgical    Comment: hyst  Other Topics Concern   Not on file  Social History Narrative   Not on file   Social Drivers of Health   Tobacco Use: Low Risk (02/09/2024)   Patient History    Smoking Tobacco Use: Never    Smokeless Tobacco Use: Never    Passive Exposure: Not on file  Financial Resource Strain: Medium Risk (01/05/2024)   Overall Financial Resource Strain (CARDIA)    Difficulty of Paying Living Expenses: Somewhat hard  Food Insecurity: Food Insecurity Present (01/05/2024)   Epic    Worried About Programme Researcher, Broadcasting/film/video in the Last Year: Sometimes true    Ran Out of Food in the Last Year: Never true  Transportation Needs: No Transportation Needs  (01/05/2024)   Epic    Lack of Transportation (Medical): No    Lack of Transportation (Non-Medical): No  Physical Activity: Sufficiently Active (01/05/2024)   Exercise Vital Sign    Days of Exercise per Week: 7 days    Minutes  of Exercise per Session: 30 min  Stress: Stress Concern Present (01/05/2024)   [employer] of Occupational Health - Occupational Stress Questionnaire    Feeling of Stress: To some extent  Social Connections: Socially Isolated (01/05/2024)   Social Connection and Isolation Panel    Frequency of Communication with Friends and Family: More than three times a week    Frequency of Social Gatherings with Friends and Family: Three times a week    Attends Religious Services: Never    Active Member of Clubs or Organizations: No    Attends Banker Meetings: Not on file    Marital Status: Widowed  Depression (PHQ2-9): Low Risk (12/16/2023)   Depression (PHQ2-9)    PHQ-2 Score: 0  Alcohol Screen: Low Risk (06/05/2022)   Alcohol Screen    Last Alcohol Screening Score (AUDIT): 0  Housing: Low Risk (01/05/2024)   Epic    Unable to Pay for Housing in the Last Year: No    Number of Times Moved in the Last Year: 0    Homeless in the Last Year: No  Utilities: Not At Risk (12/16/2023)   Epic    Threatened with loss of utilities: No  Health Literacy: Adequate Health Literacy (12/16/2023)   B1300 Health Literacy    Frequency of need for help with medical instructions: Never    Objective:  BP 130/74   Pulse 86   Temp 98.4 F (36.9 C)   Ht 5' 2 (1.575 m)   Wt 195 lb 12.8 oz (88.8 kg)   SpO2 94%   BMI 35.81 kg/m      02/09/2024   10:47 AM 01/06/2024   10:49 AM 12/16/2023    2:48 PM  BP/Weight  Systolic BP 130 138 110  Diastolic BP 74 82 78  Wt. (Lbs) 195.8 200.8 198.25  BMI 35.81 kg/m2 36.73 kg/m2 36.26 kg/m2    Physical Exam Vitals reviewed.  Constitutional:      Appearance: Normal appearance.  Cardiovascular:     Rate and Rhythm:  Normal rate and regular rhythm.     Heart sounds: Normal heart sounds.     Comments: Guarding with inspiration. Tender  over lower right rib cage.  Pulmonary:     Effort: Pulmonary effort is normal.     Breath sounds: Normal breath sounds.  Neurological:     Mental Status: She is alert.         Lab Results  Component Value Date   WBC 8.0 10/31/2023   HGB 14.4 10/31/2023   HCT 44.7 10/31/2023   PLT 323 10/31/2023   GLUCOSE 85 10/31/2023   CHOL 198 05/26/2023   TRIG 115 05/26/2023   HDL 68 05/26/2023   LDLCALC 110 (H) 05/26/2023   ALT 13 10/31/2023   AST 18 10/31/2023   NA 143 10/31/2023   K 3.8 10/31/2023   CL 105 10/31/2023   CREATININE 0.88 10/31/2023   BUN 16 10/31/2023   CO2 25 10/31/2023   TSH 0.478 10/31/2023   HGBA1C 6.3 12/16/2023    Results for orders placed or performed in visit on 12/16/23  POCT glycosylated hemoglobin (Hb A1C)   Collection Time: 12/16/23 10:34 AM  Result Value Ref Range   Hemoglobin A1C     HbA1c POC (<> result, manual entry) 6.3 4.0 - 5.6 %   HbA1c, POC (prediabetic range)     HbA1c, POC (controlled diabetic range)    POCT Lipid Panel   Collection Time: 12/16/23 10:35 AM  Result Value Ref  Range   TC 147    HDL 59    TRG 139    LDL 60    Non-HDL 88    TC/HDL 1.0   .  Assessment & Plan:   Assessment & Plan Acute right-sided thoracic back pain evere pain with risk of pneumonia due to shallow breathing. No rib fractures or internal bleeding. - Continue tramadol  for pain management. - Use ice for pain relief. - Encouraged deep breathing exercises to prevent pneumonia. - Added over-the-counter ibuprofen 200 mg, three times a day, for additional pain relief. Superficial skin laceration Healing with reduced soreness.    Essential hypertension, benign Recent blood pressure readings of 130/74 mmHg in the office and 130s/low 90s at home. Previous valsartan  dose was increased to 160 mg. Hydrochlorothiazide  was restarted due to  suboptimal blood pressure control. - Increased valsartan  to 160 mg daily. - Discontinued hydrochlorothiazide . - Scheduled follow-up appointment in March.      Body mass index is 35.81 kg/m.     Meds ordered this encounter  Medications   valsartan  (DIOVAN ) 160 MG tablet    Sig: Take 1 tablet (160 mg total) by mouth daily.    Dispense:  90 tablet    Refill:  1    No orders of the defined types were placed in this encounter.      Follow-up: Return in about 3 months (around 05/09/2024).  An After Visit Summary was printed and given to the patient.  Abigail Free, MD [facility] [phone]     [1]  Current Outpatient Medications on File Prior to Visit  Medication Sig Dispense Refill   ALPRAZolam  (XANAX ) 0.5 MG tablet TAKE 1/2 TO 1 TABLET by mouth EVERY 8 HOURS AS NEEDED 30 tablet 5   Biotin 89999 MCG TABS Take 1 tablet by mouth daily.     DULoxetine  (CYMBALTA ) 60 MG capsule Take 1 capsule (60 mg total) by mouth daily. 90 capsule 1   empagliflozin  (JARDIANCE ) 10 MG TABS tablet Take 1 tablet (10 mg total) by mouth daily before breakfast. 90 tablet 1   Fluticasone-Umeclidin-Vilant (TRELEGY ELLIPTA ) 100-62.5-25 MCG/ACT AEPB Inhale 1 Inhalation into the lungs daily. 60 each 3   linaclotide  (LINZESS ) 72 MCG capsule Take 1 capsule (72 mcg total) by mouth daily before breakfast.     loratadine  (CLARITIN ) 10 MG tablet Take 1 tablet (10 mg total) by mouth daily. 90 tablet 3   meclizine  (ANTIVERT ) 25 MG tablet TAKE 1 TABLET TWICE A DAY 180 tablet 1   Multiple Vitamin (MULTIVITAMIN) tablet Take 1 tablet by mouth daily.     nystatin  (MYCOSTATIN ) 100000 UNIT/ML suspension SWISH AND SPIT 5ML BY MOUTH4 TIMES A DAY AS NEEDED      (THRUSH) 120 mL 2   omeprazole  (PRILOSEC) 40 MG capsule Take 1 capsule (40 mg total) by mouth 2 (two) times daily. 180 capsule 1   ondansetron  (ZOFRAN -ODT) 4 MG disintegrating tablet Take 1 tablet (4 mg total) by mouth every 8 (eight) hours as needed for  nausea or vomiting. 30 tablet 0   pravastatin  (PRAVACHOL ) 40 MG tablet TAKE 1 TABLET DAILY 90 tablet 1   traMADol  (ULTRAM ) 50 MG tablet Take 1 tablet (50 mg total) by mouth 3 (three) times daily as needed. 90 tablet 1   valACYclovir  (VALTREX ) 1000 MG tablet Take 2 tablets (2,000 mg total) by mouth 2 (two) times daily as needed (x 1 day for each break out). 20 tablet 0   No current facility-administered medications on file  prior to visit.   "

## 2024-02-19 ENCOUNTER — Telehealth: Payer: Self-pay | Admitting: Gastroenterology

## 2024-02-19 MED ORDER — LINACLOTIDE 290 MCG PO CAPS: 290.0000 ug | ORAL_CAPSULE | Freq: Every day | ORAL | 0 refills | Status: AC

## 2024-02-19 NOTE — Telephone Encounter (Signed)
 Spoke with patient to advised that we will send a prescription of Linzess  290 mcg for her to take once daily since it appears that 145 mcg and 72 mcg is ineffective. Additionally, advised that she should follow up in the office. Patient verbalizes understanding and has scheduled an appointment with Deanna May, NP on 03/05/24 at 1130 am.

## 2024-02-19 NOTE — Telephone Encounter (Signed)
 Inbound call from patient stating that she will not be able to coming tomorrow due to her falling and is not wanting to risk falling again. Patient is wanting to make Bayley aware that whenever she takes medication of 145 mg she has a bowel movement 2 days later and when she take the 72 mg she does not have a bowel movement until a week after. Patient is wanting Bayley to call in both medication to [facility] if possible if not then to please call in the 145 mg. Patient did not recall the name of the medication. Please advise.

## 2024-02-20 ENCOUNTER — Ambulatory Visit: Admitting: Gastroenterology

## 2024-02-20 ENCOUNTER — Telehealth: Payer: Self-pay

## 2024-02-20 DIAGNOSIS — K219 Gastro-esophageal reflux disease without esophagitis: Secondary | ICD-10-CM

## 2024-02-20 MED ORDER — MECLIZINE HCL 25 MG PO TABS: 25.0000 mg | ORAL_TABLET | Freq: Two times a day (BID) | ORAL | 1 refills | Status: AC

## 2024-02-20 MED ORDER — OMEPRAZOLE 40 MG PO CPDR: 40.0000 mg | DELAYED_RELEASE_CAPSULE | Freq: Two times a day (BID) | ORAL | 1 refills | Status: AC

## 2024-02-20 NOTE — Telephone Encounter (Signed)
 Copied from CRM #[id number]. Topic: Clinical - Medication Refill >> Feb 20, 2024  9:51 AM Emylou G wrote: Medication:   omeprazole  (PRILOSEC) 40 MG capsule meclizine  (ANTIVERT ) 25 MG tablet   Has the patient contacted their pharmacy? Yes (Agent: If no, request that the patient contact the pharmacy for the refill. If patient does not wish to contact the pharmacy document the reason why and proceed with request.) (Agent: If yes, when and what did the pharmacy advise?) said to call us   This is the patient's preferred pharmacy:  [facility] [address] - [place], [place] - [address] [place] [address] Phone: [phone] Fax: [phone]  Is this the correct pharmacy for this prescription? Yes If no, delete pharmacy and type the correct one.   Has the prescription been filled recently? No  Is the patient out of the medication? Yes  Has the patient been seen for an appointment in the last year OR does the patient have an upcoming appointment? Yes  Can we respond through MyChart? Yes  Agent: Please be advised that Rx refills may take up to 3 business days. We ask that you follow-up with your pharmacy.

## 2024-03-05 ENCOUNTER — Ambulatory Visit: Admitting: Gastroenterology

## 2024-04-20 ENCOUNTER — Ambulatory Visit: Admitting: Family Medicine

## 2024-06-15 ENCOUNTER — Ambulatory Visit: Admitting: Family Medicine

## 2024-06-24 ENCOUNTER — Ambulatory Visit
